# Patient Record
Sex: Female | Born: 1965 | Race: White | Hispanic: No | Smoking: Former smoker
Health system: Southern US, Community
[De-identification: ages and names within clinical notes are randomized; demographics above are authoritative.]

## PROBLEM LIST (undated history)

## (undated) DIAGNOSIS — M5430 Sciatica, unspecified side: Secondary | ICD-10-CM

## (undated) DIAGNOSIS — G4733 Obstructive sleep apnea (adult) (pediatric): Secondary | ICD-10-CM

## (undated) DIAGNOSIS — E785 Hyperlipidemia, unspecified: Secondary | ICD-10-CM

## (undated) DIAGNOSIS — D649 Anemia, unspecified: Secondary | ICD-10-CM

## (undated) DIAGNOSIS — E669 Obesity, unspecified: Secondary | ICD-10-CM

## (undated) DIAGNOSIS — M545 Low back pain, unspecified: Secondary | ICD-10-CM

## (undated) DIAGNOSIS — G43909 Migraine, unspecified, not intractable, without status migrainosus: Secondary | ICD-10-CM

## (undated) DIAGNOSIS — I639 Cerebral infarction, unspecified: Secondary | ICD-10-CM

## (undated) DIAGNOSIS — G8929 Other chronic pain: Secondary | ICD-10-CM

## (undated) DIAGNOSIS — F319 Bipolar disorder, unspecified: Secondary | ICD-10-CM

## (undated) DIAGNOSIS — F329 Major depressive disorder, single episode, unspecified: Secondary | ICD-10-CM

## (undated) DIAGNOSIS — J45909 Unspecified asthma, uncomplicated: Secondary | ICD-10-CM

## (undated) DIAGNOSIS — Z9989 Dependence on other enabling machines and devices: Secondary | ICD-10-CM

## (undated) DIAGNOSIS — E119 Type 2 diabetes mellitus without complications: Secondary | ICD-10-CM

## (undated) DIAGNOSIS — F410 Panic disorder [episodic paroxysmal anxiety] without agoraphobia: Secondary | ICD-10-CM

## (undated) DIAGNOSIS — F32A Depression, unspecified: Secondary | ICD-10-CM

## (undated) DIAGNOSIS — Z9289 Personal history of other medical treatment: Secondary | ICD-10-CM

## (undated) DIAGNOSIS — I10 Essential (primary) hypertension: Secondary | ICD-10-CM

---
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
## 2015-02-06 ENCOUNTER — Encounter (HOSPITAL_COMMUNITY): Payer: Self-pay

## 2015-02-06 ENCOUNTER — Emergency Department (HOSPITAL_COMMUNITY)
Admission: EM | Admit: 2015-02-06 | Discharge: 2015-02-07 | Disposition: A | Discharge status: Home / Self Care | Attending: Emergency Medicine | Admitting: Emergency Medicine

## 2015-02-06 DIAGNOSIS — I10 Essential (primary) hypertension: Secondary | ICD-10-CM | POA: Diagnosis not present

## 2015-02-06 DIAGNOSIS — M545 Low back pain, unspecified: Secondary | ICD-10-CM

## 2015-02-06 DIAGNOSIS — Z8659 Personal history of other mental and behavioral disorders: Secondary | ICD-10-CM | POA: Insufficient documentation

## 2015-02-06 DIAGNOSIS — Z8639 Personal history of other endocrine, nutritional and metabolic disease: Secondary | ICD-10-CM | POA: Insufficient documentation

## 2015-02-06 HISTORY — DX: Major depressive disorder, single episode, unspecified: F32.9

## 2015-02-06 HISTORY — DX: Hyperlipidemia, unspecified: E78.5

## 2015-02-06 HISTORY — DX: Essential (primary) hypertension: I10

## 2015-02-06 HISTORY — DX: Depression, unspecified: F32.A

## 2015-02-06 NOTE — ED Notes (Signed)
Patient reports chronic low back pain, that has worsened over the past two weeks.

## 2015-02-07 MED ORDER — MELOXICAM 15 MG PO TABS: 15.0000 mg | ORAL_TABLET | Freq: Every day | ORAL | Status: DC

## 2015-02-07 MED ORDER — NAPROXEN 500 MG PO TABS
500.0000 mg | ORAL_TABLET | Freq: Once | ORAL | Status: AC
  Administered 2015-02-07: 500 mg via ORAL
  Filled 2015-02-07: qty 1

## 2015-02-07 MED ORDER — METHOCARBAMOL 500 MG PO TABS
500.0000 mg | ORAL_TABLET | Freq: Once | ORAL | Status: AC
  Administered 2015-02-07: 500 mg via ORAL
  Filled 2015-02-07: qty 1

## 2015-02-07 MED ORDER — METHOCARBAMOL 500 MG PO TABS: 500.0000 mg | ORAL_TABLET | Freq: Two times a day (BID) | ORAL | Status: DC

## 2015-02-07 NOTE — ED Provider Notes (Signed)
CSN: [id number]     Arrival date & time 02/06/15  2251 History   First MD Initiated Contact with Patient 02/07/15 0007     Chief Complaint  Patient presents with  . Back Pain     (Consider location/radiation/quality/duration/timing/severity/associated sxs/prior Treatment) HPI Comments: Patient is a 49 year old female with a past medical history of chronic back pain who presents with gradual onset of lower back pain that started this morning. The pain is aching and severe and does not radiate. The pain is constant. Movement makes the pain worse. Nothing makes the pain better. Patient was moving heavy furniture today. Patient has not tried anything for pain. No associated symptoms. No saddle paresthesias or bladder/bowel incontinence.      Past Medical History  Diagnosis Date  . Hypertension   . Depression   . Hyperlipemia    History reviewed. No pertinent past surgical history. No family history on file. History  Substance Use Topics  . Smoking status: Never Smoker   . Smokeless tobacco: Not on file  . Alcohol Use: No   OB History    No data available     Review of Systems  Musculoskeletal: Positive for back pain.  All other systems reviewed and are negative.     Allergies  Ibuprofen  Home Medications   Prior to Admission medications   Not on File   BP 151/92 mmHg  Pulse 110  Temp(Src) 97.7 F (36.5 C) (Oral)  Resp 16  SpO2 95%  LMP 01/14/2015 (Approximate) Physical Exam  Constitutional: She is oriented to person, place, and time. She appears well-developed and well-nourished. No distress.  HENT:  Head: Normocephalic and atraumatic.  Eyes: Conjunctivae are normal.  Cardiovascular: Normal rate and regular rhythm.  Exam reveals no gallop and no friction rub.   No murmur heard. Pulmonary/Chest: Effort normal and breath sounds normal. She has no wheezes. She has no rales. She exhibits no tenderness.  Abdominal: Soft. She exhibits no distension. There is no  tenderness.  Musculoskeletal: Normal range of motion.  No midline spine tenderness to palpation. Bilateral paraspinal lumbar tenderness to palpation.   Neurological: She is alert and oriented to person, place, and time. Coordination normal.  Extremity strength and sensation equal and intact bilaterally. Speech is goal-oriented. Moves limbs without ataxia.   Skin: Skin is warm and dry.  Psychiatric: She has a normal mood and affect. Her behavior is normal.  Nursing note and vitals reviewed.   ED Course  Procedures (including critical care time) Labs Review Labs Reviewed - No data to display  Imaging Review No results found.   EKG Interpretation None      MDM   Final diagnoses:  Bilateral low back pain without sciatica    12:50 AM Patient likely has muscle spasm of lower back from heavy lifting. Vitals stable and patient afebrile. No bladder/bowel incontinence or saddle paresthesias.    Emilia Beck, PA-C 02/07/15 0124  April Palumbo, MD 02/07/15 [phone]

## 2015-02-07 NOTE — Discharge Instructions (Signed)
Take mobic as needed for pain. Take robaxin as needed for muscle spasm. Refer to attached documents for more information. Follow up with your doctor for further evaluation.

## 2015-02-07 NOTE — ED Notes (Signed)
Pt reports chronic low back pain.  Pt states they have been moving. Pt is ambulatory without difficulty.

## 2015-03-31 DIAGNOSIS — Z8639 Personal history of other endocrine, nutritional and metabolic disease: Secondary | ICD-10-CM | POA: Insufficient documentation

## 2015-03-31 DIAGNOSIS — Z79899 Other long term (current) drug therapy: Secondary | ICD-10-CM | POA: Diagnosis not present

## 2015-03-31 DIAGNOSIS — E669 Obesity, unspecified: Secondary | ICD-10-CM | POA: Diagnosis not present

## 2015-03-31 DIAGNOSIS — I10 Essential (primary) hypertension: Secondary | ICD-10-CM | POA: Insufficient documentation

## 2015-03-31 DIAGNOSIS — M545 Low back pain: Secondary | ICD-10-CM | POA: Insufficient documentation

## 2015-03-31 DIAGNOSIS — J45909 Unspecified asthma, uncomplicated: Secondary | ICD-10-CM | POA: Diagnosis not present

## 2015-03-31 DIAGNOSIS — R202 Paresthesia of skin: Secondary | ICD-10-CM | POA: Insufficient documentation

## 2015-03-31 DIAGNOSIS — Z8659 Personal history of other mental and behavioral disorders: Secondary | ICD-10-CM | POA: Diagnosis not present

## 2015-03-31 DIAGNOSIS — Z791 Long term (current) use of non-steroidal anti-inflammatories (NSAID): Secondary | ICD-10-CM | POA: Diagnosis not present

## 2015-04-01 ENCOUNTER — Emergency Department (HOSPITAL_COMMUNITY)
Admission: EM | Admit: 2015-04-01 | Discharge: 2015-04-01 | Disposition: A | Discharge status: Home / Self Care | Attending: Emergency Medicine | Admitting: Emergency Medicine

## 2015-04-01 ENCOUNTER — Encounter (HOSPITAL_COMMUNITY): Payer: Self-pay | Admitting: Emergency Medicine

## 2015-04-01 DIAGNOSIS — M549 Dorsalgia, unspecified: Secondary | ICD-10-CM

## 2015-04-01 HISTORY — DX: Obesity, unspecified: E66.9

## 2015-04-01 HISTORY — DX: Sciatica, unspecified side: M54.30

## 2015-04-01 HISTORY — DX: Unspecified asthma, uncomplicated: J45.909

## 2015-04-01 MED ORDER — OXYCODONE-ACETAMINOPHEN 5-325 MG PO TABS: 1.0000 | ORAL_TABLET | ORAL | Status: DC | PRN

## 2015-04-01 MED ORDER — MORPHINE SULFATE 4 MG/ML IJ SOLN
6.0000 mg | Freq: Once | INTRAMUSCULAR | Status: DC
  Filled 2015-04-01: qty 2

## 2015-04-01 MED ORDER — OXYCODONE-ACETAMINOPHEN 5-325 MG PO TABS
2.0000 | ORAL_TABLET | Freq: Once | ORAL | Status: AC
  Administered 2015-04-01: 2 via ORAL
  Filled 2015-04-01: qty 2

## 2015-04-01 MED ORDER — METHOCARBAMOL 500 MG PO TABS: 500.0000 mg | ORAL_TABLET | Freq: Two times a day (BID) | ORAL | Status: DC

## 2015-04-01 NOTE — ED Notes (Signed)
Pt refusing morphine injection at this time; states "I think I've taken that before and it makes my heart race." PA made aware

## 2015-04-01 NOTE — Discharge Instructions (Signed)
Take the prescribed medication as directed.  Continue supportive care at home (heat, ice, etc) to help with pain. Follow-up with your primary care physician. Return to the ED for new or worsening symptoms.   Emergency Department Resource Guide 1) Find a Doctor and Pay Out of Pocket Although you won't have to find out who is covered by your insurance plan, it is a good idea to ask around and get recommendations. You will then need to call the office and see if the doctor you have chosen will accept you as a new patient and what types of options they offer for patients who are self-pay. Some doctors offer discounts or will set up payment plans for their patients who do not have insurance, but you will need to ask so you aren't surprised when you get to your appointment.  2) Contact Your Local Health Department Not all health departments have doctors that can see patients for sick visits, but many do, so it is worth a call to see if yours does. If you don't know where your local health department is, you can check in your phone book. The CDC also has a tool to help you locate your state's health department, and many state websites also have listings of all of their local health departments.  3) Find a Walk-in Clinic If your illness is not likely to be very severe or complicated, you may want to try a walk in clinic. These are popping up all over the country in pharmacies, drugstores, and shopping centers. They're usually staffed by nurse practitioners or physician assistants that have been trained to treat common illnesses and complaints. They're usually fairly quick and inexpensive. However, if you have serious medical issues or chronic medical problems, these are probably not your best option.  No Primary Care Doctor: - Call Health Connect at  [phone] - they can help you locate a primary care doctor that  accepts your insurance, provides certain services, etc. - Physician Referral Service-  [phone]  Chronic Pain Problems: Organization         Address  Phone   Notes  [facility]  [phone] Patients need to be referred by their primary care doctor.   Medication Assistance: Organization         Address  Phone   Notes  [facility] Medication [facility] [address] [place], [place] [address] [phone] --Must be a resident of [facility] -- Must have NO insurance coverage whatsoever (no Medicaid/ Medicare, etc.) -- The pt. MUST have a primary care doctor that directs their care regularly and follows them in the community   Med[facility]ist  [phone]   [employer]  [phone]    Agencies that provide inexpensive medical care: Organization         Address  Phone   Notes  [facility]  [phone]   [facility]    [phone]   [facility] [address] [place], [place] [address] [phone]   [facility] [address] [place], [place] [phone]   Planned Parenthood    [phone]   [facility]    [phone]   [facility] [employer]  [address], [place] Phone:  [phone], Fax:  [phone] Hours of Operation:  9 am - 6 pm, M-F.  Also accepts Medicaid/Medicare and self-pay.  [facility] for  Children  [address], [place] Phone: [phone], Fax: [phone]. Hours of Operation:  8:30 am - 5:30 pm, M-F.  Also accepts Medicaid and self-pay.  [facility] [place] [address] [place] Phone: [phone]   [place], [place], [place] [phone], Ext. [phone] Mondays & Thursdays: 7-9 AM.  First 15 patients are seen on a first come, first serve basis.    [place] Providers:  Organization         Address  Phone   Notes  [facility] [address],  [place] [phone] Also accepts self-pay patients.  [facility] [id number] [address], [place]  [phone]   [place], [address], [place] [phone]   [facility] [address] [place] [phone]   Lucianne Lei [address], [place]   [phone] Only accepts [place] Access [place] patients after they have their name applied to their card.   Self-Pay (no insurance) in [facility]:  Organization         Address  Phone   Notes  Sickle Cell Patients, [facility] Internal Medicine [place] [phone]   [facility] Urgent Care [place] [phone]   [facility] [place]  [place], [place], [place] [phone]   [facility]/Dr. Osei-Bonsu  [address] [place] or [address], [place] [phone] Phone number for both [place] and [place] locations is the same.  [facility] [address] [place] [phone]   [facility] [address] [place] or [address] [place] [address] [phone]   [facility] [address] [place] [phone], phone; [phone], fax Sees patients 1st and 3rd Saturday of every month.  Must not qualify for public or private insurance (i.e. Medicaid, Medicare, [place] Health Choice, Veterans' Benefits)  Household income should be no more than 200% of the poverty level The clinic cannot treat you if you are pregnant or think you are pregnant  Sexually transmitted diseases are not treated at the clinic.    Dental Care: Organization         Address  Phone  Notes  [facility] Department of [facility] [phone] Accepts children up to age 64 who are enrolled in  [place] or [place]; pregnant women with a Medicaid card; and children who have applied for Medicaid or [place] Health Choice, but were declined, whose parents can pay a reduced fee at time of service.  [facility] Department of [facility]  [address], [place] [phone] Accepts children up to age 59 who are enrolled in [place] or [place]; pregnant women with a Medicaid card; and children who have applied for Medicaid or [place] Health Choice, but were declined, whose parents can pay a reduced fee at time of service.  [facility]  [place] [phone] Patients are seen by appointment only. Walk-ins are not accepted. [place] will see patients 70 years of age and older. Monday - Tuesday (8am-5pm) Most Wednesdays (8:30-5pm) $30 per visit, cash only  [facility] [facility]  [address], [facility] [phone] Patients are seen by appointment only. Walk-ins are not accepted. [place] will see patients 55 years of age and older. One Wednesday Evening (Monthly: Volunteer Based).  $30 per visit, cash only  [place]  [phone] for adults; Children under age 16, call Graduate Pediatric Dentistry at [phone]. Children aged 15-14, please call [phone] to request a pediatric application.  Dental services are provided in all areas of dental care including fillings, crowns and bridges, complete and partial dentures, implants, gum treatment, root canals, and extractions. Preventive care is also provided. Treatment is provided to both adults and children. Patients are selected via a lottery and there is often a waiting list.   [facility] [address] [place]  [phone] [email or web address]   [facility] [address] [place], [place] [phone], Ext. [phone] Second and Fourth Thursday of each month, opens at 6:30  AM; Clinic ends at 9 AM.  Patients are seen on a first-come first-served basis, and a limited number are seen during each clinic.   [facility]  [address] Hillard Danker [place], [place] [phone]   Eligibility Requirements You must have lived in [place], [place], or [place] counties for at least the last three months.   You cannot be eligible for state or federal sponsored [employer], including [employer], [place], or [employer].   You generally cannot be eligible for healthcare insurance through your employer.    How to apply: Eligibility screenings are held every Tuesday and Wednesday afternoon from 1:00 pm until 4:00 pm. You do not need an appointment for the interview!  [facility] [address] [place], [place]   [employer]  [place] Department  [place]  [phone]    Behavioral Health Resources in the Community: Intensive Outpatient Programs Organization         Address  Phone  Notes  [place]. [address], [place], [place] [phone]   [facility] [address], [place], [employer]   ADS: Alcohol & Drug S[facility] [address], [place], [place]   [place] [address]  [place], [place] or [phone]   Substance Abuse Resources Organization         Address  Phone  Notes  Alcohol and Drug Services  [phone]   [place]  [phone]   [facility] [place]   Chinita Pester  [phone]   Residential & Outpatient Substance Abuse Program  [phone]   Psychological Services Organization         Address  Phone  Notes  [facility] [place]  [place]  [phone]   [place] [address] [place] [phone] or  [phone]    Mobile Crisis Teams Organization         Address  Phone  Notes  [facility], Mobile Crisis Care Unit  [phone]   [facility]  [address] [place], [place]   [place] [address] [place] [phone]    Self-Help/Support Groups Organization         Address  Phone             Notes  [facility]ental [facility]oc[facility] of [place] - variety of support groups  [place] Call for more information  Narcotics Anonymous (NA), [facility]aring [facility] [address], [employer] [place]  2 meetings at this location   Special educational needs teacher         Address  Phone  Notes  [facility] [place],    [place]  [phone]   [facility]  [address], [place] [address], [place], [place]   [place], [place] [phone] Admissions: 8am-3pm M-F  Incentives Substance [place] [address],    [place], [place] [phone]   [facility] [address] [place], [place], [place]   [facility] [address],  [place], [place]   [facility] - Intensive Outpatient [address], [place], [place]   [facility] ([place].) [place].,  [place], [place] [phone] or [phone]   [facility] (RTS) [address] [place], [place] Accepts Medicaid  [facility] [place] [address]  [place] [phone] Substance Abuse/Addiction Treatment   [facility] Organization         Address  Phone  Notes  [facility]  [phone]   Domenic Schwab, PhD [address] Arlis Porta [place], [place]   [phone] or [phone]   [place] Per[place], [place] [phone]   [facility] [address],  [place], [place] [phone] Insurance/Medicaid/sponsorship through [facility] and Families [address], [address]                                    [place], [place] [phone] [place] [address], [place] [phone]    Dr. Adele Schilder  [phone]   [facility] Dept. 1) [address] [place] 2) [place] 3)  [place] [address], [place] [phone]  [phone]   [place] [phone] or [phone] (After Hours)

## 2015-04-01 NOTE — ED Notes (Signed)
Pt. reports chronic low back pain ( sciatica) worse this week radiating to right upper leg unrelieved by prescription medication  , denies injury/ambulatory , pt. also reported mild headache .

## 2015-04-01 NOTE — ED Provider Notes (Signed)
CSN: [id number]     Arrival date & time 03/31/15  2356 History   First MD Initiated Contact with Patient 04/01/15 0005     Chief Complaint  Patient presents with  . Back Pain     (Consider location/radiation/quality/duration/timing/severity/associated sxs/prior Treatment) The history is provided by the patient and medical records.    This is a 49 year old female with past medical history significant for hypertension, depression, hyperlipidemia, asthma, sciatica, presenting to the ED for back pain. Patient states pain is worse along her right lower back. She has sharp, shooting pains into her right leg with paresthesias. She denies numbness or weakness. Pain exacerbated with weightbearing and ambulation. Denies new injuries, trauma, or falls.  She states she is having a hard time sleeping at night and getting comfortable due to pain.  She has been taking tramadol and naproxen without relief.  She also applied ice packs to her low back which helped somewhat.  No fever, chills, sweats.  VSS.  Past Medical History  Diagnosis Date  . Hypertension   . Depression   . Hyperlipemia   . Obesity   . Sciatica   . Asthma    History reviewed. No pertinent past surgical history. No family history on file. History  Substance Use Topics  . Smoking status: Never Smoker   . Smokeless tobacco: Not on file  . Alcohol Use: No   OB History    No data available     Review of Systems  Musculoskeletal: Positive for back pain.  All other systems reviewed and are negative.     Allergies  Asa and Ibuprofen  Home Medications   Prior to Admission medications   Medication Sig Start Date End Date Taking? Authorizing Provider  meloxicam (MOBIC) 15 MG tablet Take 1 tablet (15 mg total) by mouth daily. 02/07/15   Kaitlyn Szekalski, PA-C  methocarbamol (ROBAXIN) 500 MG tablet Take 1 tablet (500 mg total) by mouth 2 (two) times daily. 02/07/15   Kaitlyn Szekalski, PA-C   BP 156/80 mmHg  Pulse 106   Temp(Src) 98.1 F (36.7 C) (Oral)  Resp 20  Wt 288 lb (130.636 kg)  SpO2 98%  LMP 03/18/2015   Physical Exam  Constitutional: She is oriented to person, place, and time. She appears well-developed and well-nourished. No distress.  HENT:  Head: Normocephalic and atraumatic.  Mouth/Throat: Oropharynx is clear and moist.  Eyes: Conjunctivae and EOM are normal. Pupils are equal, round, and reactive to light.  Neck: Normal range of motion. Neck supple.  Cardiovascular: Normal rate, regular rhythm and normal heart sounds.   Pulmonary/Chest: Effort normal and breath sounds normal. No respiratory distress. She has no wheezes.  Musculoskeletal: Normal range of motion. She exhibits no edema.  Tenderness of right SI joint without bony deformity; full ROM maintained; + SLR on right, - SLR on left; normal strength and sensation of BLE; normal gait  Neurological: She is alert and oriented to person, place, and time.  Skin: Skin is warm and dry. She is not diaphoretic.  Psychiatric: She has a normal mood and affect.  Nursing note and vitals reviewed.   ED Course  Procedures (including critical care time) Labs Review Labs Reviewed - No data to display  Imaging Review No results found.   EKG Interpretation None      MDM   Final diagnoses:  Back pain, unspecified location   49 year old female here with acute exacerbation of her sciatica. Pain localized to right SI joint with radiation down right leg.  She endorses paresthesias but denies numbness or weakness. She remains ambulatory. She has a positive straight leg raise on right. She has no focal neurologic deficits to suggest cauda equina or other acute spinal pathology.  She apparently reported headache to triage nurse, however this was not mentioned to me during any point of ED visit.  Attempted to give IM injection, however patient refused.  Given dose of percocet here, will d/c home with same.  Encouraged close FU with PCP.  Discussed  plan with patient, he/she acknowledged understanding and agreed with plan of care.  Return precautions given for new or worsening symptoms.  Garlon Hatchet, PA-C 04/01/15 1031  Dione Booze, MD 04/01/15 [phone]

## 2015-06-26 ENCOUNTER — Emergency Department (HOSPITAL_COMMUNITY)
Admission: EM | Admit: 2015-06-26 | Discharge: 2015-06-26 | Disposition: A | Discharge status: Home / Self Care | Attending: Emergency Medicine | Admitting: Emergency Medicine

## 2015-06-26 ENCOUNTER — Encounter (HOSPITAL_COMMUNITY): Payer: Self-pay | Admitting: Emergency Medicine

## 2015-06-26 DIAGNOSIS — E669 Obesity, unspecified: Secondary | ICD-10-CM | POA: Diagnosis not present

## 2015-06-26 DIAGNOSIS — Z8659 Personal history of other mental and behavioral disorders: Secondary | ICD-10-CM | POA: Insufficient documentation

## 2015-06-26 DIAGNOSIS — Z3202 Encounter for pregnancy test, result negative: Secondary | ICD-10-CM | POA: Insufficient documentation

## 2015-06-26 DIAGNOSIS — R51 Headache: Secondary | ICD-10-CM | POA: Diagnosis not present

## 2015-06-26 DIAGNOSIS — I10 Essential (primary) hypertension: Secondary | ICD-10-CM | POA: Insufficient documentation

## 2015-06-26 DIAGNOSIS — J45909 Unspecified asthma, uncomplicated: Secondary | ICD-10-CM | POA: Insufficient documentation

## 2015-06-26 DIAGNOSIS — H538 Other visual disturbances: Secondary | ICD-10-CM | POA: Insufficient documentation

## 2015-06-26 DIAGNOSIS — Z791 Long term (current) use of non-steroidal anti-inflammatories (NSAID): Secondary | ICD-10-CM | POA: Diagnosis not present

## 2015-06-26 DIAGNOSIS — R112 Nausea with vomiting, unspecified: Secondary | ICD-10-CM | POA: Diagnosis not present

## 2015-06-26 DIAGNOSIS — E785 Hyperlipidemia, unspecified: Secondary | ICD-10-CM | POA: Diagnosis not present

## 2015-06-26 DIAGNOSIS — R519 Headache, unspecified: Secondary | ICD-10-CM

## 2015-06-26 DIAGNOSIS — M5431 Sciatica, right side: Secondary | ICD-10-CM | POA: Diagnosis not present

## 2015-06-26 LAB — URINE MICROSCOPIC-ADD ON

## 2015-06-26 LAB — URINALYSIS, ROUTINE W REFLEX MICROSCOPIC
BILIRUBIN URINE: NEGATIVE
Glucose, UA: NEGATIVE mg/dL
Hgb urine dipstick: NEGATIVE
KETONES UR: NEGATIVE mg/dL
NITRITE: NEGATIVE
Protein, ur: NEGATIVE mg/dL
Specific Gravity, Urine: 1.011 (ref 1.005–1.030)
UROBILINOGEN UA: 1 mg/dL (ref 0.0–1.0)
pH: 6 (ref 5.0–8.0)

## 2015-06-26 LAB — PREGNANCY, URINE: PREG TEST UR: NEGATIVE

## 2015-06-26 MED ORDER — METOCLOPRAMIDE HCL 5 MG/ML IJ SOLN: 10.0000 mg | Freq: Once | INTRAMUSCULAR | Status: DC

## 2015-06-26 MED ORDER — PREDNISONE 10 MG PO TABS: ORAL_TABLET | ORAL | Status: DC

## 2015-06-26 MED ORDER — CYCLOBENZAPRINE HCL 10 MG PO TABS: 10.0000 mg | ORAL_TABLET | Freq: Two times a day (BID) | ORAL | Status: DC | PRN

## 2015-06-26 MED ORDER — CYCLOBENZAPRINE HCL 10 MG PO TABS
5.0000 mg | ORAL_TABLET | Freq: Once | ORAL | Status: AC
  Administered 2015-06-26: 5 mg via ORAL
  Filled 2015-06-26: qty 1

## 2015-06-26 MED ORDER — DIPHENHYDRAMINE HCL 50 MG/ML IJ SOLN
25.0000 mg | Freq: Once | INTRAMUSCULAR | Status: AC
  Administered 2015-06-26: 25 mg via INTRAMUSCULAR
  Filled 2015-06-26: qty 1

## 2015-06-26 MED ORDER — KETOROLAC TROMETHAMINE 30 MG/ML IJ SOLN
60.0000 mg | Freq: Once | INTRAMUSCULAR | Status: AC
  Administered 2015-06-26: 60 mg via INTRAMUSCULAR
  Filled 2015-06-26: qty 2

## 2015-06-26 MED ORDER — METOCLOPRAMIDE HCL 5 MG/ML IJ SOLN
10.0000 mg | Freq: Once | INTRAMUSCULAR | Status: AC
  Administered 2015-06-26: 10 mg via INTRAMUSCULAR
  Filled 2015-06-26: qty 2

## 2015-06-26 NOTE — Discharge Instructions (Signed)
Headaches, Frequently Asked Questions °MIGRAINE HEADACHES °Q: What is migraine? What causes it? How can I treat it? °A: Generally, migraine headaches begin as a dull ache. Then they develop into a constant, throbbing, and pulsating pain. You may experience pain at the temples. You may experience pain at the front or back of one or both sides of the head. The pain is usually accompanied by a combination of: °· Nausea. °· Vomiting. °· Sensitivity to light and noise. °Some people (about 15%) experience an aura (see below) before an attack. The cause of migraine is believed to be chemical reactions in the brain. Treatment for migraine may include over-the-counter or prescription medications. It may also include self-help techniques. These include relaxation training and biofeedback.  °Q: What is an aura? °A: About 15% of people with migraine get an "aura". This is a sign of neurological symptoms that occur before a migraine headache. You may see wavy or jagged lines, dots, or flashing lights. You might experience tunnel vision or blind spots in one or both eyes. The aura can include visual or auditory hallucinations (something imagined). It may include disruptions in smell (such as strange odors), taste or touch. Other symptoms include: °· Numbness. °· A "pins and needles" sensation. °· Difficulty in recalling or speaking the correct word. °These neurological events may last as long as 60 minutes. These symptoms will fade as the headache begins. °Q: What is a trigger? °A: Certain physical or environmental factors can lead to or "trigger" a migraine. These include: °· Foods. °· Hormonal changes. °· Weather. °· Stress. °It is important to remember that triggers are different for everyone. To help prevent migraine attacks, you need to figure out which triggers affect you. Keep a headache diary. This is a good way to track triggers. The diary will help you talk to your healthcare professional about your condition. °Q: Does  weather affect migraines? °A: Bright sunshine, hot, humid conditions, and drastic changes in barometric pressure may lead to, or "trigger," a migraine attack in some people. But studies have shown that weather does not act as a trigger for everyone with migraines. °Q: What is the link between migraine and hormones? °A: Hormones start and regulate many of your body's functions. Hormones keep your body in balance within a constantly changing environment. The levels of hormones in your body are unbalanced at times. Examples are during menstruation, pregnancy, or menopause. That can lead to a migraine attack. In fact, about three quarters of all women with migraine report that their attacks are related to the menstrual cycle.  °Q: Is there an increased risk of stroke for migraine sufferers? °A: The likelihood of a migraine attack causing a stroke is very remote. That is not to say that migraine sufferers cannot have a stroke associated with their migraines. In persons under age 40, the most common associated factor for stroke is migraine headache. But over the course of a person's normal life span, the occurrence of migraine headache may actually be associated with a reduced risk of dying from cerebrovascular disease due to stroke.  °Q: What are acute medications for migraine? °A: Acute medications are used to treat the pain of the headache after it has started. Examples over-the-counter medications, NSAIDs, ergots, and triptans.  °Q: What are the triptans? °A: Triptans are the newest class of abortive medications. They are specifically targeted to treat migraine. Triptans are vasoconstrictors. They moderate some chemical reactions in the brain. The triptans work on receptors in your brain. Triptans help   to restore the balance of a neurotransmitter called serotonin. Fluctuations in levels of serotonin are thought to be a main cause of migraine.  °Q: Are over-the-counter medications for migraine effective? °A:  Over-the-counter, or "OTC," medications may be effective in relieving mild to moderate pain and associated symptoms of migraine. But you should see your caregiver before beginning any treatment regimen for migraine.  °Q: What are preventive medications for migraine? °A: Preventive medications for migraine are sometimes referred to as "prophylactic" treatments. They are used to reduce the frequency, severity, and length of migraine attacks. Examples of preventive medications include antiepileptic medications, antidepressants, beta-blockers, calcium channel blockers, and NSAIDs (nonsteroidal anti-inflammatory drugs). °Q: Why are anticonvulsants used to treat migraine? °A: During the past few years, there has been an increased interest in antiepileptic drugs for the prevention of migraine. They are sometimes referred to as "anticonvulsants". Both epilepsy and migraine may be caused by similar reactions in the brain.  °Q: Why are antidepressants used to treat migraine? °A: Antidepressants are typically used to treat people with depression. They may reduce migraine frequency by regulating chemical levels, such as serotonin, in the brain.  °Q: What alternative therapies are used to treat migraine? °A: The term "alternative therapies" is often used to describe treatments considered outside the scope of conventional Western medicine. Examples of alternative therapy include acupuncture, acupressure, and yoga. Another common alternative treatment is herbal therapy. Some herbs are believed to relieve headache pain. Always discuss alternative therapies with your caregiver before proceeding. Some herbal products contain arsenic and other toxins. °TENSION HEADACHES °Q: What is a tension-type headache? What causes it? How can I treat it? °A: Tension-type headaches occur randomly. They are often the result of temporary stress, anxiety, fatigue, or anger. Symptoms include soreness in your temples, a tightening band-like sensation  around your head (a "vice-like" ache). Symptoms can also include a pulling feeling, pressure sensations, and contracting head and neck muscles. The headache begins in your forehead, temples, or the back of your head and neck. Treatment for tension-type headache may include over-the-counter or prescription medications. Treatment may also include self-help techniques such as relaxation training and biofeedback. °CLUSTER HEADACHES °Q: What is a cluster headache? What causes it? How can I treat it? °A: Cluster headache gets its name because the attacks come in groups. The pain arrives with little, if any, warning. It is usually on one side of the head. A tearing or bloodshot eye and a runny nose on the same side of the headache may also accompany the pain. Cluster headaches are believed to be caused by chemical reactions in the brain. They have been described as the most severe and intense of any headache type. Treatment for cluster headache includes prescription medication and oxygen. °SINUS HEADACHES °Q: What is a sinus headache? What causes it? How can I treat it? °A: When a cavity in the bones of the face and skull (a sinus) becomes inflamed, the inflammation will cause localized pain. This condition is usually the result of an allergic reaction, a tumor, or an infection. If your headache is caused by a sinus blockage, such as an infection, you will probably have a fever. An x-ray will confirm a sinus blockage. Your caregiver's treatment might include antibiotics for the infection, as well as antihistamines or decongestants.  °REBOUND HEADACHES °Q: What is a rebound headache? What causes it? How can I treat it? °A: A pattern of taking acute headache medications too often can lead to a condition known as "rebound headache."   A pattern of taking too much headache medication includes taking it more than 2 days per week or in excessive amounts. That means more than the label or a caregiver advises. With rebound  headaches, your medications not only stop relieving pain, they actually begin to cause headaches. Doctors treat rebound headache by tapering the medication that is being overused. Sometimes your caregiver will gradually substitute a different type of treatment or medication. Stopping may be a challenge. Regularly overusing a medication increases the potential for serious side effects. Consult a caregiver if you regularly use headache medications more than 2 days per week or more than the label advises. ADDITIONAL QUESTIONS AND ANSWERS Q: What is biofeedback? A: Biofeedback is a self-help treatment. Biofeedback uses special equipment to monitor your body's involuntary physical responses. Biofeedback monitors:  Breathing.  Pulse.  Heart rate.  Temperature.  Muscle tension.  Brain activity. Biofeedback helps you refine and perfect your relaxation exercises. You learn to control the physical responses that are related to stress. Once the technique has been mastered, you do not need the equipment any more. Q: Are headaches hereditary? A: Four out of five (80%) of people that suffer report a family history of migraine. Scientists are not sure if this is genetic or a family predisposition. Despite the uncertainty, a child has a 50% chance of having migraine if one parent suffers. The child has a 75% chance if both parents suffer.  Q: Can children get headaches? A: By the time they reach high school, most young people have experienced some type of headache. Many safe and effective approaches or medications can prevent a headache from occurring or stop it after it has begun.  Q: What type of doctor should I see to diagnose and treat my headache? A: Start with your primary caregiver. Discuss his or her experience and approach to headaches. Discuss methods of classification, diagnosis, and treatment. Your caregiver may decide to recommend you to a headache specialist, depending upon your symptoms or other  physical conditions. Having diabetes, allergies, etc., may require a more comprehensive and inclusive approach to your headache. The National Head[employer] Foundation will provide, upon request, a list of [facility] physician members in your state. Document Released: 01/22/2004 Document Revised: 01/24/2012 Document Reviewed: 07/01/2008 [facility] Patient Information 2015 [place], [place]. This information is not intended to replace advice given to you by your health care provider. Make sure you discuss any questions you have with your health care provider.  Sciatica Sciatica is pain, weakness, numbness, or tingling along the path of the sciatic nerve. The nerve starts in the lower back and runs down the back of each leg. The nerve controls the muscles in the lower leg and in the back of the knee, while also providing sensation to the back of the thigh, lower leg, and the sole of your foot. Sciatica is a symptom of another medical condition. For instance, nerve damage or certain conditions, such as a herniated disk or bone spur on the spine, pinch or put pressure on the sciatic nerve. This causes the pain, weakness, or other sensations normally associated with sciatica. Generally, sciatica only affects one side of the body. CAUSES   Herniated or slipped disc.  Degenerative disk disease.  A pain disorder involving the narrow muscle in the buttocks (piriformis syndrome).  Pelvic injury or fracture.  Pregnancy.  Tumor (rare). SYMPTOMS  Symptoms can vary from mild to very severe. The symptoms usually travel from the low back to the buttocks and down the back  of the leg. Symptoms can include:  Mild tingling or dull aches in the lower back, leg, or hip.  Numbness in the back of the calf or sole of the foot.  Burning sensations in the lower back, leg, or hip.  Sharp pains in the lower back, leg, or hip.  Leg weakness.  Severe back pain inhibiting movement. These symptoms may get worse with coughing,  sneezing, laughing, or prolonged sitting or standing. Also, being overweight may worsen symptoms. DIAGNOSIS  Your caregiver will perform a physical exam to look for common symptoms of sciatica. He or she may ask you to do certain movements or activities that would trigger sciatic nerve pain. Other tests may be performed to find the cause of the sciatica. These may include:  Blood tests.  X-rays.  Imaging tests, such as an MRI or CT scan. TREATMENT  Treatment is directed at the cause of the sciatic pain. Sometimes, treatment is not necessary and the pain and discomfort goes away on its own. If treatment is needed, your caregiver may suggest:  Over-the-counter medicines to relieve pain.  Prescription medicines, such as anti-inflammatory medicine, muscle relaxants, or narcotics.  Applying heat or ice to the painful area.  Steroid injections to lessen pain, irritation, and inflammation around the nerve.  Reducing activity during periods of pain.  Exercising and stretching to strengthen your abdomen and improve flexibility of your spine. Your caregiver may suggest losing weight if the extra weight makes the back pain worse.  Physical therapy.  Surgery to eliminate what is pressing or pinching the nerve, such as a bone spur or part of a herniated disk. HOME CARE INSTRUCTIONS   Only take over-the-counter or prescription medicines for pain or discomfort as directed by your caregiver.  Apply ice to the affected area for 20 minutes, 3-4 times a day for the first 48-72 hours. Then try heat in the same way.  Exercise, stretch, or perform your usual activities if these do not aggravate your pain.  Attend physical therapy sessions as directed by your caregiver.  Keep all follow-up appointments as directed by your caregiver.  Do not wear high heels or shoes that do not provide proper support.  Check your mattress to see if it is too soft. A firm mattress may lessen your pain and  discomfort. SEEK IMMEDIATE MEDICAL CARE IF:   You lose control of your bowel or bladder (incontinence).  You have increasing weakness in the lower back, pelvis, buttocks, or legs.  You have redness or swelling of your back.  You have a burning sensation when you urinate.  You have pain that gets worse when you lie down or awakens you at night.  Your pain is worse than you have experienced in the past.  Your pain is lasting longer than 4 weeks.  You are suddenly losing weight without reason. MAKE SURE YOU:  Understand these instructions.  Will watch your condition.  Will get help right away if you are not doing well or get worse. Document Released: 10/26/2001 Document Revised: 05/02/2012 Document Reviewed: 03/12/2012 [facility] Patient Information 2015 [place], [place]. This information is not intended to replace advice given to you by your health care provider. Make sure you discuss any questions you have with your health care provider.

## 2015-06-26 NOTE — ED Provider Notes (Signed)
CSN: [id number]     Arrival date & time 06/26/15  1636 History   This chart was scribed for non-physician practitioner, Teressa Lower, NP working with Benjiman Core, MD, by Jarvis Morgan, ED Scribe. This patient was seen in room TR05C/TR05C and the patient's care was started at 4:55 PM.    Chief Complaint  Patient presents with  . Headache  . Back Pain    The history is provided by the patient. No language interpreter was used.    HPI Comments: Nicole [name] is a 49 y.o. female who presents to the Emergency Department complaining of an intermittent, gradually worsening moderate, HA onset 1.5 months. She has a h/o migraine HAs and states this feels similar to previous migraines. Pt reports that her migraine HAs are usually related to her menstrual cycle but states that her LNMP was 06/18/15.She notes associated visual disturbance, photophobia, subjective fever, nausea and vomiting. She states she has an appt with her PCP on August 22nd. Pt states she is non compliant with her medications  She has a second complaint of constant, moderate, low back pain that radiates down her right leg. She has a h/o sciatica. She denies any h/o DM. She denies any urinary/bowel incontinence, numbness or weakness.    Past Medical History  Diagnosis Date  . Hypertension   . Depression   . Hyperlipemia   . Obesity   . Sciatica   . Asthma    History reviewed. No pertinent past surgical history. History reviewed. No pertinent family history. Social History  Substance Use Topics  . Smoking status: Never Smoker   . Smokeless tobacco: None  . Alcohol Use: No   OB History    No data available     Review of Systems  Constitutional: Positive for fever.  Eyes: Positive for photophobia and visual disturbance.  Gastrointestinal: Positive for nausea and vomiting.  Musculoskeletal: Positive for back pain.  Neurological: Positive for headaches. Negative for weakness and numbness.  All other systems  reviewed and are negative.     Allergies  Asa; Ibuprofen; and Morphine and related  Home Medications   Prior to Admission medications   Medication Sig Start Date End Date Taking? Authorizing Provider  meloxicam (MOBIC) 15 MG tablet Take 1 tablet (15 mg total) by mouth daily. 02/07/15   Kaitlyn Szekalski, PA-C  methocarbamol (ROBAXIN) 500 MG tablet Take 1 tablet (500 mg total) by mouth 2 (two) times daily. 04/01/15   Garlon Hatchet, PA-C  oxyCODONE-acetaminophen (PERCOCET/ROXICET) 5-325 MG per tablet Take 1 tablet by mouth every 4 (four) hours as needed. 04/01/15   Garlon Hatchet, PA-C   Triage Vitals: BP 139/78 mmHg  Pulse 103  Temp(Src) 98.3 F (36.8 C) (Oral)  Resp 24  Wt 288 lb (130.636 kg)  SpO2 97%  Physical Exam  Constitutional: She is oriented to person, place, and time. She appears well-developed and well-nourished. No distress.  HENT:  Head: Normocephalic and atraumatic.  Right Ear: External ear normal.  Left Ear: External ear normal.  Mouth/Throat: Oropharynx is clear and moist.  Eyes: Conjunctivae and EOM are normal. Pupils are equal, round, and reactive to light.  Neck: Neck supple. No tracheal deviation present.  Cardiovascular: Normal rate.   Pulmonary/Chest: Effort normal. No respiratory distress.  Abdominal: Soft. Bowel sounds are normal. There is no tenderness.  Musculoskeletal: Normal range of motion.  Left sciatic notch tenderness. Equal strength and sensation to bilateral lower extremities  Neurological: She is alert and oriented to person, place, and  time.  Skin: Skin is warm and dry.  Psychiatric: She has a normal mood and affect. Her behavior is normal.  Nursing note and vitals reviewed.   ED Course  Procedures (including critical care time)  DIAGNOSTIC STUDIES: Oxygen Saturation is 97% on RA, normal by my interpretation.    COORDINATION OF CARE: 5:02 PM- Will order migraine cocktail, UA and u-preg.  Pt advised of plan for treatment and pt  agrees.   Labs Review Labs Reviewed - No data to display  Imaging Review No results found.    EKG Interpretation None      MDM   Final diagnoses:  Headache, unspecified headache type  Sciatica, right   Pt neurologically intact. Not having any red flag symptoms with the back or the headache. Pt feeling better with migraine cocktail  I personally performed the services described in this documentation, which was scribed in my presence. The recorded information has been reviewed and is accurate.       Teressa Lower, NP 06/26/15 1751  Benjiman Core, MD 06/30/15 [phone]

## 2015-06-26 NOTE — ED Notes (Signed)
Pt sts generalized HA and lower back pain; pt sts hx of sciatica with pain down right leg; pt sts hx of migraine and feels same with photophobia

## 2015-08-11 ENCOUNTER — Encounter (HOSPITAL_COMMUNITY): Payer: Self-pay | Admitting: Emergency Medicine

## 2015-08-11 ENCOUNTER — Emergency Department (HOSPITAL_COMMUNITY)
Admission: EM | Admit: 2015-08-11 | Discharge: 2015-08-11 | Disposition: A | Discharge status: Home / Self Care | Attending: Emergency Medicine | Admitting: Emergency Medicine

## 2015-08-11 ENCOUNTER — Emergency Department (HOSPITAL_COMMUNITY)

## 2015-08-11 DIAGNOSIS — M545 Low back pain: Secondary | ICD-10-CM | POA: Diagnosis present

## 2015-08-11 DIAGNOSIS — Z8659 Personal history of other mental and behavioral disorders: Secondary | ICD-10-CM | POA: Insufficient documentation

## 2015-08-11 DIAGNOSIS — G8929 Other chronic pain: Secondary | ICD-10-CM | POA: Insufficient documentation

## 2015-08-11 DIAGNOSIS — J45909 Unspecified asthma, uncomplicated: Secondary | ICD-10-CM | POA: Diagnosis not present

## 2015-08-11 DIAGNOSIS — Z7952 Long term (current) use of systemic steroids: Secondary | ICD-10-CM | POA: Diagnosis not present

## 2015-08-11 DIAGNOSIS — I10 Essential (primary) hypertension: Secondary | ICD-10-CM | POA: Diagnosis not present

## 2015-08-11 DIAGNOSIS — Z79899 Other long term (current) drug therapy: Secondary | ICD-10-CM | POA: Diagnosis not present

## 2015-08-11 DIAGNOSIS — E669 Obesity, unspecified: Secondary | ICD-10-CM | POA: Insufficient documentation

## 2015-08-11 DIAGNOSIS — M5430 Sciatica, unspecified side: Secondary | ICD-10-CM | POA: Diagnosis not present

## 2015-08-11 DIAGNOSIS — R51 Headache: Secondary | ICD-10-CM | POA: Diagnosis not present

## 2015-08-11 DIAGNOSIS — R519 Headache, unspecified: Secondary | ICD-10-CM

## 2015-08-11 DIAGNOSIS — Z791 Long term (current) use of non-steroidal anti-inflammatories (NSAID): Secondary | ICD-10-CM | POA: Insufficient documentation

## 2015-08-11 MED ORDER — HYDROCODONE-ACETAMINOPHEN 5-325 MG PO TABS: 1.0000 | ORAL_TABLET | ORAL | Status: DC | PRN

## 2015-08-11 MED ORDER — KETOROLAC TROMETHAMINE 60 MG/2ML IM SOLN
60.0000 mg | Freq: Once | INTRAMUSCULAR | Status: AC
  Administered 2015-08-11: 60 mg via INTRAMUSCULAR
  Filled 2015-08-11: qty 2

## 2015-08-11 MED ORDER — METOCLOPRAMIDE HCL 5 MG/ML IJ SOLN
10.0000 mg | Freq: Once | INTRAMUSCULAR | Status: AC
  Administered 2015-08-11: 10 mg via INTRAMUSCULAR
  Filled 2015-08-11: qty 2

## 2015-08-11 MED ORDER — OXYCODONE-ACETAMINOPHEN 5-325 MG PO TABS
1.0000 | ORAL_TABLET | Freq: Once | ORAL | Status: AC
  Administered 2015-08-11: 1 via ORAL
  Filled 2015-08-11: qty 1

## 2015-08-11 MED ORDER — DIAZEPAM 5 MG PO TABS
5.0000 mg | ORAL_TABLET | Freq: Once | ORAL | Status: AC
  Administered 2015-08-11: 5 mg via ORAL
  Filled 2015-08-11: qty 1

## 2015-08-11 MED ORDER — CYCLOBENZAPRINE HCL 10 MG PO TABS: 10.0000 mg | ORAL_TABLET | Freq: Three times a day (TID) | ORAL | Status: DC | PRN

## 2015-08-11 NOTE — ED Provider Notes (Signed)
CSN: [id number]     Arrival date & time 08/11/15  0009 History  By signing my name below, I, Freida Busman, attest that this documentation has been prepared under the direction and in the presence of Azalia Bilis, MD . Electronically Signed: Freida Busman, Scribe. 08/11/2015. 4:04 AM.    Chief Complaint  Patient presents with  . Headache  . Sciatica    The history is provided by the patient. No language interpreter was used.   HPI Comments:  Nicole [name] is a 49 y.o.  female with a history of chronic back pain and sciatica for 2-3 years, who presents to the Emergency Department complaining of 10/10 lower back pain that worsened today. She has been applied heating pads and has taken extra strength tylenol without relief.  Pt was seen in the ED in May 2016 for the same and was discharged with percocet which she has run out of.   She also complains of HA; reports a history of the same pain with past migraine HAs.   Pt has a family history of CA on maternal and paternal side.   No PCP   Past Medical History  Diagnosis Date  . Hypertension   . Depression   . Hyperlipemia   . Obesity   . Sciatica   . Asthma    History reviewed. No pertinent past surgical history. History reviewed. No pertinent family history. Social History  Substance Use Topics  . Smoking status: Never Smoker   . Smokeless tobacco: None  . Alcohol Use: No   OB History    No data available     Review of Systems  Musculoskeletal: Positive for back pain.  Neurological: Positive for headaches.  All other systems reviewed and are negative.  Allergies  Asa; Ibuprofen; and Morphine and related  Home Medications   Prior to Admission medications   Medication Sig Start Date End Date Taking? Authorizing Provider  cyclobenzaprine (FLEXERIL) 10 MG tablet Take 1 tablet (10 mg total) by mouth 2 (two) times daily as needed for muscle spasms. 06/26/15   Teressa Lower, NP  meloxicam (MOBIC) 15 MG tablet Take  1 tablet (15 mg total) by mouth daily. 02/07/15   Kaitlyn Szekalski, PA-C  methocarbamol (ROBAXIN) 500 MG tablet Take 1 tablet (500 mg total) by mouth 2 (two) times daily. 04/01/15   Garlon Hatchet, PA-C  oxyCODONE-acetaminophen (PERCOCET/ROXICET) 5-325 MG per tablet Take 1 tablet by mouth every 4 (four) hours as needed. 04/01/15   Garlon Hatchet, PA-C  predniSONE (DELTASONE) 10 MG tablet 6 day stepdown dose 06/26/15   Teressa Lower, NP   BP 144/61 mmHg  Pulse 114  Temp(Src) 98 F (36.7 C) (Oral)  Resp 17  Ht  (1.6 m)  Wt 290 lb (131.543 kg)  BMI 51.38 kg/m2  SpO2 99%  LMP 07/17/2015 (Exact Date) Physical Exam  Constitutional: She is oriented to person, place, and time. She appears well-developed and well-nourished. No distress.  HENT:  Head: Normocephalic and atraumatic.  Eyes: EOM are normal.  Neck: Normal range of motion.  Cardiovascular: Normal rate, regular rhythm and normal heart sounds.   Pulmonary/Chest: Effort normal and breath sounds normal.  Abdominal: Soft. She exhibits no distension. There is no tenderness.  Musculoskeletal: Normal range of motion. She exhibits tenderness.  Mild para lumbar tenderness.   Neurological: She is alert and oriented to person, place, and time. [id number] Skin is warm and dry.  Psychiatric: She has a normal mood and affect. Judgment normal.  Nursing note and vitals reviewed.   ED Course  Procedures   DIAGNOSTIC STUDIES:  Oxygen Saturation is 99% on RA, normal by my interpretation.    COORDINATION OF CARE:  3:55 AM Discussed treatment plan with pt at bedside and pt agreed to plan.  Labs Review Labs Reviewed - No data to display  Imaging Review Dg Lumbar Spine Complete  08/11/2015   CLINICAL DATA:  Low back pain  EXAM: LUMBAR SPINE - COMPLETE 4+ VIEW  COMPARISON:  None.  FINDINGS: There is no evidence of lumbar spine fracture. Alignment is normal. No erosion or evidence of focal bone lesion. Mild endplate spurring without disc  narrowing.  IMPRESSION: No acute finding or significant degenerative change.   Electronically Signed   By: Marnee Spring M.D.   On: 08/11/2015 05:12   I have personally reviewed and evaluated these images and lab results as part of my medical decision-making.   EKG Interpretation None      MDM   Final diagnoses:  None    Patient is overall well-appearing.  She feels much better after symptom management in the emergency department.  Headache is nonspecific.  Patient with long-standing history of chronic back pain.  She's currently not managed by her provider locally.  She does not visit [place] much in the emergency department.  She feels much better.  I'll write for a very short course of some pain medication and muscle relaxants.  I personally performed the services described in this documentation, which was scribed in my presence. The recorded information has been reviewed and is accurate.      Azalia Bilis, MD 08/11/15 [phone]

## 2015-08-11 NOTE — ED Notes (Signed)
Patient here with complaint of migraine and right sciatic pain. States onset of headache 45 days ago. Was seen here in May and was prescribed percocet, but states they didn't help. Has been taking tylenol without effect. Endorses nausea, vomiting, and irritability.

## 2015-08-11 NOTE — ED Notes (Signed)
Patient returned from xray.

## 2015-08-11 NOTE — ED Notes (Signed)
Patient taken to xray.

## 2015-11-16 HISTORY — PX: ENDOMETRIAL ABLATION: SHX621

## 2016-04-24 ENCOUNTER — Emergency Department (HOSPITAL_COMMUNITY)
Admission: EM | Admit: 2016-04-24 | Discharge: 2016-04-24 | Disposition: A | Discharge status: Home / Self Care | Attending: Emergency Medicine | Admitting: Emergency Medicine

## 2016-04-24 ENCOUNTER — Emergency Department (HOSPITAL_COMMUNITY)

## 2016-04-24 ENCOUNTER — Encounter (HOSPITAL_COMMUNITY): Payer: Self-pay

## 2016-04-24 DIAGNOSIS — Z791 Long term (current) use of non-steroidal anti-inflammatories (NSAID): Secondary | ICD-10-CM | POA: Diagnosis not present

## 2016-04-24 DIAGNOSIS — H53149 Visual discomfort, unspecified: Secondary | ICD-10-CM | POA: Insufficient documentation

## 2016-04-24 DIAGNOSIS — M549 Dorsalgia, unspecified: Secondary | ICD-10-CM | POA: Insufficient documentation

## 2016-04-24 DIAGNOSIS — M778 Other enthesopathies, not elsewhere classified: Secondary | ICD-10-CM

## 2016-04-24 DIAGNOSIS — J45909 Unspecified asthma, uncomplicated: Secondary | ICD-10-CM | POA: Diagnosis not present

## 2016-04-24 DIAGNOSIS — G43011 Migraine without aura, intractable, with status migrainosus: Secondary | ICD-10-CM

## 2016-04-24 DIAGNOSIS — G8929 Other chronic pain: Secondary | ICD-10-CM | POA: Insufficient documentation

## 2016-04-24 DIAGNOSIS — E785 Hyperlipidemia, unspecified: Secondary | ICD-10-CM | POA: Diagnosis not present

## 2016-04-24 DIAGNOSIS — G43909 Migraine, unspecified, not intractable, without status migrainosus: Secondary | ICD-10-CM | POA: Diagnosis present

## 2016-04-24 DIAGNOSIS — Z79899 Other long term (current) drug therapy: Secondary | ICD-10-CM | POA: Insufficient documentation

## 2016-04-24 DIAGNOSIS — Z7982 Long term (current) use of aspirin: Secondary | ICD-10-CM | POA: Diagnosis not present

## 2016-04-24 DIAGNOSIS — I10 Essential (primary) hypertension: Secondary | ICD-10-CM | POA: Insufficient documentation

## 2016-04-24 LAB — CBC WITH DIFFERENTIAL/PLATELET
BASOS ABS: 0 10*3/uL (ref 0.0–0.1)
BASOS PCT: 0 %
EOS ABS: 0.3 10*3/uL (ref 0.0–0.7)
Eosinophils Relative: 3 %
HCT: 36.8 % (ref 36.0–46.0)
Hemoglobin: 11.8 g/dL — ABNORMAL LOW (ref 12.0–15.0)
Lymphocytes Relative: 30 %
Lymphs Abs: 2.5 10*3/uL (ref 0.7–4.0)
MCH: 26.3 pg (ref 26.0–34.0)
MCHC: 32.1 g/dL (ref 30.0–36.0)
MCV: 82.1 fL (ref 78.0–100.0)
Monocytes Absolute: 0.4 10*3/uL (ref 0.1–1.0)
Monocytes Relative: 5 %
Neutro Abs: 5.2 10*3/uL (ref 1.7–7.7)
Neutrophils Relative %: 62 %
PLATELETS: 315 10*3/uL (ref 150–400)
RBC: 4.48 MIL/uL (ref 3.87–5.11)
RDW: 14.9 % (ref 11.5–15.5)
WBC: 8.5 10*3/uL (ref 4.0–10.5)

## 2016-04-24 LAB — I-STAT CHEM 8, ED
BUN: 17 mg/dL (ref 6–20)
CALCIUM ION: 1.1 mmol/L — AB (ref 1.12–1.23)
CHLORIDE: 100 mmol/L — AB (ref 101–111)
Creatinine, Ser: 0.7 mg/dL (ref 0.44–1.00)
Glucose, Bld: 106 mg/dL — ABNORMAL HIGH (ref 65–99)
HCT: 37 % (ref 36.0–46.0)
Hemoglobin: 12.6 g/dL (ref 12.0–15.0)
Potassium: 3.1 mmol/L — ABNORMAL LOW (ref 3.5–5.1)
SODIUM: 139 mmol/L (ref 135–145)
TCO2: 28 mmol/L (ref 0–100)

## 2016-04-24 MED ORDER — METOCLOPRAMIDE HCL 5 MG/ML IJ SOLN
10.0000 mg | Freq: Once | INTRAMUSCULAR | Status: AC
  Administered 2016-04-24: 10 mg via INTRAVENOUS
  Filled 2016-04-24: qty 2

## 2016-04-24 MED ORDER — HYDROCODONE-ACETAMINOPHEN 5-325 MG PO TABS
1.0000 | ORAL_TABLET | Freq: Once | ORAL | Status: AC
  Administered 2016-04-24: 1 via ORAL
  Filled 2016-04-24: qty 1

## 2016-04-24 MED ORDER — HYDROCODONE-ACETAMINOPHEN 5-325 MG PO TABS: 1.0000 | ORAL_TABLET | Freq: Four times a day (QID) | ORAL | Status: DC | PRN

## 2016-04-24 MED ORDER — DIPHENHYDRAMINE HCL 50 MG/ML IJ SOLN
25.0000 mg | Freq: Once | INTRAMUSCULAR | Status: AC
  Administered 2016-04-24: 25 mg via INTRAVENOUS
  Filled 2016-04-24: qty 1

## 2016-04-24 MED ORDER — SODIUM CHLORIDE 0.9 % IV BOLUS (SEPSIS)
1000.0000 mL | Freq: Once | INTRAVENOUS | Status: AC
  Administered 2016-04-24: 1000 mL via INTRAVENOUS

## 2016-04-24 MED ORDER — DEXAMETHASONE SODIUM PHOSPHATE 10 MG/ML IJ SOLN
10.0000 mg | Freq: Once | INTRAMUSCULAR | Status: AC
  Administered 2016-04-24: 10 mg via INTRAVENOUS
  Filled 2016-04-24: qty 1

## 2016-04-24 NOTE — ED Provider Notes (Addendum)
CSN: [id number]     Arrival date & time 04/24/16  0213 History   First MD Initiated Contact with Patient 04/24/16 0525     Chief Complaint  Patient presents with  . Migraine  . Elbow Pain  . Back Pain     (Consider location/radiation/quality/duration/timing/severity/associated sxs/prior Treatment) Patient is a 50 y.o. female presenting with migraines and back pain. The history is provided by the patient.  Migraine This is a chronic problem. Episode onset: 3 weeks. The problem occurs constantly. The problem has been gradually worsening. Associated symptoms comments: Nausea, intermittent vomiting and dizziness, lightheadedness. No fever, neck pain or abdominal pain. Similar to migraine she's had in the past. She has taken tramadol at home without improvement. Exacerbated by: Bright light and loud noise. Nothing relieves the symptoms. Treatments tried: Tramadol. The treatment provided no relief.  Back Pain   Past Medical History  Diagnosis Date  . Hypertension   . Depression   . Hyperlipemia   . Obesity   . Sciatica   . Asthma    History reviewed. No pertinent past surgical history. History reviewed. No pertinent family history. Social History  Substance Use Topics  . Smoking status: Never Smoker   . Smokeless tobacco: None  . Alcohol Use: No   OB History    No data available     Review of Systems  Musculoskeletal: Positive for back pain.       Patient states she has chronic back pain and since she's been losing weight and exercising the back pain seems to be worse but better than when she was 30 pounds heavier. She states currently the back pain has seemed to resolve but she has ongoing pain in her left elbow that's been there for 2 weeks. It's worse with movement and is 10 out of 10. It causes radiating pain down into her wrist and hand with numbness in her lateral 3 fingers. She cannot recall any injury but thinks it's from repetitive movements.      Allergies  Asa;  Ibuprofen; and Morphine and related  Home Medications   Prior to Admission medications   Medication Sig Start Date End Date Taking? Authorizing Provider  albuterol (PROVENTIL HFA;VENTOLIN HFA) 108 (90 Base) MCG/ACT inhaler Inhale 2 puffs into the lungs every 6 (six) hours as needed for wheezing or shortness of breath.   Yes Historical Provider, MD  aspirin EC 81 MG tablet Take 81 mg by mouth daily.   Yes Historical Provider, MD  diltiazem (CARDIZEM CD) 240 MG 24 hr capsule Take 240 mg by mouth daily.   Yes Historical Provider, MD  FLUoxetine (PROZAC) 20 MG tablet Take 20 mg by mouth daily.   Yes Historical Provider, MD  hydrochlorothiazide (HYDRODIURIL) 25 MG tablet Take 25 mg by mouth daily.   Yes Historical Provider, MD  Ibuprofen-Famotidine (DUEXIS) 800-26.6 MG TABS Take 1 tablet by mouth at bedtime.   Yes Historical Provider, MD  omeprazole (PRILOSEC) 20 MG capsule Take 20 mg by mouth daily.   Yes Historical Provider, MD  oxaprozin (DAYPRO) 600 MG tablet Take 1,200 mg by mouth daily.   Yes Historical Provider, MD  simvastatin (ZOCOR) 20 MG tablet Take 20 mg by mouth daily.   Yes Historical Provider, MD  traMADol (ULTRAM) 50 MG tablet Take 50 mg by mouth every 6 (six) hours as needed for moderate pain.   Yes Historical Provider, MD   BP 162/91 mmHg  Pulse 95  Temp(Src) 98.1 F (36.7 C) (Oral)  Resp 18  SpO2 98%  LMP 04/22/2016 (Exact Date) Physical Exam  Constitutional: She is oriented to person, place, and time. She appears well-developed and well-nourished. No distress.  HENT:  Head: Normocephalic and atraumatic.  Mouth/Throat: Oropharynx is clear and moist.  Eyes: Conjunctivae and EOM are normal. Pupils are equal, round, and reactive to light.  Fundoscopic exam:      The right eye shows no papilledema.       The left eye shows no papilledema.  Neck: Normal range of motion. Neck supple.  Cardiovascular: Normal rate, regular rhythm, normal heart sounds and intact distal pulses.   Exam reveals no friction rub.   No murmur heard. Pulmonary/Chest: Effort normal and breath sounds normal. No respiratory distress. She has no wheezes. She has no rales.  Abdominal: Soft. Bowel sounds are normal. She exhibits no distension. There is no tenderness. There is no rebound and no guarding.  Musculoskeletal: Normal range of motion. She exhibits tenderness. She exhibits no edema.       Left elbow: Tenderness found. Medial epicondyle and lateral epicondyle tenderness noted.       Left wrist: Normal.  No edema  Lymphadenopathy:    She has no cervical adenopathy.  Neurological: She is alert and oriented to person, place, and time. She has normal strength. No cranial nerve deficit or sensory deficit. Gait normal.  photophobia  Skin: Skin is warm and dry. No rash noted. No erythema.  Psychiatric: She has a normal mood and affect. Her behavior is normal.  Nursing note and vitals reviewed.   ED Course  Procedures (including critical care time) Labs Review Labs Reviewed  CBC WITH DIFFERENTIAL/PLATELET - Abnormal; Notable for the following:    Hemoglobin 11.8 (*)    All other components within normal limits  I-STAT CHEM 8, ED - Abnormal; Notable for the following:    Potassium 3.1 (*)    Chloride 100 (*)    Glucose, Bld 106 (*)    Calcium, Ion 1.10 (*)    All other components within normal limits    Imaging Review Dg Elbow Complete Left  04/24/2016  CLINICAL DATA:  Subacute onset of left elbow pain. Initial encounter. EXAM: LEFT ELBOW - COMPLETE 3+ VIEW COMPARISON:  None. FINDINGS: There is no evidence of fracture or dislocation. The visualized joint spaces are preserved. No significant joint effusion is identified. The soft tissues are unremarkable in appearance. IMPRESSION: No evidence of fracture or dislocation. Electronically Signed   By: Roanna Raider M.D.   On: 04/24/2016 06:04   I have personally reviewed and evaluated these images and lab results as part of my medical  decision-making.   EKG Interpretation None      MDM   Final diagnoses:  Intractable migraine without aura and with status migrainosus  Tendonitis of elbow, left    Pt with typical migraine HA without sx suggestive of SAH(sudden onset, worst of life, or deficits), infection, or cavernous vein thrombosis.  Normal neuro exam and vital signs. Will give HA cocktail and re-eval. Patient's labs without acute findings except for mild hypokalemia.    Secondly patient is complaining of pain in her left elbow that's him an ongoing for the last 2 weeks. It's worse with movement and she thinks she may have injured it while pulling on a door. She does get numbness and tingling down into her fingers concerning for possible tendinitis with ulnar nerve irritation. Plain films pending  6:08 AM Elbow imaging neg.  7:06 AM HA significant improved and pt  d/ced home.   Gwyneth Sprout, MD 04/24/16 1610  Gwyneth Sprout, MD 04/24/16 [id number]

## 2016-04-24 NOTE — ED Notes (Signed)
Pt complaining of left elbow and back pain x month and a half. Pt also complaining of a migraine. Pt has hx of migranes. Pt complaining of photophobia.

## 2016-04-24 NOTE — ED Notes (Signed)
Pt states migraine ongoing x 3 weeks.

## 2016-04-24 NOTE — Discharge Instructions (Signed)
Tendinitis and Tenosynovitis  °Tendinitis is inflammation of the tendon. Tenosynovitis is inflammation of the lining around the tendon (tendon sheath). These painful conditions often occur at once. Tendons attach muscle to bone. To move a limb, force from the muscle moves through the tendon, to the bone. These conditions often cause increased pain when moving. Tendinitis may be caused by a small or partial tear in the tendon.  °SYMPTOMS  °· Pain, tenderness, redness, bruising, or swelling at the injury. °· Loss of normal joint movement. °· Pain that gets worse with use of the muscle and joint attached to the tendon. °· Weakness in the tendon, caused by calcium build up that may occur with tendinitis. °· Commonly affected tendons: °¨ Achilles tendon (calf of leg). °¨ Rotator cuff (shoulder joint). °¨ Patellar tendon (kneecap to shin). °¨ Peroneal tendon (ankle). °¨ Posterior tibial tendon (inner ankle). °¨ Biceps tendon (in front of shoulder). °CAUSES  °· Sudden strain on a flexed muscle, muscle overuse, sudden increase or change in activity, vigorous activity. °· Result of a direct hit (less common). °· Poor muscle action (biomechanics). °RISK INCREASES WITH: °· Injury (trauma). °· Too much exercise. °· Sudden change in athletic activity. °· Incorrect exercise form or technique. °· Poor strength and flexibility. °· Not warming-up properly before activity. °· Returning to activity before healing is complete. °PREVENTION  °· Warm-up and stretch properly before activity. °· Maintain physical fitness: °¨ Joint flexibility. °¨ Muscle strength and endurance. °¨ Fitness that increases heart rate. °· Learn and use proper exercise techniques. °· Use rehabilitation exercises to strengthen weak muscles and tendons. °· Ice the tendon after activity, to reduce recurring inflammation. °· Wear proper fitting protective equipment for specific tendons, when indicated. °PROGNOSIS  °When treated properly, can be cured in 6 to 8 weeks.  Recovery may take longer, depending on degree of injury.  °RELATED COMPLICATIONS  °· Re-injury or recurring symptoms. °· Permanent weakness or joint stiffness, if injury is severe and recovery is not completed. °· Delayed healing, if sports are started before healing is complete. °· Tearing apart (rupture) of the inflamed tendon. Tendinitis means the tendon is injured and must recover. °TREATMENT  °Treatment first involves ice, medicine, and rest from aggravating activities. This reduces pain and inflammation. Modifying your activity may be considered to prevent recurring injury. A brace, elastic bandage wrap, splint, cast, or sling may be prescribed to protect the joint for a short period. After that period, strengthening and stretching exercise may help to regain strength and full range of motion. If the condition persists, despite non-surgical treatment, surgery may be recommended to remove the inflamed tendon lining. Corticosteroid injections may be given to reduce inflammation. However, these injections may weaken the tendon and increase your risk for tendon rupture. °MEDICATION  °· If pain medicine is needed, nonsteroidal anti-inflammatory medicines (aspirin and ibuprofen), or other minor pain relievers (acetaminophen), are often recommended. °· Do not take pain medicine for 7 days before surgery. °· Prescription pain relievers are usually prescribed only after surgery. Use only as directed and only as much as you need. °· Ointments applied to the skin may be helpful. °· Corticosteroid injections may be given to reduce inflammation. However, this may increase your risk of a tendon rupture. °HEAT AND COLD °· Cold treatment (icing) relieves pain and reduces inflammation. Cold treatment should be applied for 10 to 15 minutes every 2 to 3 hours, and immediately after activity that aggravates your symptoms. Use ice packs or an ice massage. °· Heat   treatment may be used before performing stretching and strengthening  activities prescribed by your caregiver, physical therapist, or athletic trainer. Use a heat pack or a warm water soak. SEEK MEDICAL CARE IF:   Symptoms get worse or do not improve, despite treatment.  Pain becomes too much to tolerate.  You develop numbness or tingling.  Toes become cold, or toenails become blue, gray, or dark colored.  New, unexplained symptoms develop. (Drugs used in treatment may produce side effects.)   This information is not intended to replace advice given to you by your health care provider. Make sure you discuss any questions you have with your health care provider.   Document Released: 11/01/2005 Document Revised: 01/24/2012 Document Reviewed: 02/13/2009 Elsevier Interactive Patient Education 2016 [employer].  Migraine Headache A migraine headache is very bad, throbbing pain on one or both sides of your head. Talk to your doctor about what things may bring on (trigger) your migraine headaches. HOME CARE  Only take medicines as told by your doctor.  Lie down in a dark, quiet room when you have a migraine.  Keep a journal to find out if certain things bring on migraine headaches. For example, write down:  What you eat and drink.  How much sleep you get.  Any change to your diet or medicines.  Lessen how much alcohol you drink.  Quit smoking if you smoke.  Get enough sleep.  Lessen any stress in your life.  Keep lights dim if bright lights bother you or make your migraines worse. GET HELP RIGHT AWAY IF:   Your migraine becomes really bad.  You have a fever.  You have a stiff neck.  You have trouble seeing.  Your muscles are weak, or you lose muscle control.  You lose your balance or have trouble walking.  You feel like you will pass out (faint), or you pass out.  You have really bad symptoms that are different than your first symptoms. MAKE SURE YOU:   Understand these instructions.  Will watch your condition.  Will get  help right away if you are not doing well or get worse.   This information is not intended to replace advice given to you by your health care provider. Make sure you discuss any questions you have with your health care provider.   Document Released: 08/10/2008 Document Revised: 01/24/2012 Document Reviewed: 07/09/2013 Elsevier Interactive Patient Education [employer]

## 2016-11-12 ENCOUNTER — Encounter (HOSPITAL_COMMUNITY): Payer: Self-pay | Admitting: Emergency Medicine

## 2016-11-12 ENCOUNTER — Emergency Department (HOSPITAL_COMMUNITY)
Admission: EM | Admit: 2016-11-12 | Discharge: 2016-11-12 | Disposition: A | Discharge status: Home / Self Care | Attending: Emergency Medicine | Admitting: Emergency Medicine

## 2016-11-12 DIAGNOSIS — Z79899 Other long term (current) drug therapy: Secondary | ICD-10-CM | POA: Insufficient documentation

## 2016-11-12 DIAGNOSIS — J45909 Unspecified asthma, uncomplicated: Secondary | ICD-10-CM | POA: Diagnosis not present

## 2016-11-12 DIAGNOSIS — Z7982 Long term (current) use of aspirin: Secondary | ICD-10-CM | POA: Insufficient documentation

## 2016-11-12 DIAGNOSIS — I10 Essential (primary) hypertension: Secondary | ICD-10-CM | POA: Diagnosis not present

## 2016-11-12 DIAGNOSIS — R51 Headache: Secondary | ICD-10-CM | POA: Insufficient documentation

## 2016-11-12 DIAGNOSIS — R519 Headache, unspecified: Secondary | ICD-10-CM

## 2016-11-12 LAB — URINALYSIS, ROUTINE W REFLEX MICROSCOPIC
BILIRUBIN URINE: NEGATIVE
Glucose, UA: NEGATIVE mg/dL
Hgb urine dipstick: NEGATIVE
KETONES UR: NEGATIVE mg/dL
LEUKOCYTES UA: NEGATIVE
NITRITE: NEGATIVE
PH: 6 (ref 5.0–8.0)
Protein, ur: NEGATIVE mg/dL
Specific Gravity, Urine: 1.014 (ref 1.005–1.030)

## 2016-11-12 LAB — POC URINE PREG, ED: PREG TEST UR: NEGATIVE

## 2016-11-12 MED ORDER — OXYCODONE-ACETAMINOPHEN 5-325 MG PO TABS
1.0000 | ORAL_TABLET | Freq: Once | ORAL | Status: AC
Start: 2016-11-12 — End: 2016-11-12
  Administered 2016-11-12: 1 via ORAL
  Filled 2016-11-12: qty 1

## 2016-11-12 MED ORDER — KETOROLAC TROMETHAMINE 30 MG/ML IJ SOLN
30.0000 mg | Freq: Once | INTRAMUSCULAR | Status: AC
  Administered 2016-11-12: 30 mg via INTRAVENOUS
  Filled 2016-11-12: qty 1

## 2016-11-12 MED ORDER — METOCLOPRAMIDE HCL 5 MG/ML IJ SOLN
10.0000 mg | Freq: Once | INTRAMUSCULAR | Status: AC
  Administered 2016-11-12: 10 mg via INTRAVENOUS
  Filled 2016-11-12: qty 2

## 2016-11-12 MED ORDER — HYDROCODONE-ACETAMINOPHEN 5-325 MG PO TABS: 1.0000 | ORAL_TABLET | ORAL | 0 refills | Status: DC | PRN

## 2016-11-12 MED ORDER — SODIUM CHLORIDE 0.9 % IV BOLUS (SEPSIS)
1000.0000 mL | Freq: Once | INTRAVENOUS | Status: AC
  Administered 2016-11-12: 1000 mL via INTRAVENOUS

## 2016-11-12 MED ORDER — DIPHENHYDRAMINE HCL 50 MG/ML IJ SOLN
25.0000 mg | Freq: Once | INTRAMUSCULAR | Status: AC
Start: 2016-11-12 — End: 2016-11-12
  Administered 2016-11-12: 25 mg via INTRAVENOUS
  Filled 2016-11-12: qty 1

## 2016-11-12 NOTE — ED Notes (Signed)
Pt states she has a hx of migraines and has not had any of her medication in 2 weeks. Pt reports a migraine for the past 4 days.

## 2016-11-12 NOTE — ED Provider Notes (Signed)
[facility]-EMERGENCY DEPT Provider Note   CSN: [id number] Arrival date & time: 11/12/16  0041     History   Chief Complaint Chief Complaint  Patient presents with  . Migraine  . Back Pain    HPI Nicole [name] is a 50 y.o. female.  HPI  Patient with chronic back pain, depression and chronic intractable migraines comes to the ER for evaluation for low back pain and migraine. Her pain was 10/10 on arrival and after medication given in triage she is at a 9/10, she says she chronically is never below a 6/10 but that she is functional here. She was seen here within the last few months and had a CT head done in [place] which wasn't normal per patient. She has associated photophobia and phonophobia, headaches always worse on her menstrual cycle. No fever, AMS, weakness, confusion, loss of bowel or bladder control.    Past Medical History:  Diagnosis Date  . Asthma   . Depression   . Hyperlipemia   . Hypertension   . Obesity   . Sciatica     There are no active problems to display for this patient.   History reviewed. No pertinent surgical history.  OB History    No data available       Home Medications    Prior to Admission medications   Medication Sig Start Date End Date Taking? Authorizing Provider  aspirin EC 81 MG tablet Take 81 mg by mouth daily.   Yes Historical Provider, MD  diltiazem (CARDIZEM CD) 240 MG 24 hr capsule Take 240 mg by mouth daily.   Yes Historical Provider, MD  FLUoxetine (PROZAC) 20 MG tablet Take 20 mg by mouth daily.   Yes Historical Provider, MD  hydrochlorothiazide (HYDRODIURIL) 25 MG tablet Take 25 mg by mouth daily.   Yes Historical Provider, MD  Ibuprofen-Famotidine (DUEXIS) 800-26.6 MG TABS Take 1 tablet by mouth 3 (three) times daily as needed (for pain).    Yes Historical Provider, MD  simvastatin (ZOCOR) 20 MG tablet Take 20 mg by mouth daily.   Yes Historical Provider, MD  zolpidem (AMBIEN) 5 MG tablet Take 5 mg by mouth at bedtime as  needed for sleep.   Yes Historical Provider, MD  HYDROcodone-acetaminophen (NORCO/VICODIN) 5-325 MG tablet Take 1 tablet by mouth every 6 (six) hours as needed for severe pain. Patient not taking: Reported on 11/12/2016 04/24/16   Gwyneth Sprout, MD  HYDROcodone-acetaminophen (NORCO/VICODIN) 5-325 MG tablet Take 1 tablet by mouth every 4 (four) hours as needed. 11/12/16   Marlon Pel, PA-C    Family History No family history on file.  Social History Social History  Substance Use Topics  . Smoking status: Never Smoker  . Smokeless tobacco: Never Used  . Alcohol use No     Allergies   Asa [aspirin]; Ibuprofen; and Morphine and related   Review of Systems Review of Systems Review of Systems All other systems negative except as documented in the HPI. All pertinent positives and negatives as reviewed in the HPI.   Physical Exam Updated Vital Signs BP 133/81   Pulse 93   Temp 98.2 F (36.8 C) (Oral)   Resp 19   Ht 5\' 3"  (1.6 m)   Wt 133.2 kg   LMP 10/29/2016 (Approximate)   SpO2 95%   BMI 52.00 kg/m   Physical Exam  Constitutional: She is oriented to person, place, and time. She appears well-developed and well-nourished. No distress.  HENT:  Head: Normocephalic and atraumatic.  Right  Ear: Tympanic membrane and ear canal normal.  Left Ear: Tympanic membrane and ear canal normal.  Nose: Nose normal.  Mouth/Throat: Uvula is midline and oropharynx is clear and moist.  Eyes: Conjunctivae, EOM and lids are normal. Pupils are equal, round, and reactive to light.  Neck: Normal range of motion. Neck supple. No spinous process tenderness and no muscular tenderness present. No neck rigidity. Normal range of motion present. No Brudzinski's sign and no Kernig's sign noted.  Cardiovascular: Normal rate and regular rhythm.   Pulmonary/Chest: Effort normal.  Abdominal: Soft.  Neurological: She is alert and oriented to person, place, and time.  Cranial nerves grossly intact on  exam. Pt alert and oriented x 3 Upper and lower extremity strength is symmetrical and physiologic Normal muscular tone No facial droop Coordination intact, no limb ataxia,No pronator drift  Skin: Skin is warm and dry.  Nursing note and vitals reviewed.    ED Treatments / Results  Labs (all labs ordered are listed, but only abnormal results are displayed) Labs Reviewed  URINALYSIS, ROUTINE W REFLEX MICROSCOPIC  POC URINE PREG, ED    EKG  EKG Interpretation None       Radiology No results found.  Procedures Procedures (including critical care time)  Medications Ordered in ED Medications  sodium chloride 0.9 % bolus 1,000 mL (1,000 mLs Intravenous New Bag/Given 11/12/16 0500)  diphenhydrAMINE (BENADRYL) injection 25 mg (25 mg Intravenous Given 11/12/16 0505)  metoCLOPramide (REGLAN) injection 10 mg (10 mg Intravenous Given 11/12/16 0505)  ketorolac (TORADOL) 30 MG/ML injection 30 mg (30 mg Intravenous Given 11/12/16 0542)  oxyCODONE-acetaminophen (PERCOCET/ROXICET) 5-325 MG per tablet 1 tablet (1 tablet Oral Given 11/12/16 0540)     Initial Impression / Assessment and Plan / ED Course  I have reviewed the triage vital signs and the nursing notes.  Pertinent labs & imaging results that were available during my care of the patient were reviewed by me and considered in my medical decision making (see chart for details).  Clinical Course    Pt HA treated and improved while in ED.  Presentation is like pts typical HA and non [facility], Meningitis, or temporal arteritis. Pt is afebrile with no focal neuro deficits, nuchal rigidity, or change in vision. Pt is to follow up with PCP to discuss prophylactic medication. Pt verbalizes understanding and is agreeable with plan to dc.   Final Clinical Impressions(s) / ED Diagnoses   Final diagnoses:  Bad headache    New Prescriptions New Prescriptions   HYDROCODONE-ACETAMINOPHEN (NORCO/VICODIN) 5-325 MG TABLET     Take 1 tablet by mouth every 4 (four) hours as needed.     Marlon Peliffany Vidyuth Belsito, PA-C 11/12/16 0630    Layla MawKristen N Ward, DO 11/12/16 [id number]

## 2016-11-12 NOTE — ED Triage Notes (Signed)
Patient reports migraine headache for 2 weeks unrelieved by Tramadol and chronic low back pain for several months , denies injury/no urinary discomfort . Pt. stated history of sciatica .

## 2017-10-24 ENCOUNTER — Emergency Department (HOSPITAL_COMMUNITY)
Admission: EM | Admit: 2017-10-24 | Discharge: 2017-10-25 | Disposition: A | Discharge status: Home / Self Care | Attending: Emergency Medicine | Admitting: Emergency Medicine

## 2017-10-24 ENCOUNTER — Other Ambulatory Visit: Payer: Self-pay

## 2017-10-24 ENCOUNTER — Encounter (HOSPITAL_COMMUNITY): Payer: Self-pay | Admitting: Emergency Medicine

## 2017-10-24 DIAGNOSIS — M5441 Lumbago with sciatica, right side: Secondary | ICD-10-CM | POA: Diagnosis not present

## 2017-10-24 DIAGNOSIS — I10 Essential (primary) hypertension: Secondary | ICD-10-CM | POA: Insufficient documentation

## 2017-10-24 DIAGNOSIS — J45909 Unspecified asthma, uncomplicated: Secondary | ICD-10-CM | POA: Diagnosis not present

## 2017-10-24 DIAGNOSIS — F329 Major depressive disorder, single episode, unspecified: Secondary | ICD-10-CM | POA: Insufficient documentation

## 2017-10-24 DIAGNOSIS — G8929 Other chronic pain: Secondary | ICD-10-CM | POA: Diagnosis not present

## 2017-10-24 DIAGNOSIS — M545 Low back pain: Secondary | ICD-10-CM | POA: Diagnosis present

## 2017-10-24 DIAGNOSIS — M25561 Pain in right knee: Secondary | ICD-10-CM | POA: Insufficient documentation

## 2017-10-24 DIAGNOSIS — Z79899 Other long term (current) drug therapy: Secondary | ICD-10-CM | POA: Insufficient documentation

## 2017-10-24 DIAGNOSIS — Z7982 Long term (current) use of aspirin: Secondary | ICD-10-CM | POA: Diagnosis not present

## 2017-10-24 HISTORY — DX: Migraine, unspecified, not intractable, without status migrainosus: G43.909

## 2017-10-24 NOTE — ED Provider Notes (Signed)
[facility] EMERGENCY DEPARTMENT Provider Note   CSN: [id number] Arrival date & time: 10/24/17  2305     History   Chief Complaint Chief Complaint  Patient presents with  . Back Pain    HPI Nicole [name] is a 51 y.o. female.  HPI   Nicole [name] is a 51 y.o. female, with a history of asthma, HTN, migraines, obesity, and sciatica, presenting to the ED with right lower back pain intermittent for the last year.  Pain is shooting into the buttocks and down to the knee, severe.  Accompanied by muscle spasms.  Also complains of pain to the right knee itself.  States she has been under the care of Darnell Level with [facility] for these issues.  Received an intra-articular injection to the right knee on October 3 of this year. Has been using Tylenol without complete relief.  During patient interview she states she also has migraine. Denies fever/chills, trauma/falls, numbness, weakness, changes in bowel or bladder function, saddle anesthesias, joint swelling, or any other complaints.   Past Medical History:  Diagnosis Date  . Asthma   . Depression   . Hyperlipemia   . Hypertension   . Migraines   . Obesity   . Sciatica     There are no active problems to display for this patient.   History reviewed. No pertinent surgical history.  OB History    No data available       Home Medications    Prior to Admission medications   Medication Sig Start Date End Date Taking? Authorizing Provider  aspirin EC 81 MG tablet Take 81 mg by mouth daily.    [provider]  diclofenac sodium (VOLTAREN) 1 % GEL Apply 4 g topically 4 (four) times daily. 10/25/17   Meher Kucinski C, PA-C  diltiazem (CARDIZEM CD) 240 MG 24 hr capsule Take 240 mg by mouth daily.    [provider]  FLUoxetine (PROZAC) 20 MG tablet Take 20 mg by mouth daily.    [provider]  hydrochlorothiazide (HYDRODIURIL) 25 MG tablet Take 25 mg by mouth daily.     [provider]  HYDROcodone-acetaminophen (NORCO/VICODIN) 5-325 MG tablet Take 1 tablet by mouth every 6 (six) hours as needed for severe pain. Patient not taking: Reported on 11/12/2016 04/24/16   Gwyneth Sprout, MD  HYDROcodone-acetaminophen (NORCO/VICODIN) 5-325 MG tablet Take 1 tablet by mouth every 4 (four) hours as needed. 11/12/16   Neva Seat, Tiffany, PA-C  Ibuprofen-Famotidine (DUEXIS) 800-26.6 MG TABS Take 1 tablet by mouth 3 (three) times daily as needed (for pain).     [provider]  lidocaine (LIDODERM) 5 % Place 1 patch onto the skin daily. Remove & Discard patch within 12 hours or as directed by MD 10/25/17   Ernst Cumpston C, PA-C  methocarbamol (ROBAXIN) 500 MG tablet Take 1 tablet (500 mg total) by mouth 2 (two) times daily. 10/25/17   Madlyn Crosby C, PA-C  predniSONE (STERAPRED UNI-PAK 21 TAB) 10 MG (21) TBPK tablet Take 6 tabs (60mg ) on day 1, 5 tabs (50mg ) on day 2, 4 tabs (40mg ) on day 3, 3 tabs (30mg ) on day 4, 2 tabs (20mg ) on day 5, and 1 tab (10mg ) on day 6. 10/25/17   Rome Schlauch C, PA-C  simvastatin (ZOCOR) 20 MG tablet Take 20 mg by mouth daily.    [provider]  zolpidem (AMBIEN) 5 MG tablet Take 5 mg by mouth at bedtime as needed for sleep.  [provider]    Family History No family history on file.  Social History Social History   Tobacco Use  . Smoking status: Never Smoker  . Smokeless tobacco: Never Used  Substance Use Topics  . Alcohol use: No  . Drug use: No     Allergies   Asa [aspirin]; Ibuprofen; and Morphine and related   Review of Systems Review of Systems  Constitutional: Negative for chills, diaphoresis and fever.  Respiratory: Negative for shortness of breath.   Cardiovascular: Negative for chest pain.  Gastrointestinal: Negative for abdominal pain, diarrhea, nausea and vomiting.  Genitourinary: Negative for difficulty urinating.  Musculoskeletal: Positive for arthralgias and back pain. Negative for  joint swelling and neck pain.  Skin: Negative for color change.  Neurological: Negative for weakness and numbness.  All other systems reviewed and are negative.    Physical Exam Updated Vital Signs BP (!) 180/99   Pulse 85   Temp 98.2 F (36.8 C) (Oral)   Resp 18   Ht 5\' 3"  (1.6 m)   SpO2 94%   BMI 52.00 kg/m   Physical Exam  Constitutional: She is oriented to person, place, and time. She appears well-developed and well-nourished. No distress.  HENT:  Head: Normocephalic and atraumatic.  Mouth/Throat: Oropharynx is clear and moist.  Eyes: Conjunctivae and EOM are normal. Pupils are equal, round, and reactive to light.  Neck: Neck supple.  Cardiovascular: Normal rate, regular rhythm, normal heart sounds and intact distal pulses.  Pulmonary/Chest: Effort normal and breath sounds normal. No respiratory distress.  Abdominal: Soft. There is no tenderness. There is no guarding.  Musculoskeletal: She exhibits tenderness. She exhibits no edema or deformity.  Tenderness to the right lumbar musculature into the right buttocks.  No midline spinal tenderness. Tenderness to the anterior right knee without noted deformity, swelling, erythema, laxity, or increased warmth.  Patient allows full range of motion.   Lymphadenopathy:    She has no cervical adenopathy.  Neurological: She is alert and oriented to person, place, and time.  No sensory deficits.  No noted speech deficits. No aphasia. Patient handles oral secretions without difficulty. No noted swallowing defects.  Equal grip strength bilaterally. Strength 5/5 in the upper extremities. Strength 5/5 with flexion and extension of the hips, knees, and ankles bilaterally.  Negative Romberg. Slow, limping gait, but able to ambulate without assistance.  Coordination intact including heel to shin and finger to nose.  Cranial nerves III-XII grossly intact.  No facial droop.   Skin: Skin is warm and dry. Capillary refill takes less than 2  seconds. She is not diaphoretic.  Psychiatric: She has a normal mood and affect. Her behavior is normal.  Nursing note and vitals reviewed.    ED Treatments / Results  Labs (all labs ordered are listed, but only abnormal results are displayed) Labs Reviewed - No data to display  EKG  EKG Interpretation None       Radiology No results found.  Procedures Procedures (including critical care time)  Medications Ordered in ED Medications  ketorolac (TORADOL) injection 60 mg (60 mg Intramuscular Given 10/25/17 0009)  predniSONE (DELTASONE) tablet 60 mg (60 mg Oral Given 10/25/17 0009)  metoCLOPramide (REGLAN) tablet 10 mg (10 mg Oral Given 10/25/17 0009)  methocarbamol (ROBAXIN) tablet 1,000 mg (1,000 mg Oral Given 10/25/17 0008)     Initial Impression / Assessment and Plan / ED Course  I have reviewed the triage vital signs and the nursing notes.  Pertinent labs & imaging  results that were available during my care of the patient were reviewed by me and considered in my medical decision making (see chart for details).     Patient presents with acute on chronic back pain she states is consistent with her history of sciatica.  No noted neurologic deficits.  Knee pain is also chronic.  Low suspicion for septic joint.  Orthopedic follow-up. The patient was given instructions for home care as well as return precautions. Patient voices understanding of these instructions, accepts the plan, and is comfortable with discharge.  Vitals:   10/24/17 2311 10/24/17 2312 10/24/17 2332 10/24/17 2333  BP:  (!) 180/99 (!) 151/89   Pulse:  85  94  Resp:  18    Temp:  98.2 F (36.8 C)    TempSrc:  Oral    SpO2:  94%  95%  Height: 5\' 3"  (1.6 m)        Final Clinical Impressions(s) / ED Diagnoses   Final diagnoses:  Chronic right-sided low back pain with right-sided sciatica  Chronic pain of right knee    ED Discharge Orders        Ordered    methocarbamol (ROBAXIN) 500 MG tablet   2 times daily     10/25/17 0031    lidocaine (LIDODERM) 5 %  Every 24 hours     10/25/17 0031    diclofenac sodium (VOLTAREN) 1 % GEL  4 times daily     10/25/17 0031    predniSONE (STERAPRED UNI-PAK 21 TAB) 10 MG (21) TBPK tablet     10/25/17 0032       Anselm PancoastJoy, Heatherly Stenner C, PA-C 10/25/17 0033    Dione BoozeGlick, David, MD 10/25/17 [phone]

## 2017-10-24 NOTE — ED Triage Notes (Signed)
Pt c/o lower back pain that radiates to the right knee. Pain has been constant x 1 month. States tylenol is not helping. Ambulatory with steady gait.

## 2017-10-25 MED ORDER — METOCLOPRAMIDE HCL 10 MG PO TABS
10.0000 mg | ORAL_TABLET | Freq: Once | ORAL | Status: AC
  Administered 2017-10-25: 10 mg via ORAL
  Filled 2017-10-25: qty 1

## 2017-10-25 MED ORDER — PREDNISONE 20 MG PO TABS
60.0000 mg | ORAL_TABLET | Freq: Once | ORAL | Status: AC
  Administered 2017-10-25: 60 mg via ORAL
  Filled 2017-10-25: qty 3

## 2017-10-25 MED ORDER — METHOCARBAMOL 500 MG PO TABS: 500.0000 mg | ORAL_TABLET | Freq: Two times a day (BID) | ORAL | 0 refills | Status: DC

## 2017-10-25 MED ORDER — METHOCARBAMOL 500 MG PO TABS
1000.0000 mg | ORAL_TABLET | Freq: Once | ORAL | Status: AC
  Administered 2017-10-25: 1000 mg via ORAL
  Filled 2017-10-25: qty 2

## 2017-10-25 MED ORDER — KETOROLAC TROMETHAMINE 60 MG/2ML IM SOLN
60.0000 mg | Freq: Once | INTRAMUSCULAR | Status: AC
  Administered 2017-10-25: 60 mg via INTRAMUSCULAR
  Filled 2017-10-25: qty 2

## 2017-10-25 MED ORDER — DICLOFENAC SODIUM 1 % TD GEL: 4.0000 g | Freq: Four times a day (QID) | TRANSDERMAL | 0 refills | Status: DC

## 2017-10-25 MED ORDER — LIDOCAINE 5 % EX PTCH: 1.0000 | MEDICATED_PATCH | CUTANEOUS | 0 refills | Status: AC

## 2017-10-25 MED ORDER — PREDNISONE 10 MG (21) PO TBPK: ORAL_TABLET | ORAL | 0 refills | Status: DC

## 2017-10-25 NOTE — Discharge Instructions (Signed)
°  Tylenol: Take the Tylenol as needed for pain management.  Your daily total maximum amount of tylenol from all sources should be limited to 4000mg /day for persons without liver problems, or 2000mg /day for those with liver problems. Diclofenac gel: May apply the diclofenac gel, as directed, along with the Tylenol. Prednisone: Prednisone is used to reduce inflammation.  Take this medication, as prescribed, until finished. Muscle relaxer: Robaxin is a muscle relaxer and may help loosen stiff muscles. Do not take the Robaxin while driving or performing other dangerous activities.  Lidocaine patches: These are available via either prescription or over-the-counter. The over-the-counter option may be more economical one and are likely just as effective. There are multiple over-the-counter brands, such as Salonpas. Exercises: Be sure to perform the attached exercises starting with three times a week and working up to performing them daily. This is an essential part of preventing long term problems.   Follow up with your orthopedic specialist for any future management of these complaints.

## 2017-10-25 NOTE — ED Notes (Signed)
Pt and family understood dc material. NAD noted. Scripts given at dc 

## 2017-12-12 ENCOUNTER — Emergency Department (HOSPITAL_COMMUNITY): Admission: EM | Admit: 2017-12-12 | Discharge: 2017-12-12 | Discharge status: Against Medical Advice

## 2017-12-12 ENCOUNTER — Other Ambulatory Visit: Payer: Self-pay

## 2017-12-12 NOTE — ED Notes (Signed)
Pt called for triage no answer triage RN aware

## 2017-12-12 NOTE — ED Notes (Signed)
Spouse came to front desk at 1637 saying, "We're not staying here for no 6h!". Patient was in [facility] and left prior to removing ID bracelet. Spouse pushed patient out. Patient was in ED for < 5 minutes.

## 2017-12-16 DIAGNOSIS — I639 Cerebral infarction, unspecified: Secondary | ICD-10-CM

## 2017-12-16 HISTORY — DX: Cerebral infarction, unspecified: I63.9

## 2017-12-18 ENCOUNTER — Inpatient Hospital Stay (HOSPITAL_COMMUNITY)
Admission: EM | Admit: 2017-12-18 | Discharge: 2017-12-23 | DRG: 064 | Disposition: A | Discharge status: Rehabilitation Facility | Attending: Family Medicine | Admitting: Family Medicine

## 2017-12-18 ENCOUNTER — Encounter (HOSPITAL_COMMUNITY): Payer: Self-pay | Admitting: Emergency Medicine

## 2017-12-18 ENCOUNTER — Emergency Department (HOSPITAL_COMMUNITY)

## 2017-12-18 ENCOUNTER — Other Ambulatory Visit: Payer: Self-pay

## 2017-12-18 DIAGNOSIS — Z886 Allergy status to analgesic agent status: Secondary | ICD-10-CM

## 2017-12-18 DIAGNOSIS — H5500 Unspecified nystagmus: Secondary | ICD-10-CM | POA: Diagnosis present

## 2017-12-18 DIAGNOSIS — G43909 Migraine, unspecified, not intractable, without status migrainosus: Secondary | ICD-10-CM | POA: Diagnosis present

## 2017-12-18 DIAGNOSIS — G911 Obstructive hydrocephalus: Secondary | ICD-10-CM | POA: Diagnosis present

## 2017-12-18 DIAGNOSIS — R278 Other lack of coordination: Secondary | ICD-10-CM | POA: Diagnosis present

## 2017-12-18 DIAGNOSIS — R112 Nausea with vomiting, unspecified: Secondary | ICD-10-CM

## 2017-12-18 DIAGNOSIS — Z7982 Long term (current) use of aspirin: Secondary | ICD-10-CM | POA: Diagnosis not present

## 2017-12-18 DIAGNOSIS — I63532 Cerebral infarction due to unspecified occlusion or stenosis of left posterior cerebral artery: Secondary | ICD-10-CM | POA: Diagnosis not present

## 2017-12-18 DIAGNOSIS — G919 Hydrocephalus, unspecified: Secondary | ICD-10-CM

## 2017-12-18 DIAGNOSIS — G4733 Obstructive sleep apnea (adult) (pediatric): Secondary | ICD-10-CM | POA: Diagnosis present

## 2017-12-18 DIAGNOSIS — Z6841 Body Mass Index (BMI) 40.0 and over, adult: Secondary | ICD-10-CM

## 2017-12-18 DIAGNOSIS — E871 Hypo-osmolality and hyponatremia: Secondary | ICD-10-CM | POA: Diagnosis present

## 2017-12-18 DIAGNOSIS — E785 Hyperlipidemia, unspecified: Secondary | ICD-10-CM | POA: Diagnosis present

## 2017-12-18 DIAGNOSIS — R29701 NIHSS score 1: Secondary | ICD-10-CM | POA: Diagnosis present

## 2017-12-18 DIAGNOSIS — R197 Diarrhea, unspecified: Secondary | ICD-10-CM

## 2017-12-18 DIAGNOSIS — R1310 Dysphagia, unspecified: Secondary | ICD-10-CM | POA: Diagnosis present

## 2017-12-18 DIAGNOSIS — Z885 Allergy status to narcotic agent status: Secondary | ICD-10-CM | POA: Diagnosis not present

## 2017-12-18 DIAGNOSIS — E876 Hypokalemia: Secondary | ICD-10-CM | POA: Diagnosis present

## 2017-12-18 DIAGNOSIS — G936 Cerebral edema: Secondary | ICD-10-CM

## 2017-12-18 DIAGNOSIS — E119 Type 2 diabetes mellitus without complications: Secondary | ICD-10-CM | POA: Diagnosis not present

## 2017-12-18 DIAGNOSIS — W19XXXA Unspecified fall, initial encounter: Secondary | ICD-10-CM | POA: Diagnosis present

## 2017-12-18 DIAGNOSIS — G935 Compression of brain: Secondary | ICD-10-CM | POA: Diagnosis not present

## 2017-12-18 DIAGNOSIS — E878 Other disorders of electrolyte and fluid balance, not elsewhere classified: Secondary | ICD-10-CM | POA: Diagnosis present

## 2017-12-18 DIAGNOSIS — E872 Acidosis: Secondary | ICD-10-CM | POA: Diagnosis present

## 2017-12-18 DIAGNOSIS — Z79899 Other long term (current) drug therapy: Secondary | ICD-10-CM

## 2017-12-18 DIAGNOSIS — Z452 Encounter for adjustment and management of vascular access device: Secondary | ICD-10-CM

## 2017-12-18 DIAGNOSIS — G8929 Other chronic pain: Secondary | ICD-10-CM | POA: Diagnosis not present

## 2017-12-18 DIAGNOSIS — I10 Essential (primary) hypertension: Secondary | ICD-10-CM | POA: Diagnosis not present

## 2017-12-18 DIAGNOSIS — G43809 Other migraine, not intractable, without status migrainosus: Secondary | ICD-10-CM

## 2017-12-18 DIAGNOSIS — J45909 Unspecified asthma, uncomplicated: Secondary | ICD-10-CM | POA: Diagnosis present

## 2017-12-18 DIAGNOSIS — I639 Cerebral infarction, unspecified: Secondary | ICD-10-CM

## 2017-12-18 LAB — COMPREHENSIVE METABOLIC PANEL
ALK PHOS: 105 U/L (ref 38–126)
ALT: 16 U/L (ref 14–54)
AST: 22 U/L (ref 15–41)
Albumin: 3.9 g/dL (ref 3.5–5.0)
Anion gap: 13 (ref 5–15)
BILIRUBIN TOTAL: 0.9 mg/dL (ref 0.3–1.2)
BUN: 9 mg/dL (ref 6–20)
CALCIUM: 9.2 mg/dL (ref 8.9–10.3)
CO2: 21 mmol/L — AB (ref 22–32)
Chloride: 97 mmol/L — ABNORMAL LOW (ref 101–111)
Creatinine, Ser: 0.65 mg/dL (ref 0.44–1.00)
GFR calc Af Amer: >60 mL/min (ref >60)
GFR calc non Af Amer: >60 mL/min (ref >60)
Glucose, Bld: 137 mg/dL — ABNORMAL HIGH (ref 65–99)
Potassium: 3.5 mmol/L (ref 3.5–5.1)
Sodium: 131 mmol/L — ABNORMAL LOW (ref 135–145)
TOTAL PROTEIN: 8.6 g/dL — AB (ref 6.5–8.1)

## 2017-12-18 LAB — CBC WITH DIFFERENTIAL/PLATELET
BASOS ABS: 0 10*3/uL (ref 0.0–0.1)
BASOS PCT: 0 %
EOS PCT: 0 %
Eosinophils Absolute: 0 10*3/uL (ref 0.0–0.7)
HCT: 39.5 % (ref 36.0–46.0)
Hemoglobin: 12.4 g/dL (ref 12.0–15.0)
LYMPHS PCT: 13 %
Lymphs Abs: 1.6 10*3/uL (ref 0.7–4.0)
MCH: 25.2 pg — ABNORMAL LOW (ref 26.0–34.0)
MCHC: 31.4 g/dL (ref 30.0–36.0)
MCV: 80.3 fL (ref 78.0–100.0)
MONOS PCT: 4 %
Monocytes Absolute: 0.6 10*3/uL (ref 0.1–1.0)
Neutro Abs: 10.5 10*3/uL — ABNORMAL HIGH (ref 1.7–7.7)
Neutrophils Relative %: 83 %
PLATELETS: 212 10*3/uL (ref 150–400)
RBC: 4.92 MIL/uL (ref 3.87–5.11)
RDW: 14.8 % (ref 11.5–15.5)
WBC: 12.7 10*3/uL — ABNORMAL HIGH (ref 4.0–10.5)

## 2017-12-18 LAB — URINALYSIS, ROUTINE W REFLEX MICROSCOPIC
BACTERIA UA: NONE SEEN
Bilirubin Urine: NEGATIVE
Glucose, UA: NEGATIVE mg/dL
Ketones, ur: NEGATIVE mg/dL
LEUKOCYTES UA: NEGATIVE
Nitrite: NEGATIVE
PH: 6 (ref 5.0–8.0)
Protein, ur: 100 mg/dL — AB
SPECIFIC GRAVITY, URINE: 1.028 (ref 1.005–1.030)

## 2017-12-18 LAB — I-STAT CG4 LACTIC ACID, ED: LACTIC ACID, VENOUS: 1.06 mmol/L (ref 0.5–1.9)

## 2017-12-18 LAB — I-STAT TROPONIN, ED: TROPONIN I, POC: 0 ng/mL (ref 0.00–0.08)

## 2017-12-18 MED ORDER — LORAZEPAM 2 MG/ML IJ SOLN
0.5000 mg | Freq: Once | INTRAMUSCULAR | Status: AC
  Administered 2017-12-18: 0.5 mg via INTRAVENOUS
  Filled 2017-12-18: qty 1

## 2017-12-18 MED ORDER — SODIUM CHLORIDE 0.9 % IV BOLUS (SEPSIS)
1000.0000 mL | Freq: Once | INTRAVENOUS | Status: AC
  Administered 2017-12-18: 1000 mL via INTRAVENOUS

## 2017-12-18 MED ORDER — DIPHENHYDRAMINE HCL 50 MG/ML IJ SOLN
25.0000 mg | Freq: Once | INTRAMUSCULAR | Status: AC
  Administered 2017-12-18: 25 mg via INTRAVENOUS
  Filled 2017-12-18: qty 1

## 2017-12-18 MED ORDER — KETOROLAC TROMETHAMINE 15 MG/ML IJ SOLN
15.0000 mg | Freq: Once | INTRAMUSCULAR | Status: AC
  Administered 2017-12-18: 15 mg via INTRAVENOUS
  Filled 2017-12-18: qty 1

## 2017-12-18 MED ORDER — METOCLOPRAMIDE HCL 5 MG/ML IJ SOLN
10.0000 mg | Freq: Once | INTRAMUSCULAR | Status: AC
  Administered 2017-12-18: 10 mg via INTRAVENOUS
  Filled 2017-12-18: qty 2

## 2017-12-18 MED ORDER — ONDANSETRON HCL 4 MG/2ML IJ SOLN
4.0000 mg | Freq: Once | INTRAMUSCULAR | Status: AC
  Administered 2017-12-18: 4 mg via INTRAVENOUS
  Filled 2017-12-18: qty 2

## 2017-12-18 NOTE — ED Triage Notes (Signed)
Pt to ED via [facility] with flu like symptoms--started Wednesday-- saw PCP -- given meds for migraine, felt better Wednesday night, worse Thursday-- vomiting/diarrhea also started Wednesday-- vomited x 4 today.

## 2017-12-18 NOTE — ED Notes (Signed)
Pt endorsed pain level 9 headache. RN informed. Pt Pod A V3  appropriate.

## 2017-12-18 NOTE — ED Provider Notes (Signed)
[facility] EMERGENCY DEPARTMENT Provider Note   CSN: [id number] Arrival date & time: 12/18/17  1409     History   Chief Complaint Chief Complaint  Patient presents with  . flu like symptoms  . Headache    HPI Nicole [name] is a 52 y.o. female who presents with a migraine, N/V/D. PMH significant for asthma, obesity, migraines, HTN, HLD. She states she's had a migraine for the past 2 days. She then started to get hot, have chills, and developed N/V/D. She's had multiple episodes of diarrhea. She has been unable to control the vomiting and the headache. She normally takes Tylenol for pain but the headache got worse. She denies URI symptoms, chest pain, SOB, cough, abdominal pain, urinary symptoms. She feels extremely lightheaded/dizzy which prompted her to come to the ED. Her headache feels like a typical migraine. She tried to take Imitrex and Zofran without relief.   History is limited by the patient who is in too much pain to give a detailed history.   HPI  Past Medical History:  Diagnosis Date  . Asthma   . Depression   . Hyperlipemia   . Hypertension   . Migraines   . Obesity   . Sciatica     There are no active problems to display for this patient.   History reviewed. No pertinent surgical history.  OB History    No data available       Home Medications    Prior to Admission medications   Medication Sig Start Date End Date Taking? Authorizing Provider  aspirin EC 81 MG tablet Take 81 mg by mouth daily.    [provider]  diclofenac sodium (VOLTAREN) 1 % GEL Apply 4 g topically 4 (four) times daily. 10/25/17   Joy, Shawn C, PA-C  diltiazem (CARDIZEM CD) 240 MG 24 hr capsule Take 240 mg by mouth daily.    [provider]  FLUoxetine (PROZAC) 20 MG tablet Take 20 mg by mouth daily.    [provider]  hydrochlorothiazide (HYDRODIURIL) 25 MG tablet Take 25 mg by mouth daily.    [provider]    HYDROcodone-acetaminophen (NORCO/VICODIN) 5-325 MG tablet Take 1 tablet by mouth every 6 (six) hours as needed for severe pain. Patient not taking: Reported on 11/12/2016 04/24/16   Gwyneth Sprout, MD  HYDROcodone-acetaminophen (NORCO/VICODIN) 5-325 MG tablet Take 1 tablet by mouth every 4 (four) hours as needed. 11/12/16   Neva Seat, Tiffany, PA-C  Ibuprofen-Famotidine (DUEXIS) 800-26.6 MG TABS Take 1 tablet by mouth 3 (three) times daily as needed (for pain).     [provider]  lidocaine (LIDODERM) 5 % Place 1 patch onto the skin daily. Remove & Discard patch within 12 hours or as directed by MD 10/25/17   Joy, Shawn C, PA-C  methocarbamol (ROBAXIN) 500 MG tablet Take 1 tablet (500 mg total) by mouth 2 (two) times daily. 10/25/17   Joy, Shawn C, PA-C  predniSONE (STERAPRED UNI-PAK 21 TAB) 10 MG (21) TBPK tablet Take 6 tabs (60mg ) on day 1, 5 tabs (50mg ) on day 2, 4 tabs (40mg ) on day 3, 3 tabs (30mg ) on day 4, 2 tabs (20mg ) on day 5, and 1 tab (10mg ) on day 6. 10/25/17   Joy, Shawn C, PA-C  simvastatin (ZOCOR) 20 MG tablet Take 20 mg by mouth daily.    [provider]  zolpidem (AMBIEN) 5 MG tablet Take 5 mg by mouth at bedtime as needed for sleep.  [provider]    Family History No family history on file.  Social History Social History   Tobacco Use  . Smoking status: Never Smoker  . Smokeless tobacco: Never Used  Substance Use Topics  . Alcohol use: No  . Drug use: No     Allergies   Asa [aspirin]; Ibuprofen; and Morphine and related   Review of Systems Review of Systems  Constitutional: Positive for chills. Negative for fever.  HENT: Negative for congestion, rhinorrhea and sore throat.   Respiratory: Negative for cough and shortness of breath.   Cardiovascular: Negative for chest pain.  Gastrointestinal: Positive for diarrhea, nausea and vomiting. Negative for abdominal pain.  Genitourinary: Negative for dysuria.  Neurological: Positive  for light-headedness and headaches. Negative for weakness and numbness.  All other systems reviewed and are negative.    Physical Exam Updated Vital Signs BP 139/86 (BP Location: Right Arm)   Pulse (!) 112   Temp 98.1 F (36.7 C) (Oral)   Resp 19   Ht 5\' 3"  (1.6 m)   Wt 135.2 kg (298 lb)   LMP 12/11/2017 (Exact Date)   SpO2 100%   BMI 52.79 kg/m   Physical Exam  Constitutional: She is oriented to person, place, and time. She appears well-developed and well-nourished. She appears distressed (Moaning in pain).  Rag over head and eyes  HENT:  Head: Normocephalic and atraumatic.  Eyes: Conjunctivae are normal. Pupils are equal, round, and reactive to light. Right eye exhibits no discharge. Left eye exhibits no discharge. No scleral icterus.  Neck: Normal range of motion.  Cardiovascular: Normal rate and regular rhythm. Exam reveals no gallop and no friction rub.  No murmur heard. Pulmonary/Chest: Effort normal and breath sounds normal. No respiratory distress.  Abdominal: Soft. Bowel sounds are normal. She exhibits no distension. There is no tenderness.  Neurological: She is alert and oriented to person, place, and time.  Unable to perform due to pain   Skin: Skin is warm and dry.  Psychiatric: She has a normal mood and affect. Her behavior is normal.  Nursing note and vitals reviewed.    ED Treatments / Results  Labs (all labs ordered are listed, but only abnormal results are displayed) Labs Reviewed  COMPREHENSIVE METABOLIC PANEL - Abnormal; Notable for the following components:      Result Value   Sodium 131 (*)    Chloride 97 (*)    CO2 21 (*)    Glucose, Bld 137 (*)    Total Protein 8.6 (*)    All other components within normal limits  CBC WITH DIFFERENTIAL/PLATELET - Abnormal; Notable for the following components:   WBC 12.7 (*)    MCH 25.2 (*)    Neutro Abs 10.5 (*)    All other components within normal limits  URINALYSIS, ROUTINE W REFLEX MICROSCOPIC -  Abnormal; Notable for the following components:   Color, Urine AMBER (*)    APPearance CLOUDY (*)    Hgb urine dipstick SMALL (*)    Protein, ur 100 (*)    Squamous Epithelial / LPF 6-30 (*)    All other components within normal limits  I-STAT CG4 LACTIC ACID, ED  I-STAT BETA HCG BLOOD, ED ([facility], [facility], [facility] ONLY)  I-STAT TROPONIN, ED    EKG  EKG Interpretation None       Radiology Dg Chest 2 View  Result Date: 12/18/2017 CLINICAL DATA:  Pt to ED via [facility] with flu like symptoms--started Wednesday-- saw PCP -- given meds  for migraine, felt better Wednesday night, worse Thursday-- EXAM: CHEST  2 VIEW COMPARISON:  None. FINDINGS: The heart size and mediastinal contours are within normal limits. Both lungs are clear. The visualized skeletal structures are unremarkable. IMPRESSION: No active cardiopulmonary disease. Electronically Signed   By: Norva Pavlov M.D.   On: 12/18/2017 17:16    Procedures Procedures (including critical care time)  Medications Ordered in ED Medications  sodium chloride 0.9 % bolus 1,000 mL (not administered)  sodium chloride 0.9 % bolus 1,000 mL (0 mLs Intravenous Stopped 12/18/17 2135)  ketorolac (TORADOL) 15 MG/ML injection 15 mg (15 mg Intravenous Given 12/18/17 1817)  metoCLOPramide (REGLAN) injection 10 mg (10 mg Intravenous Given 12/18/17 1817)  diphenhydrAMINE (BENADRYL) injection 25 mg (25 mg Intravenous Given 12/18/17 1818)     Initial Impression / Assessment and Plan / ED Course  I have reviewed the triage vital signs and the nursing notes.  Pertinent labs & imaging results that were available during my care of the patient were reviewed by me and considered in my medical decision making (see chart for details).  52 year old female presents with acute migraine and N/V/D. She is mildly tachycardic but otherwise vitals are normal. Exam is difficult due to patient's pain however she states migraine is typical and abdomen is soft and non-tender. Migraine  cocktail was ordered. CBC is remarkable for mild leukocytosis of 12.7. CMP is remarkable for mild hyponatremia and hypochloremia, consistent with dehydration. Trop is 0. Lactic acid is normal. UA does not appear infected.   7:00 PM: On recheck, the patient is sleeping. She reports her headache is improving.  9:37 PM She feels much better. Asking to go home. She tolerated PO. She was encouraged to follow up with her doctor.  10:08 PM Nursing staff notified me that the patient's symptoms have returned again. Her headache has acutely worsened and she feels generally weak. She still reports symptoms are the same as prior migraines. Will restart IV and give another round of medicines. Care transferred to [facility] who will dispo accordingly.  Final Clinical Impressions(s) / ED Diagnoses   Final diagnoses:  Other migraine without status migrainosus, not intractable  Nausea vomiting and diarrhea    ED Discharge Orders    None       Bethel Born, PA-C 12/18/17 2138    Bethel Born, PA-C 12/19/17 4098    Loren Racer, MD 12/22/17 [phone]

## 2017-12-19 ENCOUNTER — Inpatient Hospital Stay (HOSPITAL_COMMUNITY)

## 2017-12-19 ENCOUNTER — Other Ambulatory Visit (HOSPITAL_COMMUNITY)

## 2017-12-19 ENCOUNTER — Emergency Department (HOSPITAL_COMMUNITY)

## 2017-12-19 ENCOUNTER — Encounter (HOSPITAL_COMMUNITY): Payer: Self-pay

## 2017-12-19 DIAGNOSIS — Z79899 Other long term (current) drug therapy: Secondary | ICD-10-CM | POA: Diagnosis not present

## 2017-12-19 DIAGNOSIS — Z452 Encounter for adjustment and management of vascular access device: Secondary | ICD-10-CM | POA: Diagnosis not present

## 2017-12-19 DIAGNOSIS — G911 Obstructive hydrocephalus: Secondary | ICD-10-CM | POA: Diagnosis present

## 2017-12-19 DIAGNOSIS — R1310 Dysphagia, unspecified: Secondary | ICD-10-CM | POA: Diagnosis present

## 2017-12-19 DIAGNOSIS — R51 Headache: Secondary | ICD-10-CM

## 2017-12-19 DIAGNOSIS — E876 Hypokalemia: Secondary | ICD-10-CM | POA: Diagnosis present

## 2017-12-19 DIAGNOSIS — E119 Type 2 diabetes mellitus without complications: Secondary | ICD-10-CM | POA: Diagnosis present

## 2017-12-19 DIAGNOSIS — R197 Diarrhea, unspecified: Secondary | ICD-10-CM

## 2017-12-19 DIAGNOSIS — W19XXXA Unspecified fall, initial encounter: Secondary | ICD-10-CM | POA: Diagnosis present

## 2017-12-19 DIAGNOSIS — H5500 Unspecified nystagmus: Secondary | ICD-10-CM | POA: Diagnosis present

## 2017-12-19 DIAGNOSIS — Z886 Allergy status to analgesic agent status: Secondary | ICD-10-CM | POA: Diagnosis not present

## 2017-12-19 DIAGNOSIS — R278 Other lack of coordination: Secondary | ICD-10-CM | POA: Diagnosis present

## 2017-12-19 DIAGNOSIS — I10 Essential (primary) hypertension: Secondary | ICD-10-CM | POA: Diagnosis present

## 2017-12-19 DIAGNOSIS — E871 Hypo-osmolality and hyponatremia: Secondary | ICD-10-CM | POA: Diagnosis present

## 2017-12-19 DIAGNOSIS — R112 Nausea with vomiting, unspecified: Secondary | ICD-10-CM | POA: Diagnosis present

## 2017-12-19 DIAGNOSIS — R269 Unspecified abnormalities of gait and mobility: Secondary | ICD-10-CM | POA: Diagnosis not present

## 2017-12-19 DIAGNOSIS — I639 Cerebral infarction, unspecified: Secondary | ICD-10-CM | POA: Diagnosis present

## 2017-12-19 DIAGNOSIS — R2689 Other abnormalities of gait and mobility: Secondary | ICD-10-CM | POA: Diagnosis not present

## 2017-12-19 DIAGNOSIS — G936 Cerebral edema: Secondary | ICD-10-CM | POA: Diagnosis present

## 2017-12-19 DIAGNOSIS — G8929 Other chronic pain: Secondary | ICD-10-CM | POA: Diagnosis present

## 2017-12-19 DIAGNOSIS — G43809 Other migraine, not intractable, without status migrainosus: Secondary | ICD-10-CM | POA: Diagnosis not present

## 2017-12-19 DIAGNOSIS — D72829 Elevated white blood cell count, unspecified: Secondary | ICD-10-CM | POA: Diagnosis not present

## 2017-12-19 DIAGNOSIS — R29701 NIHSS score 1: Secondary | ICD-10-CM | POA: Diagnosis present

## 2017-12-19 DIAGNOSIS — I63532 Cerebral infarction due to unspecified occlusion or stenosis of left posterior cerebral artery: Secondary | ICD-10-CM | POA: Diagnosis present

## 2017-12-19 DIAGNOSIS — G441 Vascular headache, not elsewhere classified: Secondary | ICD-10-CM | POA: Diagnosis not present

## 2017-12-19 DIAGNOSIS — I69398 Other sequelae of cerebral infarction: Secondary | ICD-10-CM | POA: Diagnosis not present

## 2017-12-19 DIAGNOSIS — I5030 Unspecified diastolic (congestive) heart failure: Secondary | ICD-10-CM | POA: Diagnosis not present

## 2017-12-19 DIAGNOSIS — Z6841 Body Mass Index (BMI) 40.0 and over, adult: Secondary | ICD-10-CM | POA: Diagnosis not present

## 2017-12-19 DIAGNOSIS — G43909 Migraine, unspecified, not intractable, without status migrainosus: Secondary | ICD-10-CM | POA: Diagnosis present

## 2017-12-19 DIAGNOSIS — Z7982 Long term (current) use of aspirin: Secondary | ICD-10-CM | POA: Diagnosis not present

## 2017-12-19 DIAGNOSIS — G4733 Obstructive sleep apnea (adult) (pediatric): Secondary | ICD-10-CM | POA: Diagnosis present

## 2017-12-19 DIAGNOSIS — E872 Acidosis: Secondary | ICD-10-CM | POA: Diagnosis present

## 2017-12-19 DIAGNOSIS — E785 Hyperlipidemia, unspecified: Secondary | ICD-10-CM | POA: Diagnosis present

## 2017-12-19 DIAGNOSIS — G935 Compression of brain: Secondary | ICD-10-CM | POA: Diagnosis present

## 2017-12-19 DIAGNOSIS — D62 Acute posthemorrhagic anemia: Secondary | ICD-10-CM | POA: Diagnosis not present

## 2017-12-19 DIAGNOSIS — Z8673 Personal history of transient ischemic attack (TIA), and cerebral infarction without residual deficits: Secondary | ICD-10-CM | POA: Diagnosis not present

## 2017-12-19 DIAGNOSIS — Z885 Allergy status to narcotic agent status: Secondary | ICD-10-CM | POA: Diagnosis not present

## 2017-12-19 DIAGNOSIS — E878 Other disorders of electrolyte and fluid balance, not elsewhere classified: Secondary | ICD-10-CM | POA: Diagnosis present

## 2017-12-19 LAB — MAGNESIUM: Magnesium: 2.1 mg/dL (ref 1.7–2.4)

## 2017-12-19 LAB — HIV ANTIBODY (ROUTINE TESTING W REFLEX): HIV SCREEN 4TH GENERATION: NONREACTIVE

## 2017-12-19 LAB — BASIC METABOLIC PANEL
Anion gap: 10 (ref 5–15)
BUN: 11 mg/dL (ref 6–20)
CALCIUM: 8.1 mg/dL — AB (ref 8.9–10.3)
CO2: 23 mmol/L (ref 22–32)
Chloride: 112 mmol/L — ABNORMAL HIGH (ref 101–111)
Creatinine, Ser: 0.62 mg/dL (ref 0.44–1.00)
GFR calc Af Amer: >60 mL/min (ref >60)
GLUCOSE: 100 mg/dL — AB (ref 65–99)
Potassium: 3.4 mmol/L — ABNORMAL LOW (ref 3.5–5.1)
Sodium: 145 mmol/L (ref 135–145)

## 2017-12-19 LAB — MRSA PCR SCREENING: MRSA by PCR: NEGATIVE

## 2017-12-19 LAB — APTT: aPTT: 27 seconds (ref 24–36)

## 2017-12-19 LAB — LIPID PANEL
CHOL/HDL RATIO: 4.1 ratio
Cholesterol: 140 mg/dL (ref 0–200)
HDL: 34 mg/dL — ABNORMAL LOW (ref >40)
LDL CALC: 85 mg/dL (ref 0–99)
Triglycerides: 107 mg/dL (ref <150)
VLDL: 21 mg/dL (ref 0–40)

## 2017-12-19 LAB — TYPE AND SCREEN
ABO/RH(D): A POS
Antibody Screen: NEGATIVE

## 2017-12-19 LAB — PROTIME-INR
INR: 1.14
Prothrombin Time: 14.5 seconds (ref 11.4–15.2)

## 2017-12-19 LAB — HEMOGLOBIN A1C
Hgb A1c MFr Bld: 6.3 % — ABNORMAL HIGH (ref 4.8–5.6)
MEAN PLASMA GLUCOSE: 134.11 mg/dL

## 2017-12-19 LAB — SODIUM
SODIUM: 141 mmol/L (ref 135–145)
SODIUM: 142 mmol/L (ref 135–145)
Sodium: 135 mmol/L (ref 135–145)

## 2017-12-19 LAB — ABO/RH: ABO/RH(D): A POS

## 2017-12-19 MED ORDER — ATORVASTATIN CALCIUM 40 MG PO TABS: 40.0000 mg | ORAL_TABLET | Freq: Every day | ORAL | Status: DC

## 2017-12-19 MED ORDER — SODIUM CHLORIDE 3 % IV SOLN
INTRAVENOUS | Status: DC
  Administered 2017-12-19: 75 mL/h via INTRAVENOUS
  Filled 2017-12-19 (×2): qty 500

## 2017-12-19 MED ORDER — FLUOXETINE HCL 20 MG PO CAPS
40.0000 mg | ORAL_CAPSULE | Freq: Two times a day (BID) | ORAL | Status: DC
  Administered 2017-12-19 – 2017-12-23 (×9): 40 mg via ORAL
  Filled 2017-12-19 (×9): qty 2

## 2017-12-19 MED ORDER — SODIUM CHLORIDE 3 % IV SOLN: INTRAVENOUS | Status: DC

## 2017-12-19 MED ORDER — DILTIAZEM HCL 60 MG PO TABS
120.0000 mg | ORAL_TABLET | Freq: Four times a day (QID) | ORAL | Status: DC
  Administered 2017-12-19 – 2017-12-23 (×17): 120 mg via ORAL
  Filled 2017-12-19 (×17): qty 2

## 2017-12-19 MED ORDER — FUROSEMIDE 20 MG PO TABS
20.0000 mg | ORAL_TABLET | Freq: Two times a day (BID) | ORAL | Status: DC
  Administered 2017-12-19 – 2017-12-23 (×8): 20 mg via ORAL
  Filled 2017-12-19 (×8): qty 1

## 2017-12-19 MED ORDER — ASPIRIN 300 MG RE SUPP: 300.0000 mg | Freq: Every day | RECTAL | Status: DC

## 2017-12-19 MED ORDER — ASPIRIN EC 81 MG PO TBEC
81.0000 mg | DELAYED_RELEASE_TABLET | Freq: Every day | ORAL | Status: DC
  Administered 2017-12-20 – 2017-12-23 (×4): 81 mg via ORAL
  Filled 2017-12-19 (×5): qty 1

## 2017-12-19 MED ORDER — ALTEPLASE 2 MG IJ SOLR
2.0000 mg | Freq: Once | INTRAMUSCULAR | Status: AC
  Administered 2017-12-19: 2 mg

## 2017-12-19 MED ORDER — IOPAMIDOL (ISOVUE-370) INJECTION 76%
INTRAVENOUS | Status: AC
  Administered 2017-12-19: 50 mL via INTRAVENOUS
  Filled 2017-12-19: qty 50

## 2017-12-19 MED ORDER — KETOROLAC TROMETHAMINE 15 MG/ML IJ SOLN
15.0000 mg | Freq: Once | INTRAMUSCULAR | Status: AC
  Administered 2017-12-19: 15 mg via INTRAVENOUS
  Filled 2017-12-19: qty 1

## 2017-12-19 MED ORDER — CHLORHEXIDINE GLUCONATE CLOTH 2 % EX PADS
6.0000 | MEDICATED_PAD | Freq: Every day | CUTANEOUS | Status: DC
  Administered 2017-12-19 – 2017-12-23 (×3): 6 via TOPICAL

## 2017-12-19 MED ORDER — SODIUM CHLORIDE 0.9% FLUSH
10.0000 mL | Freq: Two times a day (BID) | INTRAVENOUS | Status: DC
  Administered 2017-12-19: 10 mL
  Administered 2017-12-20: 30 mL
  Administered 2017-12-20 – 2017-12-23 (×4): 10 mL

## 2017-12-19 MED ORDER — CLEVIDIPINE BUTYRATE 0.5 MG/ML IV EMUL
0.0000 mg/h | INTRAVENOUS | Status: DC
  Administered 2017-12-19: 1 mg/h via INTRAVENOUS

## 2017-12-19 MED ORDER — ACETAMINOPHEN 325 MG PO TABS
650.0000 mg | ORAL_TABLET | Freq: Four times a day (QID) | ORAL | Status: DC | PRN
  Administered 2017-12-19: 650 mg via ORAL
  Filled 2017-12-19: qty 2

## 2017-12-19 MED ORDER — ACETAMINOPHEN 325 MG PO TABS
650.0000 mg | ORAL_TABLET | ORAL | Status: DC | PRN
  Administered 2017-12-20 – 2017-12-22 (×7): 650 mg via ORAL
  Filled 2017-12-19 (×7): qty 2

## 2017-12-19 MED ORDER — SODIUM CHLORIDE 0.9% FLUSH: 10.0000 mL | INTRAVENOUS | Status: DC | PRN

## 2017-12-19 MED ORDER — SODIUM CHLORIDE 0.9 % IV SOLN: 250.0000 mL | INTRAVENOUS | Status: DC | PRN

## 2017-12-19 MED ORDER — SODIUM CHLORIDE 23.4 % INJECTION (4 MEQ/ML) FOR IV ADMINISTRATION
30.0000 mL | Freq: Once | INTRAVENOUS | Status: AC
  Administered 2017-12-19: 30 mL via INTRAVENOUS
  Filled 2017-12-19: qty 30

## 2017-12-19 MED ORDER — DILTIAZEM HCL ER COATED BEADS 240 MG PO CP24
240.0000 mg | ORAL_CAPSULE | Freq: Two times a day (BID) | ORAL | Status: DC
  Filled 2017-12-19: qty 1

## 2017-12-19 MED ORDER — LORAZEPAM 2 MG/ML IJ SOLN
2.0000 mg | Freq: Once | INTRAMUSCULAR | Status: AC | PRN
  Administered 2017-12-19: 2 mg via INTRAVENOUS
  Filled 2017-12-19: qty 1

## 2017-12-19 MED ORDER — ATORVASTATIN CALCIUM 80 MG PO TABS
80.0000 mg | ORAL_TABLET | Freq: Every day | ORAL | Status: DC
  Administered 2017-12-20 – 2017-12-23 (×4): 80 mg via ORAL
  Filled 2017-12-19 (×4): qty 1

## 2017-12-19 MED ORDER — SODIUM CHLORIDE 3 % IV SOLN
INTRAVENOUS | Status: DC
  Administered 2017-12-19 – 2017-12-21 (×7): 75 mL/h via INTRAVENOUS
  Filled 2017-12-19 (×14): qty 500

## 2017-12-19 MED ORDER — PANTOPRAZOLE SODIUM 40 MG IV SOLR
40.0000 mg | Freq: Every day | INTRAVENOUS | Status: DC
  Administered 2017-12-19 – 2017-12-22 (×4): 40 mg via INTRAVENOUS
  Filled 2017-12-19 (×4): qty 40

## 2017-12-19 MED ORDER — TRAMADOL HCL 50 MG PO TABS
50.0000 mg | ORAL_TABLET | Freq: Four times a day (QID) | ORAL | Status: DC | PRN
  Administered 2017-12-19 – 2017-12-20 (×2): 50 mg via ORAL
  Filled 2017-12-19 (×2): qty 1

## 2017-12-19 MED ORDER — LABETALOL HCL 5 MG/ML IV SOLN
5.0000 mg | INTRAVENOUS | Status: DC | PRN
  Administered 2017-12-19: 5 mg via INTRAVENOUS
  Filled 2017-12-19: qty 4

## 2017-12-19 MED ORDER — HYDROCHLOROTHIAZIDE 25 MG PO TABS
25.0000 mg | ORAL_TABLET | Freq: Every day | ORAL | Status: DC
  Administered 2017-12-19 – 2017-12-23 (×5): 25 mg via ORAL
  Filled 2017-12-19 (×5): qty 1

## 2017-12-19 MED ORDER — LIDOCAINE 5 % EX PTCH
1.0000 | MEDICATED_PATCH | CUTANEOUS | Status: DC
  Filled 2017-12-19: qty 1

## 2017-12-19 MED ORDER — ONDANSETRON HCL 4 MG/2ML IJ SOLN
4.0000 mg | Freq: Four times a day (QID) | INTRAMUSCULAR | Status: DC | PRN
  Administered 2017-12-20: 4 mg via INTRAVENOUS
  Filled 2017-12-19: qty 2

## 2017-12-19 MED ORDER — METOPROLOL SUCCINATE ER 25 MG PO TB24
25.0000 mg | ORAL_TABLET | Freq: Every day | ORAL | Status: DC
  Administered 2017-12-19 – 2017-12-23 (×5): 25 mg via ORAL
  Filled 2017-12-19 (×7): qty 1

## 2017-12-19 MED ORDER — ASPIRIN 300 MG RE SUPP
300.0000 mg | Freq: Every day | RECTAL | Status: DC
  Administered 2017-12-19: 300 mg via RECTAL
  Filled 2017-12-19: qty 1

## 2017-12-19 MED ORDER — LIDOCAINE 5 % EX PTCH
1.0000 | MEDICATED_PATCH | Freq: Every day | CUTANEOUS | Status: DC | PRN
  Filled 2017-12-19: qty 1

## 2017-12-19 MED ORDER — LIDOCAINE HCL (PF) 1 % IJ SOLN
INTRAMUSCULAR | Status: AC
  Filled 2017-12-19: qty 30

## 2017-12-19 NOTE — Progress Notes (Signed)
[place] [facility] Follow-up AM Rounding   BRIEF SUMMARY:  52 yr old female with PMHx significant for Asthma, Depression, HLD, HTN, Migraines, OSA on CPAP presented with complaint of dizziness, s/p fall, nausea, vomiting and diarrhea with a headache that feels like typical migraine. She initially felt better after migraine cocktail but then became dizzy again. 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 Neurology and Neurosurgery aware of patient.  Central line placed and 3% saline initiated.     S:  RN reports pt wakes / unchanged mental status. Repeated CT head this am, unchanged.  Pt reports feeling tired, right neck sore from central line attempts.    O: Blood pressure (!) 153/97, pulse (!) 109, temperature 99.1 F (37.3 C), temperature source Axillary, resp. rate 17, height 5\' 3"  (1.6 m), weight 286 lb 6 oz (129.9 kg), last menstrual period 12/11/2017, SpO2 94 %.  General: morbidly obese female in NAD, lying in bed HEENT: MM pink/dry PSY: calm/appropriate  Neuro: AAOx4, speech clear, MAE  CV: s1s2 rrr, no m/r/g PULM: even/non-labored, lungs bilaterally clear  ZO:XWRU, non-tender, bsx4 active  Extremities: warm/dry, no edema  Skin: no rashes or lesions  Recent Labs  Lab 12/18/17 1416  HGB 12.4  HCT 39.5  WBC 12.7*  PLT 212   Recent Labs  Lab 12/18/17 1416 12/19/17 0354  NA 131* 135  K 3.5  --   CL 97*  --   CO2 21*  --   GLUCOSE 137*  --   BUN 9  --   CREATININE 0.65  --   CALCIUM 9.2  --       A: CVA / Acute Left PICA Occlusion - repeat CT head negative High Risk Decompensation in setting of CVA 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  We formulated the plan together and I elicited the following history.    Subjective: Patient reports no nausea,emesis. Has an occipital headache, improved with Toradol.  Objective: Vitals:   12/19/17 1215 12/19/17 1230 12/19/17 1300 12/19/17 1501  BP: (!) 157/87 (!) 139/91 (!) 141/91 (!) 133/107  Pulse: (!) 103 (!) 104  (!) 121  Resp: 17 (!) 23    Temp:      TempSrc:      SpO2: 100% 100% 97%   Weight:      Height:          Intake/Output Summary (Last 24 hours) at 12/19/2017 1520 Last data filed at 12/19/2017 1400 Gross per 24 hour  Intake 2692.35 ml  Output -  Net 2692.35 ml   General: morbidly obese female in NAD, lying in bed HEENT: MM pink/dry. No nystagmus PSY: calm/appropriate  Neuro: AAOx4, speech clear, MAE  CV: s1s2 rrr, no m/r/g PULM: even/non-labored, lungs bilaterally clear. No crackles.  EA:VWUJ, non-tender, bsx4 active  Extremities: warm/dry, no edema  Skin: no rashes or lesions    CBC    Component Value Date/Time   WBC 12.7 (H) 12/18/2017 1416   RBC 4.92 12/18/2017 1416   HGB 12.4 12/18/2017 1416   HCT 39.5 12/18/2017 1416   PLT 212 12/18/2017 1416   MCV 80.3 12/18/2017 1416   MCH 25.2 (L) 12/18/2017 1416   MCHC 31.4 12/18/2017 1416   RDW 14.8 12/18/2017 1416   LYMPHSABS 1.6 12/18/2017 1416   MONOABS 0.6 12/18/2017 1416   EOSABS 0.0 12/18/2017 1416   BASOSABS 0.0 12/18/2017 1416    BMET    Component Value Date/Time   NA 145 12/19/2017 1115   K 3.4 (L) 12/19/2017 1115   CL 112 (H) 12/19/2017 1115   CO2 23 12/19/2017 1115   GLUCOSE 100 (H) 12/19/2017 1115   BUN 11 12/19/2017 1115   CREATININE 0.62 12/19/2017 1115   CALCIUM  8.1 (L) 12/19/2017 1115   GFRNONAA >60 12/19/2017 1115   GFRAA >60 12/19/2017 1115    Impression/Plan:  CVA / Acute Left PICA Occlusion - repeat CT head negative High Risk Decompensation in setting of CVA 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 Started on 300mg  of asa. Monitor BP closely, if SBP > 140 start cleviprex  Aspiration precautions Continue CPAP QHS for a history of OSA.  Await ECHO Trend BMP / urinary output Replace electrolytes as indicated Avoid nephrotoxic agents, ensure adequate renal perfusion Follow up BMP, CBC in am   My cc time 30 minutes  Elayne Snare, MD [place] [facility] Pager: [phone]    Canary Brim, NP-C [facility] Pgr: [phone] or if no answer [phone] 12/19/2017, 11:38 AM

## 2017-12-19 NOTE — Progress Notes (Signed)
Reexamined at 6:35 AM. There is increased drowsiness from prior exam. Still able to recite the months of the year and with symmetric strength x 4. Not on sedating medications.   Obtaining STAT CT head. Have signed out to Dr. Amada Jupiter. Stroke team to follow in the AM.   Electronically signed: Dr. Caryl Pina

## 2017-12-19 NOTE — Progress Notes (Addendum)
SLP Cancellation Note  Patient Details Name: Nicole [name] MRN: [id number] DOB: 1966/02/18   Cancelled treatment:       Reason Eval/Treat Not Completed: Patient at procedure or test/unavailable. Pt off unit for multiple attempts. Will continue efforts. Pt is NPO at this time.   Zowie Lundahl B. Murvin [facility], CCC-SLP Speech Language Pathologist [phone]  Leigh [place] 12/19/2017, 12:38 PM, 13:30 PM 2:26 PM

## 2017-12-19 NOTE — Consult Note (Signed)
Referring Physician: Dr. Ellender Hose    Chief Complaint: Large cerebellar infarction with mass effect  HPI: Nicole [name] is an 52 y.o. female who presented to the ED with severe occipital headache, N/V/D. Her PMHx is significant for asthma, obesity, migraines, HTN and HLD. She stated to the ED staff that she has had a migraine headache for the past 2 days. Further interview with neurology reveals that her symptoms began on Wednesday evening while eating dinner; she had abrupt onset of vertigo with nausea and vomiting; concomitantly, she developed a severe occipital headache, worse on the left. Denies neck pain and no preceding trauma or rapid neck extension, flexion or rotation. Since than, she has been unable to control the vomiting and the headache. She normally takes Tylenol for pain but the headache got worse. She denied to the ED staff URI symptoms, chest pain, SOB, cough, abdominal pain or urinary symptoms. She endorsed feeling extremely lightheaded. She stated to the ED staff that her headache felt like a typical migraine. She tried to take Imitrex and Zofran at home without relief.   Headache pain in the ED was rated as 9/10. After a migraine cocktail and IVF, she reported that her headache was improving and asked to go home. However, at time of d/c she stated that she continued to have dizziness and felt like she could not go home yet. A second bag of IV fluids was ordered, following which the patient again reported feeling better and stated that she would like to go home. At that time she tried to stand and walk and stated that the back of her head was with severe throbbing pain and she felt too dizzy to walk. She then reported that she had a fall 2 days ago and hit the back of her head with possible LOC. She also stated at that time that her presenting symptoms began after the fall.   A CT head was then obtained, revealing a large cerebellar infarction with mass effect on the brainstem and  initial stages of herniation. CTA head showed occluded left PICA and severe stenosis of her right PICA secondary to dissection versus vasospasm or atherosclerosis. A left V4 thromboembolism with moderate stenosis, versus dissection was also noted.    CT HEAD: 1. 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 Additional acute small bilateral cerebellar infarcts. 2. Cerebellar edema resulting in inferior tonsillar herniation and early obstructive hydrocephalus.  CTA HEAD: 1. Occluded LEFT PICA. Severe stenosis RIGHT PICA, secondary to dissection, less likely vaso spasm or atherosclerosis. 2. LEFT V4 thromboembolism resulting in moderate stenosis, dissection not excluded. 3. 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 4. Recommend CTA NECK.  LSN: Wednesday evening tPA Given: No: Out of the time window  Past Medical History:  Diagnosis Date  . Asthma   . Depression   . Hyperlipemia   . Hypertension   . Migraines   . Obesity   . Sciatica     History reviewed. No pertinent surgical history.  No family history on file. Social History:  reports that  has never smoked. she has never used smokeless tobacco. She reports that she does not drink alcohol or use drugs.  Allergies:  Allergies  Allergen Reactions  . Asa [Aspirin] Nausea Only  . Ibuprofen Nausea Only  . Morphine And Related Palpitations    Home Medications:    ED Medications: Scheduled: . pantoprazole (PROTONIX) IV  40 mg Intravenous QHS   Continuous: . sodium chloride    .  sodium chloride (hypertonic) 75 mL/hr (12/19/17 0401)   DVV:OHYWVP chloride, ondansetron (ZOFRAN) IV  ROS: Severe occipital headache. Other ROS as per HPI.   Physical Examination: Blood pressure (!) 155/98, pulse (!) 115, temperature 98.9 F (37.2 C), temperature source Oral, resp. rate 20, height '5\' 3"'  (1.6 m), weight 135.2 kg (298 lb), last menstrual period 12/11/2017, SpO2  97 %.  General: Morbidly obese HEENT: TTP occipital region. Perry/AT Lungs: Respirations unlabored Ext: No edema  Neurologic Examination: (performed at 3:30 AM) Mental Status: Pained affect. Eyes closed due to nausea but alerts to voice normally. No drowsiness noted. Fully oriented. No dysarthria. Speech fluent with intact comprehension and naming. Able to comprehend all questions accurately. Normal insight, becoming anxious and transiently tearful when informed of her diagnosis. Concentration intact with ability to perform simple addition and accurately recite months of the year in order.  Cranial Nerves: II:  Visual fields intact to left and right. PERRL at 3 mm >> 2 mm.   III,IV, VI: Drift of eyes to right with prominent fast beating horizontal nystagmus to the left;  rotatory component present with upgaze. Able to cross midline to the left. Horzontal nystagmus is present in all directions of gaze, but decreases with downgaze.   V,VII: Smile symmetric, facial temp sensation decreased on the left VIII: hearing intact to voice IX,X: Unable to visualize palate due to severe nausea XI: Symmetric XII: Unable to assess tongue protrusion due to severe nausea Motor: Performed in right lateral decubitus position due to severe nausea triggered by supine positioning.  Equal strength in all 4 limbs.  Maximum strength elicited in upper extremities is 4/5 proximally and distally in the context of nausea, vertigo and severe occipital headache. No asymmetry.  Knee extension and ADF/APF 4+/5 bilateral lower extremities without asymmetry.  Sensory: Temp decreased to LUE. Temp sensation normal to RUE and bilateral lower extremities. FT intact x 4 without extinction.   Deep Tendon Reflexes:  1+ left biceps and brachioradialis.  2+ right biceps and brachioradialis 2+ left patella 1+ right patella 2+ bilateral achilles Toes downgoing bilaterally Cerebellar: Dysmetria with slow FNF bilaterally. Unable to  perform heel-shin due to severe nausea triggered while in supine position Gait: Unable to assess  Results for orders placed or performed during the hospital encounter of 12/18/17 (from the past 48 hour(s))  Comprehensive metabolic panel     Status: Abnormal   Collection Time: 12/18/17  2:16 PM  Result Value Ref Range   Sodium 131 (L) 135 - 145 mmol/L   Potassium 3.5 3.5 - 5.1 mmol/L   Chloride 97 (L) 101 - 111 mmol/L   CO2 21 (L) 22 - 32 mmol/L   Glucose, Bld 137 (H) 65 - 99 mg/dL   BUN 9 6 - 20 mg/dL   Creatinine, Ser 0.65 0.44 - 1.00 mg/dL   Calcium 9.2 8.9 - 10.3 mg/dL   Total Protein 8.6 (H) 6.5 - 8.1 g/dL   Albumin 3.9 3.5 - 5.0 g/dL   AST 22 15 - 41 U/L   ALT 16 14 - 54 U/L   Alkaline Phosphatase 105 38 - 126 U/L   Total Bilirubin 0.9 0.3 - 1.2 mg/dL   GFR calc non Af Amer >60 >60 mL/min   GFR calc Af Amer >60 >60 mL/min    Comment: (NOTE) The eGFR has been calculated using the CKD EPI equation. This calculation has not been validated in all clinical situations. eGFR's persistently <60 mL/min signify possible Chronic Kidney Disease.  Anion gap 13 5 - 15    Comment: Performed at [facility] [address], [place], [place] [address]  CBC with Differential     Status: Abnormal   Collection Time: 12/18/17  2:16 PM  Result Value Ref Range   WBC 12.7 (H) 4.0 - 10.5 K/uL   RBC 4.92 3.87 - 5.11 MIL/uL   Hemoglobin 12.4 12.0 - 15.0 g/dL   HCT 39.5 36.0 - 46.0 %   MCV 80.3 78.0 - 100.0 fL   MCH 25.2 (L) 26.0 - 34.0 pg   MCHC 31.4 30.0 - 36.0 g/dL   RDW 14.8 11.5 - 15.5 %   Platelets 212 150 - 400 K/uL   Neutrophils Relative % 83 %   Neutro Abs 10.5 (H) 1.7 - 7.7 K/uL   Lymphocytes Relative 13 %   Lymphs Abs 1.6 0.7 - 4.0 K/uL   Monocytes Relative 4 %   Monocytes Absolute 0.6 0.1 - 1.0 K/uL   Eosinophils Relative 0 %   Eosinophils Absolute 0.0 0.0 - 0.7 K/uL   Basophils Relative 0 %   Basophils Absolute 0.0 0.0 - 0.1 K/uL    Comment: Performed at [facility] [address], [place], [place] [address]  I-stat troponin, ED     Status: None   Collection Time: 12/18/17  2:47 PM  Result Value Ref Range   Troponin i, poc 0.00 0.00 - 0.08 ng/mL   Comment 3            Comment: Due to the release kinetics of cTnI, a negative result within the first hours of the onset of symptoms does not rule out myocardial infarction with certainty. If myocardial infarction is still suspected, repeat the test at appropriate intervals.   I-Stat CG4 Lactic Acid, ED     Status: None   Collection Time: 12/18/17  2:49 PM  Result Value Ref Range   Lactic Acid, Venous 1.06 0.5 - 1.9 mmol/L  Urinalysis, Routine w reflex microscopic     Status: Abnormal   Collection Time: 12/18/17  2:55 PM  Result Value Ref Range   Color, Urine AMBER (A) YELLOW    Comment: BIOCHEMICALS MAY BE AFFECTED BY COLOR   APPearance CLOUDY (A) CLEAR   Specific Gravity, Urine 1.028 1.005 - 1.030   pH 6.0 5.0 - 8.0   Glucose, UA NEGATIVE NEGATIVE mg/dL   Hgb urine dipstick SMALL (A) NEGATIVE   Bilirubin Urine NEGATIVE NEGATIVE   Ketones, ur NEGATIVE NEGATIVE mg/dL   Protein, ur 100 (A) NEGATIVE mg/dL   Nitrite NEGATIVE NEGATIVE   Leukocytes, UA NEGATIVE NEGATIVE   RBC / HPF 6-30 0 - 5 RBC/hpf   WBC, UA 6-30 0 - 5 WBC/hpf   Bacteria, UA NONE SEEN NONE SEEN   Squamous Epithelial / LPF 6-30 (A) NONE SEEN   Mucus PRESENT     Comment: Performed at [facility], [address], [place], [place] [address]   Ct Angio Head W Or Wo Contrast  Result Date: 12/19/2017 CLINICAL DATA:  Dizziness, fall 2 days ago with possible loss of consciousness. EXAM: CT ANGIOGRAPHY HEAD TECHNIQUE: Multidetector CT imaging of the head was performed using the standard protocol during bolus administration of intravenous contrast. Multiplanar CT image reconstructions and MIPs were obtained to evaluate the vascular anatomy. CONTRAST:  83m ISOVUE-370 IOPAMIDOL (ISOVUE-370) INJECTION 76% COMPARISON:  None.  FINDINGS: CT HEAD BRAIN: Large LEFT inferior cerebellar acute to subacute infarct. Additional small bilateral cerebellar infarcts.  Cytotoxic edema resulting and inferior cerebellar tonsil herniation, effaced cerebral spinal fluid at the foramen magnum. Very mild prominence of the supratentorial ventricles. No intraparenchymal hemorrhage. No abnormal extra-axial fluid collections. VASCULAR: Unremarkable. SKULL/SOFT TISSUES: No skull fracture. No significant soft tissue swelling. ORBITS/SINUSES: The included ocular globes and orbital contents are normal.The mastoid aircells and included paranasal sinuses are well-aerated. OTHER: None. CTA HEAD-noisy image quality. ANTERIOR CIRCULATION: Patent cervical internal carotid arteries, petrous, cavernous and supra clinoid internal carotid arteries. Patent anterior communicating artery with 4 mm origin aneurysm versus infundibulum. Patent anterior and middle cerebral arteries. 5 mm bilobed LEFT MCA bifurcation. No large vessel occlusion, significant stenosis, contrast extravasation. POSTERIOR CIRCULATION: Tandem severe stenosis RIGHT posterior-inferior cerebellar artery, non demonstrable LEFT posterior-inferior cerebellar artery. RIGHT distal V4 segment occluded. Central filling defect LEFT mid V4 segment with moderate stenosis. Patent basilar artery. Robust bilateral posterior communicating arteries, patent posterior cerebral arteries. No contrast extravasation or aneurysm. VENOUS SINUSES: Major dural venous sinuses are patent though not tailored for evaluation on this angiographic examination. ANATOMIC VARIANTS: None. DELAYED PHASE: Not performed. MIP images reviewed. IMPRESSION: CT HEAD: 1. 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 Additional acute small bilateral cerebellar infarcts. 2. Cerebellar edema resulting in inferior tonsillar herniation and early obstructive hydrocephalus. CTA HEAD: 1. Occluded LEFT PICA. Severe  stenosis RIGHT PICA, secondary to dissection, less likely vaso spasm or atherosclerosis. 2. LEFT V4 thromboembolism resulting in moderate stenosis, dissection not excluded. 3. 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 4. Recommend CTA NECK. Critical Value/emergent results were called by telephone at the time of interpretation on 12/19/2017 at 2:10 am to PA. Montine Circle , who verbally acknowledged these results. Electronically Signed   By: Elon Alas M.D.   On: 12/19/2017 02:18   Dg Chest 2 View  Result Date: 12/18/2017 CLINICAL DATA:  Pt to ED via [facility] with flu like symptoms--started Wednesday-- saw PCP -- given meds for migraine, felt better Wednesday night, worse Thursday-- EXAM: CHEST  2 VIEW COMPARISON:  None. FINDINGS: The heart size and mediastinal contours are within normal limits. Both lungs are clear. The visualized skeletal structures are unremarkable. IMPRESSION: No active cardiopulmonary disease. Electronically Signed   By: Nolon Nations M.D.   On: 12/18/2017 17:16    Assessment: 52 y.o. female presenting with large left cerebellar and patchy right cerebellar subacute ischemic infarctions with mass effect upon the 4th ventricle and medulla. 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 CTA head shows occluded LEFT PICA, LEFT V4 thromboembolism resulting in moderate stenosis with dissection not excluded, and severe stenosis of RIGHT PICA, secondary to dissection versus vasospasm or atherosclerosis. 1. At risk for sudden decompensation and death from acute noncommunicating hydrocephalus and/or brainstem compression/herniation 2. Neurological examination reveals drift of eyes to right with prominent fast beating nystagmus to the left;  rotatory component present with upgaze. Pupils are equal and reactive. Equal strength in all 4 limbs. Mentation intact at time of ED evaluation (3:30 AM) with normal orientation, normal insight, no dysarthria or dysphasia, concentration  intact with ability to perform simple addition and accurately recite months of the year in order.  3. Discussed need for STAT consult with Neurosurgeon Dr. Ronnald Ramp who has reviewed the CT images. He does not feel that an emergent craniectomy or ventriculostomy is indicated unless her exam worsens. 4. Discussed with nursing staff and EDP regarding the most pertinent exam findings to monitor to assess for possible Neurological worsening. I have also discussed the case with  CCM attending.  5. Stroke Risk Factors - HLD and HTN  Plan: 1. Hypertonic 3% saline infusion at 75 cc/hr 2. Will need central line as 23% saline will need to be administered if her Neurological exam worsens.  3. Unable to perform MRI currently as patient with severe nausea and vertigo, at risk for vomiting with aspiration in MRI 4. Head of bed 30-45 degrees. 5. BP management. If pressure goes above 140, would start clevidipine gtt as she is at high risk for hemorrhagic conversion of the large left cerebellar stroke. Not a candidate for permissive HTN as stroke most likely occurred 4 days ago 6. Repeat CT head in 12 hours from initial CT (ordered) 7. Will need to be monitored in an ICU setting 8. Frequent neuro checks. Neurosurgery to be called STAT if her Neurological exam worsens. Discussed with Neurosurgery.  9. TTE 10. Cannot anticoagulate for the dissection seen on CTA as she is at high risk for hemorrhagic conversion of the cerebellar strokes with acute decompensation 11. Start ASA 300 mg suppository, first dose now. Has ASA allergy but with nausea only and benefits for prevention of additional strokes significantly outweigh risks 12. Hold off on Plavix as unable to take PO and this may result in uncontrollable bleeding if emergent suboccipital craniectomy or ventriculostomy is indicated 13. Telemetry monitoring 14. Not felt to be stable for CTA neck at this time. Obtain when able.  15. Headache pain and nausea management with  IV Phenergan. May need an opiate but will need to observe closely for respiratory depression.  16. May need NGT.   60 minutes spent in the emergent Neurological evaluation and management of this critically ill patient.   '@Electronically'  signed: Dr. Kerney Elbe  12/19/2017, 3:00 AM

## 2017-12-19 NOTE — Progress Notes (Signed)
Upon attempt to draw lab work for q6 Na lab draw - unable to get blood return from any lumen on central line. Called phlebotomy to come and draw Na lab and notified IV team of possible complication with central line.   Will get Na draw ASAP and proceed as necessary.  Francia Greaves, RN

## 2017-12-19 NOTE — ED Notes (Signed)
Pt. Return from CT via stretcher. 

## 2017-12-19 NOTE — ED Provider Notes (Signed)
THIS IS A SHARED VISIT WITH KELLY GEKAS, PAC.   Nicole [name] is a 52 y.o. female here tonight for headache, n/v/d. The headache is typical of her usual migraines. Terance Hart, PAC completed the patient's  evaluation and treatment but at time of d/c she stated that she continued to have dizziness and felt like she could not go home yet. Second bag of IV fluids ordered for patient.   12:30 am after second bag of fluids patient reports feeling better and feels like going home.  Patient tried to stand and walk and states the back of her head is throbbing and she feels to dizzy to walk. Patient now reports that she had a fall 2 days ago with ? LOC and hit the back of her head. The symptoms she is having started after the fall. Patient reports that she is very concerned about the head injury. Patient had not mentioned a fall to anyone before this time.  BP (!) 155/98   Pulse (!) 115   Temp 98.9 F (37.2 C) (Oral)   Resp 20   Ht 5\' 3"  (1.6 m)   Wt 135.2 kg (298 lb)   LMP 12/11/2017 (Exact Date)   SpO2 97%   BMI 52.79 kg/m   Care turned over to [name], [facility] @ 1:14 am. Patient awaiting CT scan.    [name], [place] 12/19/17 3142    Dione Booze, MD 12/19/17 [id number]

## 2017-12-19 NOTE — ED Notes (Addendum)
While trying to discharge pt., pt. Reports a severe headache and pain to the back of the head. Pt. Stated that she fell a couple days ago and may have lost consciousness. Pt. Reports not being able to get up. [name], NP notified.

## 2017-12-19 NOTE — Progress Notes (Signed)
Returned for TPA removal from white lumen. Flushes without difficulty, however it appears that line is positional with blood return. After putting pressure/tension on line, this RN was able to withdraw about 2cc/blood. Notified nurse of positional lumen. Other 2 lumens drawing without difficulty. Tomasita Morrow, RN VAST

## 2017-12-19 NOTE — Progress Notes (Signed)
NEUROHOSPITALISTS STROKE TEAM - DAILY PROGRESS NOTE   ADMISSION HISTORY:  Nicole [name] is an 52 y.o. female who presented to the ED with severe occipital headache, N/V/D. Her PMHx is significant for asthma, obesity, migraines, HTN and HLD. She stated to the ED staff that she has had a migraine headache for the past 2 days. Further interview with neurology reveals that her symptoms began on Wednesday evening while eating dinner; she had abrupt onset of vertigo with nausea and vomiting; concomitantly, she developed a severe occipital headache, worse on the left. Denies neck pain and no preceding trauma or rapid neck extension, flexion or rotation. Since than, she has been unable to control the vomiting and the headache. She normally takes Tylenol for pain but the headache got worse. She denied to the ED staff URI symptoms, chest pain, SOB, cough, abdominal pain or urinary symptoms. She endorsed feeling extremely lightheaded. She stated to the ED staff that her headache felt like a typical migraine. She tried to take Imitrex and Zofran at home without relief.   Headache pain in the ED was rated as 9/10. After a migraine cocktail and IVF, she reported that her headache was improving and asked to go home. However, at time of d/c she stated that she continued to have dizziness and felt like she could not go home yet. A second bag of IV fluids was ordered, following which the patient again reported feeling better and stated that she would like to go home. At that time she tried to stand and walk and stated that the back of her head was with severe throbbing pain and she felt too dizzy to walk. She then reported that she had a fall 2 days ago and hit the back of her head with possible LOC. She also stated at that time that her presenting symptoms began after the fall.   LSN: Wednesday evening tPA Given: No: Out of the time window  SUBJECTIVE (INTERVAL  HISTORY) Her fiancee is at the bedside. Patient is found laying in bed in NAD. Overall she feels her condition is unchanged. Voices no new complaints. No new events reported overnight. Patient continues to complain of H/A and Neck pain. She states all of her symptoms began on Thursday.    OBJECTIVE Lab Results: CBC:  Recent Labs  Lab 12/18/17 1416  WBC 12.7*  HGB 12.4  HCT 39.5  MCV 80.3  PLT 212   BMP: Recent Labs  Lab 12/18/17 1416 12/19/17 0354 12/19/17 1115  NA 131* 135 145  K 3.5  --  3.4*  CL 97*  --  112*  CO2 21*  --  23  GLUCOSE 137*  --  100*  BUN 9  --  11  CREATININE 0.65  --  0.62  CALCIUM 9.2  --  8.1*  MG  --   --  2.1   Liver Function Tests:  Recent Labs  Lab 12/18/17 1416  AST 22  ALT 16  ALKPHOS 105  BILITOT 0.9  PROT 8.6*  ALBUMIN 3.9   Coagulation Studies:  Recent Labs    12/19/17 0447  APTT 27  INR 1.14   Urinalysis:  Recent Labs  Lab 12/18/17 1455  COLORURINE AMBER*  APPEARANCEUR CLOUDY*  LABSPEC 1.028  PHURINE 6.0  GLUCOSEU NEGATIVE  HGBUR SMALL*  BILIRUBINUR NEGATIVE  KETONESUR NEGATIVE  PROTEINUR 100*  NITRITE NEGATIVE  LEUKOCYTESUR NEGATIVE   PHYSICAL EXAM Temp:  [98.1 F (36.7 C)-99.1 F (37.3 C)] 98.7 F (37.1 C) (02/04  1200) Pulse Rate:  [95-119] 109 (02/04 1005) Resp:  [14-25] 17 (02/04 1005) BP: (113-155)/(48-142) 153/97 (02/04 1005) SpO2:  [86 %-100 %] 94 % (02/04 1005) Weight:  [129.9 kg (286 lb 6 oz)-135.2 kg (298 lb)] 129.9 kg (286 lb 6 oz) (02/04 0436) General - Well nourished, well developed middle aged lady, in no apparent distress HEENT-  Normocephalic,  Cardiovascular - Regular rate and rhythm  Respiratory - Lungs clear bilaterally. No wheezing. Abdomen - soft and non-tender, BS normal Extremities- no edema or cyanosis Neurologic Examination:  Mental Status: Awake and alert. Fully oriented. No dysarthria. Speech fluent with intact comprehension and naming. Able to comprehend all questions  accurately. Normal insight, Concentration intact with ability to perform simple addition and accurately recite months of the year in order.  Cranial Nerves: II:  Visual fields intact to left and right. PERRL at 3 mm >> 2 mm.   III,IV, VI: Drift of eyes to right with prominent fast beating horizontal nystagmus to the left;  rotatory component present with upgaze. Able to cross midline to the left. Horzontal nystagmus is present in all directions of gaze, but decreases with downgaze.   V,VII: Smile symmetric, facial temp sensation decreased on the left VIII: hearing intact to voice IX,X: Unable to visualize palate due to severe nausea XI: Symmetric XII: Unable to assess tongue protrusion due to severe nausea Motor: Equal strength in all 4 limbs.  Maximum strength elicited in upper extremities is 4/5 proximally and distally in the context of nausea, vertigo and severe occipital headache. No asymmetry.  Knee extension and ADF/APF 4+/5 bilateral lower extremities without asymmetry.  Sensory: Temp decreased to LUE. Temp sensation normal to RUE and bilateral lower extremities. FT intact x 4 without extinction.   Deep Tendon Reflexes:   Bilaterally symmetrical and easily elicitable Toes downgoing bilaterally Cerebellar: Dysmetria with slow FNF bilaterally. Heel-shin normal Gait: Not tested  IMAGING: I have personally reviewed the radiological images below and agree with the radiology interpretations. Ct Angio Head W Or Wo Contrast  Result Date: 12/19/2017 CLINICAL DATA:  Dizziness, fall 2 days ago with possible loss of consciousness. EXAM: CT ANGIOGRAPHY HEAD TECHNIQUE: Multidetector CT imaging of the head was performed using the standard protocol during bolus administration of intravenous contrast. Multiplanar CT image reconstructions and MIPs were obtained to evaluate the vascular anatomy. CONTRAST:  2mL ISOVUE-370 IOPAMIDOL (ISOVUE-370) INJECTION 76% COMPARISON:  None. FINDINGS: CT HEAD BRAIN:  Large LEFT inferior cerebellar acute to subacute infarct. Additional small bilateral cerebellar infarcts. Cytotoxic edema resulting and inferior cerebellar tonsil herniation, effaced cerebral spinal fluid at the foramen magnum. Very mild prominence of the supratentorial ventricles. No intraparenchymal hemorrhage. No abnormal extra-axial fluid collections. VASCULAR: Unremarkable. SKULL/SOFT TISSUES: No skull fracture. No significant soft tissue swelling. ORBITS/SINUSES: The included ocular globes and orbital contents are normal.The mastoid aircells and included paranasal sinuses are well-aerated. OTHER: None. CTA HEAD-noisy image quality. ANTERIOR CIRCULATION: Patent cervical internal carotid arteries, petrous, cavernous and supra clinoid internal carotid arteries. Patent anterior communicating artery with 4 mm origin aneurysm versus infundibulum. Patent anterior and middle cerebral arteries. 5 mm bilobed LEFT MCA bifurcation. No large vessel occlusion, significant stenosis, contrast extravasation. POSTERIOR CIRCULATION: Tandem severe stenosis RIGHT posterior-inferior cerebellar artery, non demonstrable LEFT posterior-inferior cerebellar artery. RIGHT distal V4 segment occluded. Central filling defect LEFT mid V4 segment with moderate stenosis. Patent basilar artery. Robust bilateral posterior communicating arteries, patent posterior cerebral arteries. No contrast extravasation or aneurysm. VENOUS SINUSES: Major dural venous sinuses are patent though not  tailored for evaluation on this angiographic examination. ANATOMIC VARIANTS: None. DELAYED PHASE: Not performed. MIP images reviewed. IMPRESSION: CT HEAD: 1. 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 Additional acute small bilateral cerebellar infarcts. 2. Cerebellar edema resulting in inferior tonsillar herniation and early obstructive hydrocephalus. CTA HEAD: 1. Occluded LEFT PICA. Severe stenosis RIGHT PICA, secondary  to dissection, less likely vaso spasm or atherosclerosis. 2. LEFT V4 thromboembolism resulting in moderate stenosis, dissection not excluded. 3. 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 4. Recommend CTA NECK. Critical Value/emergent results were called by telephone at the time of interpretation on 12/19/2017 at 2:10 am to PA. [name] , who verbally acknowledged these results. Electronically Signed   By: Awilda Metro M.D.   On: 12/19/2017 02:18   Dg Chest 2 View  Result Date: 12/18/2017 CLINICAL DATA:  Pt to ED via [facility] with flu like symptoms--started Wednesday-- saw PCP -- given meds for migraine, felt better Wednesday night, worse Thursday-- EXAM: CHEST  2 VIEW COMPARISON:  None. FINDINGS: The heart size and mediastinal contours are within normal limits. Both lungs are clear. The visualized skeletal structures are unremarkable. IMPRESSION: No active cardiopulmonary disease. Electronically Signed   By: Norva Pavlov M.D.   On: 12/18/2017 17:16   Ct Head Wo Contrast  Result Date: 12/19/2017 CLINICAL DATA:  Fatigue and malaise. EXAM: CT HEAD WITHOUT CONTRAST TECHNIQUE: Contiguous axial images were obtained from the base of the skull through the vertex without intravenous contrast. COMPARISON:  CTA earlier today FINDINGS: Brain: Acute infarcts involving the left more than right cerebellum with cytotoxic edema causing posterior fossa mass effect. There is cerebellar tonsillar descent by 8-9 mm and fourth ventricular narrowing/effacement. Mild lateral ventriculomegaly, most convincing at the temporal horns. Negative for hemorrhage or visible interval infarct. Vascular: Recent CTA. No hyperdense vessel. There is atherosclerotic calcification of the left V4 segment. Skull: Negative Sinuses/Orbits: Large retention cyst in the left maxillary sinus. Mucosal thickening elsewhere. IMPRESSION: Acute infarcts in the left more than right cerebellum with posterior fossa mass effect and  foramen magnum stenosis. Fourth ventricular narrowing with mild lateral ventriculomegaly. No progression from study earlier today. Electronically Signed   By: Marnee Spring M.D.   On: 12/19/2017 07:34   Dg Chest Port 1 View  Result Date: 12/19/2017 CLINICAL DATA:  Femoral line placement. EXAM: PORTABLE CHEST 1 VIEW COMPARISON:  Chest radiograph December 18, 2017 FINDINGS: The cardiac silhouette is upper limits of normal in size, mediastinal silhouette is not suspicious. No pleural effusion or focal consolidation. No pneumothorax. Soft tissue planes and included osseous structures are non suspicious. IMPRESSION: Borderline cardiomegaly.  No acute pulmonary process. Electronically Signed   By: Awilda Metro M.D.   On: 12/19/2017 06:11    Echocardiogram:                                              PENDING MRI/MRA Head/Neck                                        PENDING     IMPRESSION: Nicole [name] is a 52 y.o. female with PMH of HTN and HLD presenting with large left cerebellar and patchy right cerebellar subacute ischemic infarctions with mass effect upon the  4th ventricle and medulla. 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 CTA head shows occluded LEFT PICA, LEFT V4 thromboembolism resulting in moderate stenosis with dissection not excluded, and severe stenosis of RIGHT PICA, secondary to dissection versus vasospasm or atherosclerosis.  Acute infarcts involving the left more than right cerebellum with cytotoxic edema causing posterior fossa mass effect  Suspected Etiology: unknown at this time Resultant Symptoms: H/A, N/V Stroke Risk Factors: hyperlipidemia and hypertension Other Stroke Risk Factors: Morbid Obesity, Body mass index is 50.73 kg/m.  Migraines, OSA/CPAP  Outstanding Stroke Work-up Studies:     Echocardiogram:                                                    PENDING MRI/MRA Head/Neck                                               PENDING  12/19/2017  ASSESSMENT:   Neuro exam remains stable. Central line placed overnight. 23.4% saline to be started this AM.  PLAN  12/19/2017: Continue Statin Continue ASA 81 mg, Nurse to verify allergy/intolerance is just nausea Head of bed 30-45 degrees. SBP Goal less than 180 Medicate for H/A pain, without over sedating Frequent neuro checks Telemetry monitoring PT/OT/SLP Consult PM & Rehab Consult Case Management /MSW Ongoing aggressive stroke risk factor management Patient counseled to be compliant with her medications Patient counseled on Lifestyle modifications including, Diet, Exercise, and Stress Follow up with [facility] in 6 weeks  DYSPHAGIA: Passes SLP swallow evaluation Aspiration Precautions in progress  CEREBRAL EDEMA: 23.4% NS Infusion per order set. Sodium goal 150-155. Na Levels every 6 hrs Neurosurgery consulted, Dr Yetta Barre - no need for emergent intervention at this time High risk for respiratory compromise & progression of cerebral edema next 2-3 days. Peak swelling from strokes is usually 3-5 days. At risk for sudden decompensation and death from acute noncommunicating hydrocephalus and/or brainstem compression/herniation Considerstatneurosurgical consultation for hemicraniectomy for mentation worsens or if there is any imaging evidence of herniation  HYPERTENSION: Stable SBP goal of < 180. DBP goal of < 105.  PRN Labetalol, Cleviprex infusion discontinued Long term BP goal normotensive. Restart home B/P medications - 12/19/2017 Home Meds: Metoprolol, HCTZ, Cardizem   HX of OSA/CPAP RT consulted  HYPERLIPIDEMIA:    Component Value Date/Time   CHOL 140 12/19/2017 0930   TRIG 107 12/19/2017 0930   HDL 34 (L) 12/19/2017 0930   CHOLHDL 4.1 12/19/2017 0930   VLDL 21 12/19/2017 0930   LDLCALC 85 12/19/2017 0930  Home Meds:  Lipitor 40 mg LDL  goal < 70 Increased to Lipitor to 80 mg daily Continue statin at discharge  R/O DIABETES: No results  found for: HGBA1C  PENDING No results for input(s): GLUCAP in the last 168 hours. HgbA1c goal < 7.0 Continue CBG monitoring and SSI to maintain glucose 140-180 mg/dl DM education, if necessary   OBESITY Morbid Obesity, Body mass index is 50.73 kg/m. Greater than/equal to 30  Other Active Problems: Active Problems:   New cerebellar infarct (HCC)   Encounter for central line placement   Nausea vomiting and diarrhea    Hospital day # 0 VTE prophylaxis: SCD's  Diet : Diet NPO time  specified Aspiration precautions Seizure precautions   FAMILY UPDATES: family at bedside  TEAM UPDATES: Scatliffe, Gypsy Balsam, MD   Prior Home Stroke Medications:  ASA 81 mg, not taking daily Discharge Stroke Meds:  Please discharge patient on TBD, will need to verify if patient has a true allery to ASA vs an intolerance    Disposition: 07-Left Against Medical Advice/Left Without Being Seen/Elopement Therapy Recs:               PENDING Follow Up:  Follow-up Information    Micki Riley, MD. Schedule an appointment as soon as possible for a visit in 6 week(s).   Specialties:  Neurology, Radiology Contact information: [address] [place] [address] [phone]          System, Provider Not In -PCP Follow up in 1-2 weeks   Case Management aware of need   Assessment & plan discussed with with attending physician and they are in agreement.    Beryl Meager, ANP-C Stroke Neurology Team 12/19/2017 12:48 PM I have personally examined this patient, reviewed notes, independently viewed imaging studies, participated in medical decision making and plan of care.ROS completed by me personally and pertinent positives fully documented  I have made any additions or clarifications directly to the above note. Agree with note above.  She presented with large left cerebellar infarct with cytotoxic edema and mass effect on the fourth ventricle and mild hydrocephalus. She remains at risk for  neurological worsening. Continue hypertonic saline with sodium goal of 150-155. Close neurological monitoring and neurosurgical follow-up. Long discussion with the patient and answered questions. Check MRI scan with MRA of the neck This patient is critically ill and at significant risk of neurological worsening, death and care requires constant monitoring of vital signs, hemodynamics,respiratory and cardiac monitoring, extensive review of multiple databases, frequent neurological assessment, discussion with family, other specialists and medical decision making of high complexity.I have made any additions or clarifications directly to the above note.This critical care time does not reflect procedure time, or teaching time or supervisory time of PA/NP/Med Resident etc but could involve care discussion time.  I spent 40 minutes of neurocritical care time  in the care of  this patient.      Delia Heady, MD Medical Director [facility] St[facility] Pager: [phone] 12/19/2017 2:17 PM  To contact Stroke Continuity provider, please refer to [email or web address]. After hours, contact General Neurology

## 2017-12-19 NOTE — Progress Notes (Signed)
Patient admitted from the ER. Patient alert and oriented x 4. Patient oriented to room, medicated for headache and was made comfortable.

## 2017-12-19 NOTE — ED Provider Notes (Signed)
Patient taken in sign out at shift change from [place].    Damian Leavell concerned about persistent dizziness.  Reports additional hx that patient had a fall 2 days ago and hit head.  CT pending.  Will change to CT angio head w/wo due to symptoms going on longer than 6 hours.  Received phone call from radiology informing me of results.  Will consult neurology and neurosurgery.  Spoke with Dr. Otelia Limes, who will consult and agrees with neurosurgery consultation and admission as patient will likely need to go to the ICU.  0305 Discussed with neurosurgery NP, who is reviewing images and will call back.  3:40 AM Dr. Otelia Limes discussed the case directly with Dr. Yetta Barre from neurosurgery, who does not feel that emergent procedure indicated due to patient being A&Ox4 and moving all extremities, but will consult in AM.  Recommends that we call back for neurovascular changes.  Discussed with Dr. Arsenio Loader, who will have the intensivists see and admit the patient.  3:51 AM Discussed the case with Dr. Rhunette Croft, who is aware of the situation.      CRITICAL CARE Performed by: [name]   Total critical care time: 50 minutes  Critical care time was exclusive of separately billable procedures and treating other patients.  Critical care was necessary to treat or prevent imminent or life-threatening deterioration.  Critical care was time spent personally by me on the following activities: development of treatment plan with patient and/or surrogate as well as nursing, discussions with consultants, evaluation of patient's response to treatment, examination of patient, obtaining history from patient or surrogate, ordering and performing treatments and interventions, ordering and review of laboratory studies, ordering and review of radiographic studies, pulse oximetry and re-evaluation of patient's condition.    [name], PA-C 12/19/17 9798    Shaune Pollack, MD 12/19/17 1009

## 2017-12-19 NOTE — H&P (Signed)
.. ..  Name: Nicole [name] MRN: [id number] DOB: 11-04-1966    ADMISSION DATE:  12/18/2017 CONSULTATION DATE:  12/19/17  REFERRING MD :  Otelia Limes MD NEUROLOGY  CHIEF COMPLAINT:  PERSISTENT DIZZINESS, FALL, NAUSEA  BRIEF PATIENT DESCRIPTION: 52 yr old female with hx of Migraines presents with N/V/D and dizziness initially symptoms improved post fluids and migraine cocktail but pt began to have worsening dizziness and headaches. 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 CTA confirms occlusion of left PICA and severe stenosis of Right. Neurology and Neurosurgery ware and managing patient. PCCM consulted for hyperosmolar therapy and frequent Neurochecks due to high risk of decompensation  SIGNIFICANT EVENTS  -Dizziness despite migraine meds -CTH showing acute cerebellar infarct with mass effect and initial herniation  STUDIES:  CTH  1. 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 Additional acute small bilateral cerebellar infarcts. 2. Cerebellar edema resulting in inferior tonsillar herniation and early obstructive hydrocephalus.  CTA HEAD: 1. Occluded LEFT PICA. Severe stenosis RIGHT PICA, secondary to dissection, less likely vaso spasm or atherosclerosis. 2. LEFT V4 thromboembolism resulting in moderate stenosis, dissection not excluded. 3. 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 4. Recommend CTA NECK.   HISTORY OF PRESENT ILLNESS: 52 yr old female with PMHx significant for Asthma, Depression, HLD, HTN, Migraines presented with complaint of dizziness, s/p fall, nausea, vomiting and diarrhea with a headache that feels like typical migraine. She initially felt better after migraine cocktail but then became dizzy again. 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 Neurology and Neurosurgery aware of patient. Starting on 3% saline with  frequent neurochecks. PCCM consulted for admission  PAST MEDICAL HISTORY :   has a past medical history of Asthma, Depression, Hyperlipemia, Hypertension, Migraines, Obesity, and Sciatica.  has no past surgical history on file. Prior to Admission medications   Medication Sig Start Date End Date Taking? Authorizing Provider  aspirin EC 81 MG tablet Take 81 mg by mouth daily.    [provider]  diclofenac sodium (VOLTAREN) 1 % GEL Apply 4 g topically 4 (four) times daily. 10/25/17   Joy, Shawn C, PA-C  diltiazem (CARDIZEM CD) 240 MG 24 hr capsule Take 240 mg by mouth daily.    [provider]  FLUoxetine (PROZAC) 20 MG tablet Take 20 mg by mouth daily.    [provider]  hydrochlorothiazide (HYDRODIURIL) 25 MG tablet Take 25 mg by mouth daily.    [provider]  HYDROcodone-acetaminophen (NORCO/VICODIN) 5-325 MG tablet Take 1 tablet by mouth every 6 (six) hours as needed for severe pain. Patient not taking: Reported on 11/12/2016 04/24/16   Gwyneth Sprout, MD  HYDROcodone-acetaminophen (NORCO/VICODIN) 5-325 MG tablet Take 1 tablet by mouth every 4 (four) hours as needed. 11/12/16   Neva Seat, Tiffany, PA-C  Ibuprofen-Famotidine (DUEXIS) 800-26.6 MG TABS Take 1 tablet by mouth 3 (three) times daily as needed (for pain).     [provider]  lidocaine (LIDODERM) 5 % Place 1 patch onto the skin daily. Remove & Discard patch within 12 hours or as directed by MD 10/25/17   Joy, Shawn C, PA-C  methocarbamol (ROBAXIN) 500 MG tablet Take 1 tablet (500 mg total) by mouth 2 (two) times daily. 10/25/17   Joy, Shawn C, PA-C  predniSONE (STERAPRED UNI-PAK 21 TAB) 10 MG (21) TBPK tablet Take 6 tabs (60mg ) on day 1, 5 tabs (50mg ) on day 2, 4 tabs (  40mg ) on day 3, 3 tabs (30mg ) on day 4, 2 tabs (20mg ) on day 5, and 1 tab (10mg ) on day 6. 10/25/17   Joy, Shawn C, PA-C  simvastatin (ZOCOR) 20 MG tablet Take 20 mg by mouth daily.    [provider]  zolpidem  (AMBIEN) 5 MG tablet Take 5 mg by mouth at bedtime as needed for sleep.    [provider]   Allergies  Allergen Reactions  . Asa [Aspirin] Nausea Only  . Ibuprofen Nausea Only  . Morphine And Related Palpitations    FAMILY HISTORY:  family history is not on file. SOCIAL HISTORY:  reports that  has never smoked. she has never used smokeless tobacco. She reports that she does not drink alcohol or use drugs.  REVIEW OF SYSTEMS:  ( bolded items are pertinent positives) Constitutional: Negative for fever, chills, weight loss, malaise/fatigue and diaphoresis.  HENT: Negative for hearing loss, ear pain, nosebleeds, congestion, sore throat, neck pain, tinnitus and ear discharge. dizziness Eyes: Negative for blurred vision, double vision, photophobia, pain, discharge and redness.  Respiratory: Negative for cough, hemoptysis, sputum production, shortness of breath, wheezing and stridor.   Cardiovascular: Negative for chest pain, palpitations, orthopnea, claudication, leg swelling and PND.  Gastrointestinal: Negative for heartburn, nausea, vomiting, abdominal pain, diarrhea, constipation, blood in stool and melena.  Genitourinary: Negative for dysuria, urgency, frequency, hematuria and flank pain.  Musculoskeletal: Negative for myalgias, back pain, joint pain and falls.  Skin: Negative for itching and rash.  Neurological: Negative for dizziness, tingling, tremors, sensory change, speech change, focal weakness, seizures, loss of consciousness, weakness and headaches( similar to when she gets her migraines), photophobia.  Endo/Heme/Allergies: Negative for environmental allergies and polydipsia. Does not bruise/bleed easily.  SUBJECTIVE:   VITAL SIGNS: Temp:  [98.1 F (36.7 C)-98.9 F (37.2 C)] 98.9 F (37.2 C) (02/03 2154) Pulse Rate:  [101-115] 101 (02/04 0346) Resp:  [14-20] 20 (02/04 0346) BP: (133-155)/(48-98) 133/48 (02/04 0346) SpO2:  [95 %-100 %] 95 % (02/04 0346) Weight:   [135.2 kg (298 lb)] 135.2 kg (298 lb) (02/03 1406)  PHYSICAL EXAMINATION: General:  Obese female laying in bed covering her eyes Neuro:+dysmetria +nausea ( positional) unable to assess for ataxia pt in bed, difficult to assess CN exam most maneuvers make pt feel sick.  HEENT:  NCAT, nasal cannula. No NGT. Thick neck. PERRLA. EOM intact ( exam makes her dizzy and nauseous) Cardiovascular:  S1 and S2 appreciated no M/R/G Lungs:  CTAB no wheezing and no crackles Abdomen:  Obese, soft, active BS, non tender.  Musculoskeletal:  +1 edema in b/l lower ext  Skin:  Grossly intact, no rash  Recent Labs  Lab 12/18/17 1416  NA 131*  K 3.5  CL 97*  CO2 21*  BUN 9  CREATININE 0.65  GLUCOSE 137*   Recent Labs  Lab 12/18/17 1416  HGB 12.4  HCT 39.5  WBC 12.7*  PLT 212   Ct Angio Head W Or Wo Contrast  Result Date: 12/19/2017 CLINICAL DATA:  Dizziness, fall 2 days ago with possible loss of consciousness. EXAM: CT ANGIOGRAPHY HEAD TECHNIQUE: Multidetector CT imaging of the head was performed using the standard protocol during bolus administration of intravenous contrast. Multiplanar CT image reconstructions and MIPs were obtained to evaluate the vascular anatomy. CONTRAST:  50mL ISOVUE-370 IOPAMIDOL (ISOVUE-370) INJECTION 76% COMPARISON:  None. FINDINGS: CT HEAD BRAIN: Large LEFT inferior cerebellar acute to subacute infarct. Additional small bilateral cerebellar infarcts. Cytotoxic edema resulting and inferior cerebellar tonsil  herniation, effaced cerebral spinal fluid at the foramen magnum. Very mild prominence of the supratentorial ventricles. No intraparenchymal hemorrhage. No abnormal extra-axial fluid collections. VASCULAR: Unremarkable. SKULL/SOFT TISSUES: No skull fracture. No significant soft tissue swelling. ORBITS/SINUSES: The included ocular globes and orbital contents are normal.The mastoid aircells and included paranasal sinuses are well-aerated. OTHER: None. CTA HEAD-noisy image quality.  ANTERIOR CIRCULATION: Patent cervical internal carotid arteries, petrous, cavernous and supra clinoid internal carotid arteries. Patent anterior communicating artery with 4 mm origin aneurysm versus infundibulum. Patent anterior and middle cerebral arteries. 5 mm bilobed LEFT MCA bifurcation. No large vessel occlusion, significant stenosis, contrast extravasation. POSTERIOR CIRCULATION: Tandem severe stenosis RIGHT posterior-inferior cerebellar artery, non demonstrable LEFT posterior-inferior cerebellar artery. RIGHT distal V4 segment occluded. Central filling defect LEFT mid V4 segment with moderate stenosis. Patent basilar artery. Robust bilateral posterior communicating arteries, patent posterior cerebral arteries. No contrast extravasation or aneurysm. VENOUS SINUSES: Major dural venous sinuses are patent though not tailored for evaluation on this angiographic examination. ANATOMIC VARIANTS: None. DELAYED PHASE: Not performed. MIP images reviewed. IMPRESSION: CT HEAD: 1. 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 Additional acute small bilateral cerebellar infarcts. 2. Cerebellar edema resulting in inferior tonsillar herniation and early obstructive hydrocephalus. CTA HEAD: 1. Occluded LEFT PICA. Severe stenosis RIGHT PICA, secondary to dissection, less likely vaso spasm or atherosclerosis. 2. LEFT V4 thromboembolism resulting in moderate stenosis, dissection not excluded. 3. 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 4. Recommend CTA NECK. Critical Value/emergent results were called by telephone at the time of interpretation on 12/19/2017 at 2:10 am to PA. [name] , who verbally acknowledged these results. Electronically Signed   By: Awilda Metro M.D.   On: 12/19/2017 02:18   Dg Chest 2 View  Result Date: 12/18/2017 CLINICAL DATA:  Pt to ED via [facility] with flu like symptoms--started Wednesday-- saw PCP -- given meds for migraine, felt  better Wednesday night, worse Thursday-- EXAM: CHEST  2 VIEW COMPARISON:  None. FINDINGS: The heart size and mediastinal contours are within normal limits. Both lungs are clear. The visualized skeletal structures are unremarkable. IMPRESSION: No active cardiopulmonary disease. Electronically Signed   By: Norva Pavlov M.D.   On: 12/18/2017 17:16    ASSESSMENT / PLAN: NEURO: Acute left PICA occlusion (Left V4 thromboembolism-> stenosis and dissection)with severe Right PICA stenosis (secondary to dissection vs vasospasm or atherosclerosis) with mass effect on 4th ventricle and medulla with early signs of obstructive hydrocephalus inferior tonsillar herniation High risk for decompensation from acute non communicating hydrocephalus vs compression/herniation - high chance of fatality Neuorchecks Q1 No anticoagulation due to high risk fo hemorrhagic conversion.  Neuro ok with ASA 300mg  supp Hold Plavix- pt can't tolerate PO MRI was initialy planned but do to severe nausea and vertoigo pt can't lay flat without increased nausea and possible emesis High Aspiration risk HOB 30-45 deg CVC being placed starting on 3% saline at 75cc/hr  If exam worsens or clinical status changes notify PCCM, and Neuro immediately- will need to switch to 23% saline and pt may need emergent craniectomy or ventriculostomy (ultimately to be determined by Neurosurgery).  CARDIAC: H/o HTN- not a candidate for permissive HTN If systolic BP exceeds 140 mmHg will start on clevidipine ggt Currently hemodynamically stable Will need CVC catheter for hypertonic/hyperosm therapy Telemetry monitoring  PULMONARY: She is not intubated Discussed in detail with Neurology Aware that due to severe vertigo when she lays flat nausea intensifies and high aspiration risk However  no intubation at this time (intubation may increase ICP and worsen pt) Supplemental O2 as needed to keep Sat >92% HOB elevated at 30 -45 deg  ID: Low  suspicion for active infection UA negative Blood cx x 2- send RVP send PCT sent  No empiric antibiotics at this time   Endocrine: No h/o insulin resistance or deficiency Monitor blood glucose with BMET If glucose>200mg /dl at any point start SSI Goal 140-180mg /dl  GI: Severe nausea- vertigo secondary to increased ICP/ Tonsilar herniation/cerebellar infarct Insert NGT NPO HOB >30 deg Zofran Q6 prn  High Aspiration risk   Heme: No anticoagulation therapy due to high risk of hemorrhagic conversion Held Heparin [place] at this time only SCDs If Hgb<7 transfuse PRBCs Type and screen Neuro ok with ASA 300mg  supp Pt has no h/o coagulopathy   RENAL GFR >60 no evidence of acute injury Lab Results  Component Value Date   CREATININE 0.65 12/18/2017   CREATININE 0.70 04/24/2016  Non Anion Gap Metabolic Acidosis Electrolytes replace as needed for goal K= 4, Mg+ = 2, Phos = 2 LA 1.06     I, Dr Newell Coral have personally reviewed patient's available data, including medical history, events of note, physical examination and test results as part of my evaluation. I have discussed with NP and other care providers such as neurology, pharmacist, RN and Pola Corn. The patient is critically ill with multiple organ systems failure and requires high complexity decision making for assessment and support, frequent evaluation and titration of therapies, application of advanced monitoring technologies and extensive interpretation of multiple databases. Critical Care Time devoted to patient care services described in this note is 55 Minutes. This time reflects time of care of this signee Dr Newell Coral. This critical care time does not reflect procedure time, or teaching time or supervisory time of NP but could involve care discussion time    DISPOSITION:ICU CC TIME:55 mins PROGNOSIS: Guarded- extremely high risk of decompensation CODE STATUS: Full FAMILY:   Signed Dr Newell Coral Pulmonary Critical Care Locums Pulmonary and Critical Care Medicine [facility] Pager: [phone]  12/19/2017, 4:07 AM

## 2017-12-19 NOTE — Progress Notes (Signed)
eLink Physician-Brief Progress Note Patient Name: Nicole [name] DOB: 11/03/1966 MRN: [id number]   Date of Service  12/19/2017  HPI/Events of Note  Patient c/o headache - Creatinine = 0.65. Allergy to Ibuprofen - Nausea only. Has had Toradol X 2 earlier this admission.   eICU Interventions  Will order Toradol 15 mg IV X 1 now.      Intervention Category Intermediate Interventions: Pain - evaluation and management  Sommer,Steven Eugene 12/19/2017, 4:47 AM

## 2017-12-19 NOTE — Procedures (Signed)
Central Venous Catheter Insertion Procedure Note Kynslie Calitri [id number] 1966/11/15  Procedure: Insertion of Central Venous Catheter Indications: delivery of hyperosmolar therapy  Procedure Details Consent: Risks of procedure as well as the alternatives and risks of each were explained to the (patient/caregiver).  Consent for procedure obtained. Time Out: Verified patient identification, verified procedure, site/side was marked, verified correct patient position, special equipment/implants available, medications/allergies/relevent history reviewed, required imaging and test results available.  Performed  Maximum sterile technique was used including antiseptics, cap, gloves, gown, hand hygiene, mask and sheet. Skin prep: Chlorhexidine; local anesthetic administered A antimicrobial bonded/coated triple lumen catheter was placed in the right femoral vein due to multiple attempts, no other available access using the Seldinger technique. Attempted in the Right IJ and pt has short neck, very uncomfortable, also IJ collapsing with respirations high risk positioning, attempted x 3. Switched to femoral due to emergent nature of therapy.  Evaluation Blood flow good- Able to see wire in femoral vein on [place] and follow course Complications: No apparent complications Patient did tolerate procedure well. Chest X-ray ordered to verify placement.  CXR: pending- obtained because of attempts in IJ.   Kristen D Scatliffe 12/19/2017, 7:14 AM

## 2017-12-19 NOTE — ED Notes (Signed)
Neuro surgery at bedside.

## 2017-12-19 NOTE — Consult Note (Signed)
Reason for Consult: cerebral infarct with early hydrocephalus   Referring Physician: EDP  Nicole [name] is an 52 y.o. female.   HPI:  52 year old patient presents yesterday afternoon with headache, dizziness for 2 days and fall. Does report some N and V that has been uncontrollable. PMH listed below. She is dizzy when she stands and feels unstable.   Past Medical History:  Diagnosis Date  . Asthma   . Depression   . Hyperlipemia   . Hypertension   . Migraines   . Obesity   . Sciatica     History reviewed. No pertinent surgical history.  Allergies  Allergen Reactions  . Asa [Aspirin] Nausea Only  . Ibuprofen Nausea Only  . Morphine And Related Palpitations    Social History   Tobacco Use  . Smoking status: Never Smoker  . Smokeless tobacco: Never Used  Substance Use Topics  . Alcohol use: No    No family history on file.   Review of Systems  Positive ROS: headache and dizziness  All other systems have been reviewed and were otherwise negative with the exception of those mentioned in the HPI and as above.  Objective: Vital signs in last 24 hours: Temp:  [98.1 F (36.7 C)-98.9 F (37.2 C)] 98.9 F (37.2 C) (02/03 2154) Pulse Rate:  [101-115] 101 (02/04 0346) Resp:  [14-20] 20 (02/04 0346) BP: (133-155)/(48-98) 133/48 (02/04 0346) SpO2:  [95 %-100 %] 95 % (02/04 0346) Weight:  [135.2 kg (298 lb)] 135.2 kg (298 lb) (02/03 1406)  General Appearance: Alert, cooperative, no distress, appears stated age Head: Normocephalic, without obvious abnormality, atraumatic Eyes: PERRL, conjunctiva/corneas clear, EOM's intact, fundi benign, both eyes      Back: not tested Lungs:  respirations unlabored Heart: Regular rate and rhythm Extremities: Extremities normal, atraumatic, no cyanosis or edema Pulses: 2+ and symmetric all extremities Skin: Skin color, texture, turgor normal, no rashes or lesions  NEUROLOGIC:   Mental status: A&O x4, no aphasia, good attention  span, Memory and fund of knowledge Motor Exam - grossly normal, normal tone and bulk Sensory Exam - grossly normal Reflexes: symmetric, no pathologic reflexes, No Hoffman's, No clonus Coordination - grossly normal Gait - not tested Balance - not tesed Cranial Nerves: I: smell Not tested  II: visual acuity  OS: na    OD: na  II: visual fields Full to confrontation  II: pupils Equal, round, reactive to light  III,VII: ptosis None  III,IV,VI: extraocular muscles  Full ROM  V: mastication Normal  V: facial light touch sensation  Normal  V,VII: corneal reflex  Present  VII: facial muscle function - upper  Normal  VII: facial muscle function - lower Normal  VIII: hearing Not tested  IX: soft palate elevation  Normal  IX,X: gag reflex Present  XI: trapezius strength  5/5  XI: sternocleidomastoid strength 5/5  XI: neck flexion strength  5/5  XII: tongue strength  Normal    Data Review Lab Results  Component Value Date   WBC 12.7 (H) 12/18/2017   HGB 12.4 12/18/2017   HCT 39.5 12/18/2017   MCV 80.3 12/18/2017   PLT 212 12/18/2017   Lab Results  Component Value Date   NA 131 (L) 12/18/2017   K 3.5 12/18/2017   CL 97 (L) 12/18/2017   CO2 21 (L) 12/18/2017   BUN 9 12/18/2017   CREATININE 0.65 12/18/2017   GLUCOSE 137 (H) 12/18/2017   No results found for: INR, PROTIME  Radiology: Ct Angio  Head W Or Wo Contrast  Result Date: 12/19/2017 CLINICAL DATA:  Dizziness, fall 2 days ago with possible loss of consciousness. EXAM: CT ANGIOGRAPHY HEAD TECHNIQUE: Multidetector CT imaging of the head was performed using the standard protocol during bolus administration of intravenous contrast. Multiplanar CT image reconstructions and MIPs were obtained to evaluate the vascular anatomy. CONTRAST:  64mL ISOVUE-370 IOPAMIDOL (ISOVUE-370) INJECTION 76% COMPARISON:  None. FINDINGS: CT HEAD BRAIN: Large LEFT inferior cerebellar acute to subacute infarct. Additional small bilateral cerebellar  infarcts. Cytotoxic edema resulting and inferior cerebellar tonsil herniation, effaced cerebral spinal fluid at the foramen magnum. Very mild prominence of the supratentorial ventricles. No intraparenchymal hemorrhage. No abnormal extra-axial fluid collections. VASCULAR: Unremarkable. SKULL/SOFT TISSUES: No skull fracture. No significant soft tissue swelling. ORBITS/SINUSES: The included ocular globes and orbital contents are normal.The mastoid aircells and included paranasal sinuses are well-aerated. OTHER: None. CTA HEAD-noisy image quality. ANTERIOR CIRCULATION: Patent cervical internal carotid arteries, petrous, cavernous and supra clinoid internal carotid arteries. Patent anterior communicating artery with 4 mm origin aneurysm versus infundibulum. Patent anterior and middle cerebral arteries. 5 mm bilobed LEFT MCA bifurcation. No large vessel occlusion, significant stenosis, contrast extravasation. POSTERIOR CIRCULATION: Tandem severe stenosis RIGHT posterior-inferior cerebellar artery, non demonstrable LEFT posterior-inferior cerebellar artery. RIGHT distal V4 segment occluded. Central filling defect LEFT mid V4 segment with moderate stenosis. Patent basilar artery. Robust bilateral posterior communicating arteries, patent posterior cerebral arteries. No contrast extravasation or aneurysm. VENOUS SINUSES: Major dural venous sinuses are patent though not tailored for evaluation on this angiographic examination. ANATOMIC VARIANTS: None. DELAYED PHASE: Not performed. MIP images reviewed. IMPRESSION: CT HEAD: 1. 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 Additional acute small bilateral cerebellar infarcts. 2. Cerebellar edema resulting in inferior tonsillar herniation and early obstructive hydrocephalus. CTA HEAD: 1. Occluded LEFT PICA. Severe stenosis RIGHT PICA, secondary to dissection, less likely vaso spasm or atherosclerosis. 2. LEFT V4 thromboembolism resulting  in moderate stenosis, dissection not excluded. 3. 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 4. Recommend CTA NECK. Critical Value/emergent results were called by telephone at the time of interpretation on 12/19/2017 at 2:10 am to PA. [name] , who verbally acknowledged these results. Electronically Signed   By: Awilda Metro M.D.   On: 12/19/2017 02:18   Dg Chest 2 View  Result Date: 12/18/2017 CLINICAL DATA:  Pt to ED via [facility] with flu like symptoms--started Wednesday-- saw PCP -- given meds for migraine, felt better Wednesday night, worse Thursday-- EXAM: CHEST  2 VIEW COMPARISON:  None. FINDINGS: The heart size and mediastinal contours are within normal limits. Both lungs are clear. The visualized skeletal structures are unremarkable. IMPRESSION: No active cardiopulmonary disease. Electronically Signed   By: Norva Pavlov M.D.   On: 12/18/2017 17:16     Assessment/Plan: 52 year old presents to the ED with dizziness, headaches N and V. CTA revealed large left sided cerebellar infarct with minimal ventricular enlargement. No neurosurgical interventions needed at this time. Exam is intact and awake at bedside. Neurology will admit to 4N. Started on 3% saline.   Tiana Loft Akeema Broder 12/19/2017 4:15 AM

## 2017-12-19 NOTE — Progress Notes (Signed)
Patient ID: Nicole [name], female   DOB: 1966-03-25, 52 y.o.   MRN: [id number] CT head reviewed - L cerebellar infarct unchanged, no ventriculomegaly

## 2017-12-20 ENCOUNTER — Inpatient Hospital Stay (HOSPITAL_COMMUNITY)

## 2017-12-20 ENCOUNTER — Encounter (HOSPITAL_COMMUNITY): Payer: Self-pay

## 2017-12-20 DIAGNOSIS — I639 Cerebral infarction, unspecified: Secondary | ICD-10-CM

## 2017-12-20 DIAGNOSIS — G936 Cerebral edema: Secondary | ICD-10-CM

## 2017-12-20 DIAGNOSIS — I5030 Unspecified diastolic (congestive) heart failure: Secondary | ICD-10-CM

## 2017-12-20 DIAGNOSIS — G919 Hydrocephalus, unspecified: Secondary | ICD-10-CM

## 2017-12-20 DIAGNOSIS — R112 Nausea with vomiting, unspecified: Secondary | ICD-10-CM

## 2017-12-20 DIAGNOSIS — Z452 Encounter for adjustment and management of vascular access device: Secondary | ICD-10-CM

## 2017-12-20 DIAGNOSIS — R197 Diarrhea, unspecified: Secondary | ICD-10-CM

## 2017-12-20 LAB — BASIC METABOLIC PANEL
Anion gap: 11 (ref 5–15)
BUN: 7 mg/dL (ref 6–20)
CHLORIDE: 107 mmol/L (ref 101–111)
CO2: 24 mmol/L (ref 22–32)
CREATININE: 0.63 mg/dL (ref 0.44–1.00)
Calcium: 8.5 mg/dL — ABNORMAL LOW (ref 8.9–10.3)
GFR calc non Af Amer: >60 mL/min (ref >60)
Glucose, Bld: 100 mg/dL — ABNORMAL HIGH (ref 65–99)
Potassium: 3.4 mmol/L — ABNORMAL LOW (ref 3.5–5.1)
Sodium: 142 mmol/L (ref 135–145)

## 2017-12-20 LAB — CBC
HEMATOCRIT: 33.9 % — AB (ref 36.0–46.0)
HEMOGLOBIN: 10.2 g/dL — AB (ref 12.0–15.0)
MCH: 24.6 pg — AB (ref 26.0–34.0)
MCHC: 30.1 g/dL (ref 30.0–36.0)
MCV: 81.9 fL (ref 78.0–100.0)
Platelets: 174 10*3/uL (ref 150–400)
RBC: 4.14 MIL/uL (ref 3.87–5.11)
RDW: 14.8 % (ref 11.5–15.5)
WBC: 9.2 10*3/uL (ref 4.0–10.5)

## 2017-12-20 LAB — SODIUM
Sodium: 142 mmol/L (ref 135–145)
Sodium: 140 mmol/L (ref 135–145)
Sodium: 145 mmol/L (ref 135–145)
Sodium: 138 mmol/L (ref 135–145)

## 2017-12-20 LAB — ECHOCARDIOGRAM COMPLETE
Height: 63 in
WEIGHTICAEL: 4768.99 [oz_av]

## 2017-12-20 MED ORDER — TRAMADOL HCL 50 MG PO TABS
100.0000 mg | ORAL_TABLET | Freq: Four times a day (QID) | ORAL | Status: DC | PRN
  Administered 2017-12-20 – 2017-12-23 (×11): 100 mg via ORAL
  Filled 2017-12-20 (×11): qty 2

## 2017-12-20 MED ORDER — ORAL CARE MOUTH RINSE
15.0000 mL | Freq: Two times a day (BID) | OROMUCOSAL | Status: DC
  Administered 2017-12-20 – 2017-12-23 (×3): 15 mL via OROMUCOSAL

## 2017-12-20 MED ORDER — BUTALBITAL-APAP-CAFFEINE 50-325-40 MG PO TABS
1.0000 | ORAL_TABLET | Freq: Four times a day (QID) | ORAL | Status: DC | PRN
  Administered 2017-12-20 – 2017-12-22 (×8): 1 via ORAL
  Filled 2017-12-20 (×8): qty 1

## 2017-12-20 NOTE — Evaluation (Signed)
Occupational Therapy Evaluation Patient Details Name: Nicole [name] MRN: [id number] DOB: 17-Sep-1966 Today's Date: 12/20/2017    History of Present Illness 52 y.o. female admitted on 12/18/17 for dizziness and HA with resultant N/V/D.  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  Pt with significant PMH of low back pain with sciatica, migraines, HTN, asthma, and depression.   Clinical Impression   Pt admitted with above. She demonstrates the below listed deficits and will benefit from continued OT to maximize safety and independence with BADLs.  Pt presents to OT with severe headache, visual deficits (unable to fully assess due to severity of headache), impaired balance, decreased activity tolerance.  Eval was somewhat limited due to severity of pain.  She requires min A +2 for functional transfers, and min - total A for ADLs.  She lives with spouse and was independent with ADLs PTA.   Feel she will benefit from CIR to allow her to maximize safety and independence with ADLs prior to return home.  Will  follow acutely.       Follow Up Recommendations  CIR;Supervision/Assistance - 24 hour    Equipment Recommendations  3 in 1 bedside commode    Recommendations for Other Services Rehab consult     Precautions / Restrictions Precautions Precautions: Fall      Mobility Bed Mobility Overal bed mobility: Needs Assistance Bed Mobility: Supine to Sit;Sit to Supine     Supine to sit: Supervision;HOB elevated Sit to supine: Supervision   General bed mobility comments: Supervision to get EOB.  HOB mildly elevated to get up.    Transfers Overall transfer level: Needs assistance Equipment used: 2 person hand held assist Transfers: Sit to/from [employer] Sit to Stand: +2 physical assistance;Min assist Stand pivot transfers: +2 physical assistance;Min assist       General transfer  comment: Two person min assist for balance and stability.  Pt's eyes closed during the transfer due to HA and dizziness.  Attempted targeting education.  LEs supported by bed.     Balance Overall balance assessment: Needs assistance Sitting-balance support: Feet supported;Bilateral upper extremity supported;Single extremity supported Sitting balance-Leahy Scale: Fair     Standing balance support: Bilateral upper extremity supported Standing balance-Leahy Scale: Poor Standing balance comment: needs min assist in standing.                           ADL either performed or assessed with clinical judgement   ADL Overall ADL's : Needs assistance/impaired Eating/Feeding: Modified independent;Bed level   Grooming: Wash/dry hands;Wash/dry face;Oral care;Brushing hair;Minimal assistance;Sitting   Upper Body Bathing: Moderate assistance;Sitting   Lower Body Bathing: Maximal assistance;Sit to/from stand   Upper Body Dressing : Maximal assistance;Sitting   Lower Body Dressing: Total assistance;Sit to/from stand   Toilet Transfer: Minimal assistance;+2 for physical assistance;Stand-pivot;BSC   Toileting- Clothing Manipulation and Hygiene: Total assistance;Sit to/from stand       Functional mobility during ADLs: Minimal assistance;+2 for physical assistance General ADL Comments: limited by severe HA and dizziness      Vision Baseline Vision/History: (reports she is supposed to wear glasses, but doesn't ) Patient Visual Report: Blurring of vision;Other (comment)(she reports everything slides to the Rt ) Additional Comments: Pt unable to participate due to severity of headache      Perception Perception Perception Tested?: Yes   Praxis Praxis Praxis tested?: Within functional limits  Pertinent Vitals/Pain Pain Assessment: 0-10 Pain Score: 10-Worst pain ever Pain Location: head and behind left eye Pain Descriptors / Indicators: Aching Pain Intervention(s): Limited  activity within patient's tolerance;Monitored during session;Repositioned;Patient requesting pain meds-RN notified     Hand Dominance Right   Extremity/Trunk Assessment Upper Extremity Assessment Upper Extremity Assessment: Overall WFL for tasks assessed(limited eval due to severe HA )   Lower Extremity Assessment Lower Extremity Assessment: Defer to PT evaluation   Cervical / Trunk Assessment Cervical / Trunk Assessment: Normal   Communication Communication Communication: No difficulties   Cognition Arousal/Alertness: Awake/alert Behavior During Therapy: Anxious(fearful of pain, fearful of the stroke, and fearful of movin) Overall Cognitive Status: Within Functional Limits for tasks assessed(not specifica)                                     General Comments       Exercises     Shoulder Instructions      Home Living Family/patient expects to be discharged to:: Private residence Living Arrangements: Spouse/significant other(significant other is an Biomedical scientist and has flexibility) Available Help at Discharge: Family;Available 24 hours/day Type of Home: Apartment Home Access: Stairs to enter Entrance Stairs-Number of Steps: 5 Entrance Stairs-Rails: Right Home Layout: One level     Bathroom Shower/Tub: IT trainer: Standard     Home Equipment: None(was looking at getting a rollator due to chronic back pain)          Prior Functioning/Environment Level of Independence: Independent        Comments: Pt reports she has back pain that limits her at times, and she was considering the purchase of a rollator.  Limited driving, does not work, does household chores, cooking.         OT Problem List: Decreased strength;Decreased activity tolerance;Impaired balance (sitting and/or standing);Impaired vision/perception;Decreased safety awareness;Decreased knowledge of use of DME or AE;Obesity;Pain      OT  Treatment/Interventions: Self-care/ADL training;Neuromuscular education;DME and/or AE instruction;Therapeutic activities;Visual/perceptual remediation/compensation;Patient/family education;Balance training    OT Goals(Current goals can be found in the care plan section) Acute Rehab OT Goals Patient Stated Goal: to decrease her HA pain and get back to normal, see her dog Coco.  OT Goal Formulation: With patient Time For Goal Achievement: 01/03/18 Potential to Achieve Goals: Good ADL Goals Pt Will Perform Grooming: (P) with min guard assist;standing Pt Will Perform Upper Body Bathing: (P) with set-up;with supervision;sitting Pt Will Perform Lower Body Bathing: (P) with min guard assist;sit to/from stand Pt Will Perform Upper Body Dressing: (P) with set-up;with supervision;sitting Pt Will Perform Lower Body Dressing: (P) with min guard assist;sit to/from stand Pt Will Transfer to Toilet: (P) with min assist;ambulating;regular height toilet;bedside commode;grab bars Pt Will Perform Toileting - Clothing Manipulation and hygiene: (P) with min guard assist;sit to/from stand Additional ADL Goal #1: (P) Pt will participate in further visual assessment  OT Frequency: Min 2X/week   Barriers to D/C: Decreased caregiver support          Co-evaluation PT/OT/SLP Co-Evaluation/Treatment: Yes Reason for Co-Treatment: Complexity of the patient's impairments (multi-system involvement);For patient/therapist safety;To address functional/ADL transfers PT goals addressed during session: Mobility/safety with mobility;Balance OT goals addressed during session: ADL's and self-care      AM-PAC PT "6 Clicks" Daily Activity     Outcome Measure Help from another person eating meals?: None Help from another person taking care of personal grooming?: A Little  Help from another person toileting, which includes using toliet, bedpan, or urinal?: A Lot Help from another person bathing (including washing, rinsing,  drying)?: A Lot Help from another person to put on and taking off regular upper body clothing?: A Lot Help from another person to put on and taking off regular lower body clothing?: Total 6 Click Score: 14   End of Session Nurse Communication: Mobility status;Patient requests pain meds  Activity Tolerance: Patient limited by pain Patient left: in bed;with call bell/phone within reach  OT Visit Diagnosis: Unsteadiness on feet (R26.81);Dizziness and giddiness (R42);Pain Pain - part of body: (HA )                Time: 5035-4656 OT Time Calculation (min): 20 min Charges:  OT General Charges $OT Visit: 1 Visit OT Evaluation $OT Eval Moderate Complexity: 1 Mod G-Codes:     [employer], OTR/L [phone]   [name] M 12/20/2017, 4:16 PM

## 2017-12-20 NOTE — Progress Notes (Signed)
  Echocardiogram 2D Echocardiogram has been performed.  Nicole [name] 12/20/2017, 12:42 PM

## 2017-12-20 NOTE — Progress Notes (Addendum)
PT Cancellation Note  Patient Details Name: Kianna Levay MRN: [id number] DOB: 1966/08/30   Cancelled Treatment:    Reason Eval/Treat Not Completed: Medical issues which prohibited therapy.  Pt having an in room procedure.  PT to check back later today.   Thanks,    Rollene Rotunda. Chalise Pe, PT, DPT [phone]   12/20/2017, 11:57 AM

## 2017-12-20 NOTE — Progress Notes (Signed)
Notified stroke MD that pt's Na results are trending down - 0300 it was 142 and at 0900 the result was 140.  Will await for further interventions if necessary.  Francia Greaves, RN

## 2017-12-20 NOTE — Progress Notes (Signed)
Alerted on call neurology MD regarding pt.s dizziness.  Orders given to observe and to proceed with scheduled CT scan in morning.

## 2017-12-20 NOTE — Progress Notes (Signed)
Inpatient Rehabilitation  Per PT/OT request, patient was screened by Fae Pippin for appropriateness for an Inpatient Acute Rehab consult.  At this time we are recommending an Inpatient Rehab consult.  Text paged today's Critical Care MD; please order if you are agreeable.    Charlane Ferretti., CCC/SLP Admission Coordinator  [facility]  Cell [phone]

## 2017-12-20 NOTE — Progress Notes (Signed)
NEUROHOSPITALISTS STROKE TEAM - DAILY PROGRESS NOTE   ADMISSION HISTORY:  Nicole [name] is an 52 y.o. female who presented to the ED with severe occipital headache, N/V/D. Her PMHx is significant for asthma, obesity, migraines, HTN and HLD. She stated to the ED staff that she has had a migraine headache for the past 2 days. Further interview with neurology reveals that her symptoms began on Wednesday evening while eating dinner; she had abrupt onset of vertigo with nausea and vomiting; concomitantly, she developed a severe occipital headache, worse on the left. Denies neck pain and no preceding trauma or rapid neck extension, flexion or rotation. Since than, she has been unable to control the vomiting and the headache. She normally takes Tylenol for pain but the headache got worse. She denied to the ED staff URI symptoms, chest pain, SOB, cough, abdominal pain or urinary symptoms. She endorsed feeling extremely lightheaded. She stated to the ED staff that her headache felt like a typical migraine. She tried to take Imitrex and Zofran at home without relief.   Headache pain in the ED was rated as 9/10. After a migraine cocktail and IVF, she reported that her headache was improving and asked to go home. However, at time of d/c she stated that she continued to have dizziness and felt like she could not go home yet. A second bag of IV fluids was ordered, following which the patient again reported feeling better and stated that she would like to go home. At that time she tried to stand and walk and stated that the back of her head was with severe throbbing pain and she felt too dizzy to walk. She then reported that she had a fall 2 days ago and hit the back of her head with possible LOC. She also stated at that time that her presenting symptoms began after the fall.   LSN: Wednesday evening tPA Given: No: Out of the time window  SUBJECTIVE (INTERVAL  HISTORY) Her fiancee is at the bedside. Patient is found laying in bed in NAD. Overall she feels her condition is unchanged. Voices no new complaints. No new events reported overnight. Patient continues to complain of H/A and dizziness. Tramadol and Tylenol helped only partially. 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 MRA of the brain and neck do not show any significant high-grade distal left vertebral artery or basilar stenosis. The right vertebral artery terminates in the PICA. 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 and possible small a, aneurysm also noted    OBJECTIVE Lab Results: CBC:  Recent Labs  Lab 12/18/17 1416 12/20/17 0245  WBC 12.7* 9.2  HGB 12.4 10.2*  HCT 39.5 33.9*  MCV 80.3 81.9  PLT 212 174   BMP: Recent Labs  Lab 12/18/17 1416  12/19/17 1115 12/19/17 1817 12/19/17 2108 12/20/17 0245 12/20/17 0907  NA 131*   < > 145 141 142 142  142 140  K 3.5  --  3.4*  --   --  3.4*  --   CL 97*  --  112*  --   --  107  --   CO2 21*  --  23  --   --  24  --   GLUCOSE 137*  --  100*  --   --  100*  --   BUN 9  --  11  --   --  7  --   CREATININE 0.65  --  0.62  --   --  0.63  --   CALCIUM 9.2  --  8.1*  --   --  8.5*  --   MG  --   --  2.1  --   --   --   --    < > = values in this interval not displayed.   Liver Function Tests:  Recent Labs  Lab 12/18/17 1416  AST 22  ALT 16  ALKPHOS 105  BILITOT 0.9  PROT 8.6*  ALBUMIN 3.9   Coagulation Studies:  Recent Labs    12/19/17 0447  APTT 27  INR 1.14   Urinalysis:  Recent Labs  Lab 12/18/17 1455  COLORURINE AMBER*  APPEARANCEUR CLOUDY*  LABSPEC 1.028  PHURINE 6.0  GLUCOSEU NEGATIVE  HGBUR SMALL*  BILIRUBINUR NEGATIVE    KETONESUR NEGATIVE  PROTEINUR 100*  NITRITE NEGATIVE  LEUKOCYTESUR NEGATIVE   PHYSICAL EXAM Temp:  [97.7 F (36.5 C)-98.4 F (36.9 C)] 98.4 F (36.9 C) (02/05 1200) Pulse Rate:  [84-121] 84 (02/05 1000) Resp:  [17-24] 20 (02/05 1000) BP: (119-160)/(68-128) 135/87 (02/05 1250) SpO2:  [89 %-100 %] 95 % (02/05 1000) Weight:  [298 lb 1 oz (135.2 kg)] 298 lb 1 oz (135.2 kg) (02/05 0403) General - Well nourished, well developed middle aged lady, in no apparent distress HEENT-  Normocephalic,  Cardiovascular - Regular rate and rhythm  Respiratory - Lungs clear bilaterally. No wheezing. Abdomen - soft and non-tender, BS normal Extremities- no edema or cyanosis Neurologic Examination:  Mental Status: Awake and alert. Fully oriented. No dysarthria. Speech fluent with intact comprehension and naming. Able to comprehend all questions accurately. Normal insight, Concentration intact with ability to perform simple addition and accurately recite months of the year in order.  Cranial Nerves: II:  Visual fields intact to left and right. PERRL at 3 mm >> 2 mm.   III,IV, VI: Drift of eyes to right with prominent fast beating horizontal nystagmus to the left;  rotatory component present with upgaze. Able to cross midline to the left. Horzontal nystagmus is present in all directions of gaze, but decreases with downgaze.   V,VII: Smile symmetric, facial temp sensation decreased on the left VIII: hearing intact to voice IX,X: Unable to visualize palate due to severe nausea XI: Symmetric XII: Unable to assess tongue protrusion due to severe nausea Motor: Equal strength in all 4 limbs.  Maximum strength elicited in upper extremities is 4/5 proximally and distally in the context of nausea, vertigo and severe occipital headache. No asymmetry.  Knee extension and ADF/APF 4+/5 bilateral lower extremities without asymmetry.  Sensory: Temp decreased to LUE. Temp sensation normal to RUE and bilateral lower  extremities. FT intact x 4 without extinction.   Deep Tendon Reflexes:   Bilaterally symmetrical and easily elicitable Toes downgoing bilaterally Cerebellar: Dysmetria with slow FNF bilaterally. Heel-shin normal Gait: Not tested  IMAGING: I have personally reviewed the radiological images below and agree with the radiology interpretations. Ct Angio Head W Or Wo Contrast  Result Date: 12/19/2017 CLINICAL DATA:  Dizziness, fall 2 days ago with possible loss of consciousness. EXAM: CT ANGIOGRAPHY HEAD TECHNIQUE: Multidetector CT imaging of the head was performed using the standard protocol during bolus administration of intravenous contrast. Multiplanar CT image reconstructions and MIPs were obtained to evaluate the vascular anatomy. CONTRAST:  50mL ISOVUE-370  IOPAMIDOL (ISOVUE-370) INJECTION 76% COMPARISON:  None. FINDINGS: CT HEAD BRAIN: Large LEFT inferior cerebellar acute to subacute infarct. Additional small bilateral cerebellar infarcts. Cytotoxic edema resulting and inferior cerebellar tonsil herniation, effaced cerebral spinal fluid at the foramen magnum. Very mild prominence of the supratentorial ventricles. No intraparenchymal hemorrhage. No abnormal extra-axial fluid collections. VASCULAR: Unremarkable. SKULL/SOFT TISSUES: No skull fracture. No significant soft tissue swelling. ORBITS/SINUSES: The included ocular globes and orbital contents are normal.The mastoid aircells and included paranasal sinuses are well-aerated. OTHER: None. CTA HEAD-noisy image quality. ANTERIOR CIRCULATION: Patent cervical internal carotid arteries, petrous, cavernous and supra clinoid internal carotid arteries. Patent anterior communicating artery with 4 mm origin aneurysm versus infundibulum. Patent anterior and middle cerebral arteries. 5 mm bilobed LEFT MCA bifurcation. No large vessel occlusion, significant stenosis, contrast extravasation. POSTERIOR CIRCULATION: Tandem severe stenosis RIGHT posterior-inferior  cerebellar artery, non demonstrable LEFT posterior-inferior cerebellar artery. RIGHT distal V4 segment occluded. Central filling defect LEFT mid V4 segment with moderate stenosis. Patent basilar artery. Robust bilateral posterior communicating arteries, patent posterior cerebral arteries. No contrast extravasation or aneurysm. VENOUS SINUSES: Major dural venous sinuses are patent though not tailored for evaluation on this angiographic examination. ANATOMIC VARIANTS: None. DELAYED PHASE: Not performed. MIP images reviewed. IMPRESSION: CT HEAD: 1. 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 Additional acute small bilateral cerebellar infarcts. 2. Cerebellar edema resulting in inferior tonsillar herniation and early obstructive hydrocephalus. CTA HEAD: 1. Occluded LEFT PICA. Severe stenosis RIGHT PICA, secondary to dissection, less likely vaso spasm or atherosclerosis. 2. LEFT V4 thromboembolism resulting in moderate stenosis, dissection not excluded. 3. 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 4. Recommend CTA NECK. Critical Value/emergent results were called by telephone at the time of interpretation on 12/19/2017 at 2:10 am to PA. [name] , who verbally acknowledged these results. Electronically Signed   By: Awilda Metro M.D.   On: 12/19/2017 02:18   Dg Chest 2 View  Result Date: 12/18/2017 CLINICAL DATA:  Pt to ED via [facility] with flu like symptoms--started Wednesday-- saw PCP -- given meds for migraine, felt better Wednesday night, worse Thursday-- EXAM: CHEST  2 VIEW COMPARISON:  None. FINDINGS: The heart size and mediastinal contours are within normal limits. Both lungs are clear. The visualized skeletal structures are unremarkable. IMPRESSION: No active cardiopulmonary disease. Electronically Signed   By: Norva Pavlov M.D.   On: 12/18/2017 17:16   Ct Head Wo Contrast  Result Date: 12/19/2017 CLINICAL DATA:  Fatigue and malaise.  EXAM: CT HEAD WITHOUT CONTRAST TECHNIQUE: Contiguous axial images were obtained from the base of the skull through the vertex without intravenous contrast. COMPARISON:  CTA earlier today FINDINGS: Brain: Acute infarcts involving the left more than right cerebellum with cytotoxic edema causing posterior fossa mass effect. There is cerebellar tonsillar descent by 8-9 mm and fourth ventricular narrowing/effacement. Mild lateral ventriculomegaly, most convincing at the temporal horns. Negative for hemorrhage or visible interval infarct. Vascular: Recent CTA. No hyperdense vessel. There is atherosclerotic calcification of the left V4 segment. Skull: Negative Sinuses/Orbits: Large retention cyst in the left maxillary sinus. Mucosal thickening elsewhere. IMPRESSION: Acute infarcts in the left more than right cerebellum with posterior fossa mass effect and foramen magnum stenosis. Fourth ventricular narrowing with mild lateral ventriculomegaly. No progression from study earlier today. Electronically Signed   By: Marnee Spring M.D.   On: 12/19/2017 07:34   Mr Maxine Glenn Head Wo Contrast  Addendum Date: 12/19/2017   ADDENDUM REPORT: 12/19/2017 16:00 ADDENDUM: 20  cc MultiHance administered. Electronically Signed   By: Mitzi Hansen M.D.   On: 12/19/2017 16:00   Result Date: 12/19/2017 CLINICAL DATA:  52 y/o  F; acute left PICA distribution infarction. EXAM: MRI HEAD WITHOUT AND WITH CONTRAST MRA HEAD WITHOUT CONTRAST MRA NECK WITHOUT AND WITH CONTRAST TECHNIQUE: Multiplanar, multiecho pulse sequences of the brain and surrounding structures were obtained without and with intravenous contrast. Angiographic images of the Circle of Willis were obtained using MRA technique without intravenous contrast. Angiographic images of the neck were obtained using MRA technique without and with intravenous contrast. Carotid stenosis measurements (when applicable) are obtained utilizing NASCET criteria, using the distal internal  carotid diameter as the denominator. COMPARISON:  None. FINDINGS: MRI HEAD FINDINGS Severe motion degradation of multiple sequences. Brain: Reduced diffusion within the left inferior cerebellar hemisphere in PICA distribution compatible with acute/early subacute infarct additional very small areas of acute/early subacute infarction within the left superior cerebellar hemisphere, vermis, and right superior cerebellar hemisphere. Infarcts demonstrate T2 FLAIR hyperintense signal abnormality and mild local mass effect with partial effacement of the fourth ventricle, left quadrigeminal plate cistern, and downward cerebellar tonsillar descent. Motion degraded SWI sequence, no gross hemorrhage. Findings are stable in comparison with prior CT of the head given differences in technique. Stable mild enlargement of the lateral ventricles. No extra-axial collection no additional signal abnormality elsewhere in the brain. No abnormal enhancement of the brain. Vascular: As below. Skull and upper cervical spine: Normal marrow signal. Sinuses/Orbits: Patchy opacification of ethmoid sinuses, large left maxillary sinus mucous retention cyst, and diffuse paranasal sinus mucosal thickening. No abnormal signal of mastoid air cells. Orbits are unremarkable. Other: Negative. MRA HEAD FINDINGS Severe motion degradation. Internal carotid arteries:  Patent. Anterior cerebral arteries:  Patent. Middle cerebral arteries: Patent. Left MCA bifurcation fusiform aneurysm is poorly visualized on the current study due to motion artifact. Anterior communicating artery: Right A1/A-comm junction prominent infundibulum versus 3 x 3 mm aneurysm (series 4, image 75). Posterior communicating arteries:  Patent.  Bilateral fetal PCA. Posterior cerebral arteries:  Patent. Basilar artery:  Diffuse contour irregularity. Vertebral arteries: The right vertebral artery terminates in the right PICA and the downstream right vertebral artery is persistently  occluded. Unchanged irregularity of the left V4 segment probably corresponding to thrombus as seen on CTA. No appreciable left PICA. No additional large vessel occlusion or aneurysm identified. MRA NECK FINDINGS Severe motion degradation. Aortic arch: Patent. Right common carotid artery: Patent. Right internal carotid artery: Patent. There may be a mild degree of stenosis of the proximal right internal carotid artery. Right vertebral artery: Patent. Left common carotid artery: Patent. Left Internal carotid artery: Patent. Left Vertebral artery: Patent. There is no definite evidence of hemodynamically significant stenosis by NASCET criteria, occlusion, or aneurysm unless noted above. IMPRESSION: MRI head: 1. Severe motion artifact. 2. Left PICA distribution acute/early subacute infarction and multiple additional very small infarcts in the superior cerebellar hemispheres bilaterally with edema and local mass effect resulting in effacement of fourth ventricle, downward descent of cerebellar tonsils, and partial effacement of quadrigeminal plate cistern. No gross hemorrhage. Findings are stable from prior CT of head given differences in technique. 3. Mild to moderate paranasal sinus disease. MRA head: 1. Severe motion artifact. 2. Anterior communicating artery 3 mm prominent infundibulum versus aneurysm better characterized on prior CTA of head. 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 3. 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 4. Mid left V4 signal  irregularity probably corresponding to thrombus prior CTA. 5. No new large vessel occlusion or aneurysm identified. MRA neck: 1. Severe motion artifact. 2. Patent carotid and vertebral arteries of the neck. 3. Possible mild right proximal ICA stenosis, suboptimal assessment due to extensive motion artifact. Consider carotid Doppler or repeat CTA/MRA when patient is able to hold still. Electronically Signed:  By: Mitzi Hansen M.D. On: 12/19/2017 15:07   Mr Maxine Glenn Neck W Wo Contrast  Addendum Date: 12/19/2017   ADDENDUM REPORT: 12/19/2017 16:00 ADDENDUM: 20 cc MultiHance administered. Electronically Signed   By: Mitzi Hansen M.D.   On: 12/19/2017 16:00   Result Date: 12/19/2017 CLINICAL DATA:  52 y/o  F; acute left PICA distribution infarction. EXAM: MRI HEAD WITHOUT AND WITH CONTRAST MRA HEAD WITHOUT CONTRAST MRA NECK WITHOUT AND WITH CONTRAST TECHNIQUE: Multiplanar, multiecho pulse sequences of the brain and surrounding structures were obtained without and with intravenous contrast. Angiographic images of the Circle of Willis were obtained using MRA technique without intravenous contrast. Angiographic images of the neck were obtained using MRA technique without and with intravenous contrast. Carotid stenosis measurements (when applicable) are obtained utilizing NASCET criteria, using the distal internal carotid diameter as the denominator. COMPARISON:  None. FINDINGS: MRI HEAD FINDINGS Severe motion degradation of multiple sequences. Brain: Reduced diffusion within the left inferior cerebellar hemisphere in PICA distribution compatible with acute/early subacute infarct additional very small areas of acute/early subacute infarction within the left superior cerebellar hemisphere, vermis, and right superior cerebellar hemisphere. Infarcts demonstrate T2 FLAIR hyperintense signal abnormality and mild local mass effect with partial effacement of the fourth ventricle, left quadrigeminal plate cistern, and downward cerebellar tonsillar descent. Motion degraded SWI sequence, no gross hemorrhage. Findings are stable in comparison with prior CT of the head given differences in technique. Stable mild enlargement of the lateral ventricles. No extra-axial collection no additional signal abnormality elsewhere in the brain. No abnormal enhancement of the brain. Vascular: As below. Skull and upper cervical  spine: Normal marrow signal. Sinuses/Orbits: Patchy opacification of ethmoid sinuses, large left maxillary sinus mucous retention cyst, and diffuse paranasal sinus mucosal thickening. No abnormal signal of mastoid air cells. Orbits are unremarkable. Other: Negative. MRA HEAD FINDINGS Severe motion degradation. Internal carotid arteries:  Patent. Anterior cerebral arteries:  Patent. Middle cerebral arteries: Patent. Left MCA bifurcation fusiform aneurysm is poorly visualized on the current study due to motion artifact. Anterior communicating artery: Right A1/A-comm junction prominent infundibulum versus 3 x 3 mm aneurysm (series 4, image 75). Posterior communicating arteries:  Patent.  Bilateral fetal PCA. Posterior cerebral arteries:  Patent. Basilar artery:  Diffuse contour irregularity. Vertebral arteries: The right vertebral artery terminates in the right PICA and the downstream right vertebral artery is persistently occluded. Unchanged irregularity of the left V4 segment probably corresponding to thrombus as seen on CTA. No appreciable left PICA. No additional large vessel occlusion or aneurysm identified. MRA NECK FINDINGS Severe motion degradation. Aortic arch: Patent. Right common carotid artery: Patent. Right internal carotid artery: Patent. There may be a mild degree of stenosis of the proximal right internal carotid artery. Right vertebral artery: Patent. Left common carotid artery: Patent. Left Internal carotid artery: Patent. Left Vertebral artery: Patent. There is no definite evidence of hemodynamically significant stenosis by NASCET criteria, occlusion, or aneurysm unless noted above. IMPRESSION: MRI head: 1. Severe motion artifact. 2. Left PICA distribution acute/early subacute infarction and multiple additional very small infarcts in the superior cerebellar hemispheres bilaterally with edema and local mass effect resulting in effacement  of fourth ventricle, downward descent of cerebellar tonsils, and  partial effacement of quadrigeminal plate cistern. No gross hemorrhage. Findings are stable from prior CT of head given differences in technique. 3. Mild to moderate paranasal sinus disease. MRA head: 1. Severe motion artifact. 2. Anterior communicating artery 3 mm prominent infundibulum versus aneurysm better characterized on prior CTA of head. 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 3. 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 4. Mid left V4 signal irregularity probably corresponding to thrombus prior CTA. 5. No new large vessel occlusion or aneurysm identified. MRA neck: 1. Severe motion artifact. 2. Patent carotid and vertebral arteries of the neck. 3. Possible mild right proximal ICA stenosis, suboptimal assessment due to extensive motion artifact. Consider carotid Doppler or repeat CTA/MRA when patient is able to hold still. Electronically Signed: By: Mitzi Hansen M.D. On: 12/19/2017 15:07   Mr Laqueta Jean XL Contrast  Addendum Date: 12/19/2017   ADDENDUM REPORT: 12/19/2017 16:00 ADDENDUM: 20 cc MultiHance administered. Electronically Signed   By: Mitzi Hansen M.D.   On: 12/19/2017 16:00   Result Date: 12/19/2017 CLINICAL DATA:  52 y/o  F; acute left PICA distribution infarction. EXAM: MRI HEAD WITHOUT AND WITH CONTRAST MRA HEAD WITHOUT CONTRAST MRA NECK WITHOUT AND WITH CONTRAST TECHNIQUE: Multiplanar, multiecho pulse sequences of the brain and surrounding structures were obtained without and with intravenous contrast. Angiographic images of the Circle of Willis were obtained using MRA technique without intravenous contrast. Angiographic images of the neck were obtained using MRA technique without and with intravenous contrast. Carotid stenosis measurements (when applicable) are obtained utilizing NASCET criteria, using the distal internal carotid diameter as the denominator. COMPARISON:  None. FINDINGS: MRI HEAD  FINDINGS Severe motion degradation of multiple sequences. Brain: Reduced diffusion within the left inferior cerebellar hemisphere in PICA distribution compatible with acute/early subacute infarct additional very small areas of acute/early subacute infarction within the left superior cerebellar hemisphere, vermis, and right superior cerebellar hemisphere. Infarcts demonstrate T2 FLAIR hyperintense signal abnormality and mild local mass effect with partial effacement of the fourth ventricle, left quadrigeminal plate cistern, and downward cerebellar tonsillar descent. Motion degraded SWI sequence, no gross hemorrhage. Findings are stable in comparison with prior CT of the head given differences in technique. Stable mild enlargement of the lateral ventricles. No extra-axial collection no additional signal abnormality elsewhere in the brain. No abnormal enhancement of the brain. Vascular: As below. Skull and upper cervical spine: Normal marrow signal. Sinuses/Orbits: Patchy opacification of ethmoid sinuses, large left maxillary sinus mucous retention cyst, and diffuse paranasal sinus mucosal thickening. No abnormal signal of mastoid air cells. Orbits are unremarkable. Other: Negative. MRA HEAD FINDINGS Severe motion degradation. Internal carotid arteries:  Patent. Anterior cerebral arteries:  Patent. Middle cerebral arteries: Patent. Left MCA bifurcation fusiform aneurysm is poorly visualized on the current study due to motion artifact. Anterior communicating artery: Right A1/A-comm junction prominent infundibulum versus 3 x 3 mm aneurysm (series 4, image 75). Posterior communicating arteries:  Patent.  Bilateral fetal PCA. Posterior cerebral arteries:  Patent. Basilar artery:  Diffuse contour irregularity. Vertebral arteries: The right vertebral artery terminates in the right PICA and the downstream right vertebral artery is persistently occluded. Unchanged irregularity of the left V4 segment probably corresponding to  thrombus as seen on CTA. No appreciable left PICA. No additional large vessel occlusion or aneurysm identified. MRA NECK FINDINGS Severe motion degradation. Aortic arch: Patent. Right common carotid artery: Patent. Right internal carotid  artery: Patent. There may be a mild degree of stenosis of the proximal right internal carotid artery. Right vertebral artery: Patent. Left common carotid artery: Patent. Left Internal carotid artery: Patent. Left Vertebral artery: Patent. There is no definite evidence of hemodynamically significant stenosis by NASCET criteria, occlusion, or aneurysm unless noted above. IMPRESSION: MRI head: 1. Severe motion artifact. 2. Left PICA distribution acute/early subacute infarction and multiple additional very small infarcts in the superior cerebellar hemispheres bilaterally with edema and local mass effect resulting in effacement of fourth ventricle, downward descent of cerebellar tonsils, and partial effacement of quadrigeminal plate cistern. No gross hemorrhage. Findings are stable from prior CT of head given differences in technique. 3. Mild to moderate paranasal sinus disease. MRA head: 1. Severe motion artifact. 2. Anterior communicating artery 3 mm prominent infundibulum versus aneurysm better characterized on prior CTA of head. 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 3. 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 4. Mid left V4 signal irregularity probably corresponding to thrombus prior CTA. 5. No new large vessel occlusion or aneurysm identified. MRA neck: 1. Severe motion artifact. 2. Patent carotid and vertebral arteries of the neck. 3. Possible mild right proximal ICA stenosis, suboptimal assessment due to extensive motion artifact. Consider carotid Doppler or repeat CTA/MRA when patient is able to hold still. Electronically Signed: By: Mitzi Hansen M.D. On: 12/19/2017 15:07   Dg Chest Port 1  View  Result Date: 12/19/2017 CLINICAL DATA:  Femoral line placement. EXAM: PORTABLE CHEST 1 VIEW COMPARISON:  Chest radiograph December 18, 2017 FINDINGS: The cardiac silhouette is upper limits of normal in size, mediastinal silhouette is not suspicious. No pleural effusion or focal consolidation. No pneumothorax. Soft tissue planes and included osseous structures are non suspicious. IMPRESSION: Borderline cardiomegaly.  No acute pulmonary process. Electronically Signed   By: Awilda Metro M.D.   On: 12/19/2017 06:11    Echocardiogram:                                              PENDING MRI/MRA Head/Neck                                        PENDING     IMPRESSION: Nicole [name] is a 52 y.o. 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 CTA head shows occluded LEFT PICA, LEFT V4 thromboembolism resulting in moderate stenosis with dissection not excluded, and severe stenosis of RIGHT PICA, secondary to dissection versus vasospasm or atherosclerosis.  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  Resultant Symptoms: H/A, N/V Stroke Risk Factors: hyperlipidemia and hypertension Other Stroke Risk Factors: Morbid Obesity, Body mass index is 52.8 kg/m.  Migraines, OSA/CPAP  Outstanding Stroke Work-up Studies:     Echocardiogram:  PENDING MRI/MRA Head/Neck                                               PENDING  12/20/2017 ASSESSMENT:   Neuro exam remains stable. Central line placed overnight. 23.4% saline to be started this AM.  PLAN  12/20/2017: Continue Statin Continue ASA 81 mg, Nurse to verify allergy/intolerance is just nausea Head of bed 30-45  degrees. SBP Goal less than 180 Medicate for H/A pain, without over sedating Frequent neuro checks Telemetry monitoring PT/OT/SLP Consult PM & Rehab Consult Case Management /MSW Ongoing aggressive stroke risk factor management Patient counseled to be compliant with her medications Patient counseled on Lifestyle modifications including, Diet, Exercise, and Stress Follow up with [facility] in 6 weeks  DYSPHAGIA: Passes SLP swallow evaluation Aspiration Precautions in progress  CEREBRAL EDEMA: 23.4% NS Infusion per order set. Sodium goal 150-155. Na Levels every 6 hrs Neurosurgery consulted, Dr Yetta Barre - no need for emergent intervention at this time High risk for respiratory compromise & progression of cerebral edema next 2-3 days. Peak swelling from strokes is usually 3-5 days. At risk for sudden decompensation and death from acute noncommunicating hydrocephalus and/or brainstem compression/herniation Considerstatneurosurgical consultation for hemicraniectomy for mentation worsens or if there is any imaging evidence of herniation  HYPERTENSION: Stable SBP goal of < 180. DBP goal of < 105.  PRN Labetalol, Cleviprex infusion discontinued Long term BP goal normotensive. Restart home B/P medications - 12/19/2017 Home Meds: Metoprolol, HCTZ, Cardizem   HX of OSA/CPAP RT consulted  HYPERLIPIDEMIA:    Component Value Date/Time   CHOL 140 12/19/2017 0930   TRIG 107 12/19/2017 0930   HDL 34 (L) 12/19/2017 0930   CHOLHDL 4.1 12/19/2017 0930   VLDL 21 12/19/2017 0930   LDLCALC 85 12/19/2017 0930  Home Meds:  Lipitor 40 mg LDL  goal < 70 Increased to Lipitor to 80 mg daily Continue statin at discharge  R/O DIABETES: Lab Results  Component Value Date   HGBA1C 6.3 (H) 12/19/2017    PENDING No results for input(s): GLUCAP in the last 168 hours. HgbA1c goal < 7.0 Continue CBG monitoring and SSI to maintain glucose 140-180 mg/dl DM education, if necessary    OBESITY Morbid Obesity, Body mass index is 52.8 kg/m. Greater than/equal to 30  Other Active Problems: Active Problems:   New cerebellar infarct (HCC)   Encounter for central line placement   Nausea vomiting and diarrhea    Hospital day # 1 VTE prophylaxis: SCD's  Diet : Aspiration precautions Seizure precautions Diet heart healthy/carb modified Room service appropriate? Yes; Fluid consistency: Thin   FAMILY UPDATES: family at bedside  TEAM UPDATES: Scatliffe, Gypsy Balsam, MD   Prior Home Stroke Medications:  ASA 81 mg, not taking daily Discharge Stroke Meds:  Please discharge patient on TBD, will need to verify if patient has a true allery to ASA vs an intolerance    Disposition: 07-Left Against Medical Advice/Left Without Being Seen/Elopement Therapy Recs:               PENDING Follow Up:  Follow-up Information    Micki Riley, MD. Schedule an appointment as soon as possible for a visit in 6 week(s).   Specialties:  Neurology, Radiology Contact information: [address] [place] [address] [phone]  System, Provider Not In -PCP Follow up in 1-2 weeks   Case Management aware of need     I have personally examined this patient, reviewed notes, independently viewed imaging studies, participated in medical decision making and plan of care.ROS completed by me personally and pertinent positives fully documented  I have made any additions or clarifications directly to the above note.    She presented with large left cerebellar infarct with cytotoxic edema and mass effect on the fourth ventricle and mild hydrocephalus. She remains at risk for neurological worsening. Continue hypertonic saline with sodium goal of 150-155. Close neurological monitoring and neurosurgical follow-up. Long discussion with the patient and answered questions.  Plan discontinue femoral central line in place PICC line if needed. Mobilize out of bed. Therapy consults.  Increase dose of tramadol 200 mg every 6 hourly as needed. The patient may need TEE and prolonged cardiac monitoring later to find source of embolism.  This patient is critically ill and at significant risk of neurological worsening, death and care requires constant monitoring of vital signs, hemodynamics,respiratory and cardiac monitoring, extensive review of multiple databases, frequent neurological assessment, discussion with family, other specialists and medical decision making of high complexity.I have made any additions or clarifications directly to the above note.This critical care time does not reflect procedure time, or teaching time or supervisory time of PA/NP/Med Resident etc but could involve care discussion time.  I spent 32 minutes of neurocritical care time  in the care of  this patient.      Delia Heady, MD Medical Director [facility] Pager: [phone] 12/20/2017 12:55 PM  To contact Stroke Continuity provider, please refer to [email or web address]. After hours, contact General Neurology

## 2017-12-20 NOTE — Progress Notes (Signed)
.. ..  Name: Nicole [name] MRN: [id number] DOB: 1966/03/17    ADMISSION DATE:  12/18/2017 CONSULTATION DATE:  12/19/17  REFERRING MD :  Otelia Limes MD NEUROLOGY  CHIEF COMPLAINT:  PERSISTENT DIZZINESS, FALL, NAUSEA  BRIEF PATIENT DESCRIPTION: 52 yr old female with hx of Migraines presented 2/3 with N/V/D and dizziness initially symptoms improved post fluids and migraine cocktail but pt began to have worsening dizziness and headaches. 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 CTA confirms occlusion of left PICA and severe stenosis of Right. Neurology and Neurosurgery ware and managing patient. PCCM consulted for hyperosmolar therapy and frequent Neurochecks due to high risk of decompensation  SUBJECTIVE:  Pt reports nausea, headache this am.  RN reports Na 140.  No acute changes overnight.    VITAL SIGNS: Temp:  [97.7 F (36.5 C)-98.7 F (37.1 C)] 98.4 F (36.9 C) (02/05 0800) Pulse Rate:  [84-121] 84 (02/05 1000) Resp:  [16-24] 20 (02/05 1000) BP: (119-162)/(68-128) 141/82 (02/05 1000) SpO2:  [89 %-100 %] 95 % (02/05 1000) Weight:  [298 lb 1 oz (135.2 kg)] 298 lb 1 oz (135.2 kg) (02/05 0403)  PHYSICAL EXAMINATION: General: obese female in NAD, lying in bed HEENT: MM pink/moist PSY: calm/appropriate, MAE Neuro: AAOx4, speech clear, MAE CV: s1s2 rrr, no m/r/g PULM: even/non-labored, lungs bilaterally clear  WU:JWJX, non-tender, bsx4 active  Extremities: warm/dry, no edema  Skin: no rashes or lesions   Recent Labs  Lab 12/18/17 1416  12/19/17 1115  12/19/17 2108 12/20/17 0245 12/20/17 0907  NA 131*   < > 145   < > 142 142  142 140  K 3.5  --  3.4*  --   --  3.4*  --   CL 97*  --  112*  --   --  107  --   CO2 21*  --  23  --   --  24  --   BUN 9  --  11  --   --  7  --   CREATININE 0.65  --  0.62  --   --  0.63  --   GLUCOSE 137*  --  100*  --   --  100*  --    < > = values in this interval not displayed.   Recent Labs  Lab  12/18/17 1416 12/20/17 0245  HGB 12.4 10.2*  HCT 39.5 33.9*  WBC 12.7* 9.2  PLT 212 174   Ct Angio Head W Or Wo Contrast  Result Date: 12/19/2017 CLINICAL DATA:  Dizziness, fall 2 days ago with possible loss of consciousness. EXAM: CT ANGIOGRAPHY HEAD TECHNIQUE: Multidetector CT imaging of the head was performed using the standard protocol during bolus administration of intravenous contrast. Multiplanar CT image reconstructions and MIPs were obtained to evaluate the vascular anatomy. CONTRAST:  50mL ISOVUE-370 IOPAMIDOL (ISOVUE-370) INJECTION 76% COMPARISON:  None. FINDINGS: CT HEAD BRAIN: Large LEFT inferior cerebellar acute to subacute infarct. Additional small bilateral cerebellar infarcts. Cytotoxic edema resulting and inferior cerebellar tonsil herniation, effaced cerebral spinal fluid at the foramen magnum. Very mild prominence of the supratentorial ventricles. No intraparenchymal hemorrhage. No abnormal extra-axial fluid collections. VASCULAR: Unremarkable. SKULL/SOFT TISSUES: No skull fracture. No significant soft tissue swelling. ORBITS/SINUSES: The included ocular globes and orbital contents are normal.The mastoid aircells and included paranasal sinuses are well-aerated. OTHER: None. CTA HEAD-noisy image quality. ANTERIOR CIRCULATION: Patent cervical internal carotid arteries, petrous, cavernous and supra clinoid internal carotid arteries. Patent anterior communicating artery with 4 mm  origin aneurysm versus infundibulum. Patent anterior and middle cerebral arteries. 5 mm bilobed LEFT MCA bifurcation. No large vessel occlusion, significant stenosis, contrast extravasation. POSTERIOR CIRCULATION: Tandem severe stenosis RIGHT posterior-inferior cerebellar artery, non demonstrable LEFT posterior-inferior cerebellar artery. RIGHT distal V4 segment occluded. Central filling defect LEFT mid V4 segment with moderate stenosis. Patent basilar artery. Robust bilateral posterior communicating arteries,  patent posterior cerebral arteries. No contrast extravasation or aneurysm. VENOUS SINUSES: Major dural venous sinuses are patent though not tailored for evaluation on this angiographic examination. ANATOMIC VARIANTS: None. DELAYED PHASE: Not performed. MIP images reviewed. IMPRESSION: CT HEAD: 1. 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 Additional acute small bilateral cerebellar infarcts. 2. Cerebellar edema resulting in inferior tonsillar herniation and early obstructive hydrocephalus. CTA HEAD: 1. Occluded LEFT PICA. Severe stenosis RIGHT PICA, secondary to dissection, less likely vaso spasm or atherosclerosis. 2. LEFT V4 thromboembolism resulting in moderate stenosis, dissection not excluded. 3. 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 4. Recommend CTA NECK. Critical Value/emergent results were called by telephone at the time of interpretation on 12/19/2017 at 2:10 am to PA. [name] , who verbally acknowledged these results. Electronically Signed   By: Awilda Metro M.D.   On: 12/19/2017 02:18   Dg Chest 2 View  Result Date: 12/18/2017 CLINICAL DATA:  Pt to ED via [facility] with flu like symptoms--started Wednesday-- saw PCP -- given meds for migraine, felt better Wednesday night, worse Thursday-- EXAM: CHEST  2 VIEW COMPARISON:  None. FINDINGS: The heart size and mediastinal contours are within normal limits. Both lungs are clear. The visualized skeletal structures are unremarkable. IMPRESSION: No active cardiopulmonary disease. Electronically Signed   By: Norva Pavlov M.D.   On: 12/18/2017 17:16   Ct Head Wo Contrast  Result Date: 12/19/2017 CLINICAL DATA:  Fatigue and malaise. EXAM: CT HEAD WITHOUT CONTRAST TECHNIQUE: Contiguous axial images were obtained from the base of the skull through the vertex without intravenous contrast. COMPARISON:  CTA earlier today FINDINGS: Brain: Acute infarcts involving the left more than right  cerebellum with cytotoxic edema causing posterior fossa mass effect. There is cerebellar tonsillar descent by 8-9 mm and fourth ventricular narrowing/effacement. Mild lateral ventriculomegaly, most convincing at the temporal horns. Negative for hemorrhage or visible interval infarct. Vascular: Recent CTA. No hyperdense vessel. There is atherosclerotic calcification of the left V4 segment. Skull: Negative Sinuses/Orbits: Large retention cyst in the left maxillary sinus. Mucosal thickening elsewhere. IMPRESSION: Acute infarcts in the left more than right cerebellum with posterior fossa mass effect and foramen magnum stenosis. Fourth ventricular narrowing with mild lateral ventriculomegaly. No progression from study earlier today. Electronically Signed   By: Marnee Spring M.D.   On: 12/19/2017 07:34   Mr Maxine Glenn Head Wo Contrast  Addendum Date: 12/19/2017   ADDENDUM REPORT: 12/19/2017 16:00 ADDENDUM: 20 cc MultiHance administered. Electronically Signed   By: Mitzi Hansen M.D.   On: 12/19/2017 16:00   Result Date: 12/19/2017 CLINICAL DATA:  52 y/o  F; acute left PICA distribution infarction. EXAM: MRI HEAD WITHOUT AND WITH CONTRAST MRA HEAD WITHOUT CONTRAST MRA NECK WITHOUT AND WITH CONTRAST TECHNIQUE: Multiplanar, multiecho pulse sequences of the brain and surrounding structures were obtained without and with intravenous contrast. Angiographic images of the Circle of Willis were obtained using MRA technique without intravenous contrast. Angiographic images of the neck were obtained using MRA technique without and with intravenous contrast. Carotid stenosis measurements (when applicable) are obtained utilizing NASCET criteria, using the distal internal  carotid diameter as the denominator. COMPARISON:  None. FINDINGS: MRI HEAD FINDINGS Severe motion degradation of multiple sequences. Brain: Reduced diffusion within the left inferior cerebellar hemisphere in PICA distribution compatible with acute/early  subacute infarct additional very small areas of acute/early subacute infarction within the left superior cerebellar hemisphere, vermis, and right superior cerebellar hemisphere. Infarcts demonstrate T2 FLAIR hyperintense signal abnormality and mild local mass effect with partial effacement of the fourth ventricle, left quadrigeminal plate cistern, and downward cerebellar tonsillar descent. Motion degraded SWI sequence, no gross hemorrhage. Findings are stable in comparison with prior CT of the head given differences in technique. Stable mild enlargement of the lateral ventricles. No extra-axial collection no additional signal abnormality elsewhere in the brain. No abnormal enhancement of the brain. Vascular: As below. Skull and upper cervical spine: Normal marrow signal. Sinuses/Orbits: Patchy opacification of ethmoid sinuses, large left maxillary sinus mucous retention cyst, and diffuse paranasal sinus mucosal thickening. No abnormal signal of mastoid air cells. Orbits are unremarkable. Other: Negative. MRA HEAD FINDINGS Severe motion degradation. Internal carotid arteries:  Patent. Anterior cerebral arteries:  Patent. Middle cerebral arteries: Patent. Left MCA bifurcation fusiform aneurysm is poorly visualized on the current study due to motion artifact. Anterior communicating artery: Right A1/A-comm junction prominent infundibulum versus 3 x 3 mm aneurysm (series 4, image 75). Posterior communicating arteries:  Patent.  Bilateral fetal PCA. Posterior cerebral arteries:  Patent. Basilar artery:  Diffuse contour irregularity. Vertebral arteries: The right vertebral artery terminates in the right PICA and the downstream right vertebral artery is persistently occluded. Unchanged irregularity of the left V4 segment probably corresponding to thrombus as seen on CTA. No appreciable left PICA. No additional large vessel occlusion or aneurysm identified. MRA NECK FINDINGS Severe motion degradation. Aortic arch: Patent.  Right common carotid artery: Patent. Right internal carotid artery: Patent. There may be a mild degree of stenosis of the proximal right internal carotid artery. Right vertebral artery: Patent. Left common carotid artery: Patent. Left Internal carotid artery: Patent. Left Vertebral artery: Patent. There is no definite evidence of hemodynamically significant stenosis by NASCET criteria, occlusion, or aneurysm unless noted above. IMPRESSION: MRI head: 1. Severe motion artifact. 2. Left PICA distribution acute/early subacute infarction and multiple additional very small infarcts in the superior cerebellar hemispheres bilaterally with edema and local mass effect resulting in effacement of fourth ventricle, downward descent of cerebellar tonsils, and partial effacement of quadrigeminal plate cistern. No gross hemorrhage. Findings are stable from prior CT of head given differences in technique. 3. Mild to moderate paranasal sinus disease. MRA head: 1. Severe motion artifact. 2. Anterior communicating artery 3 mm prominent infundibulum versus aneurysm better characterized on prior CTA of head. 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 3. 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 4. Mid left V4 signal irregularity probably corresponding to thrombus prior CTA. 5. No new large vessel occlusion or aneurysm identified. MRA neck: 1. Severe motion artifact. 2. Patent carotid and vertebral arteries of the neck. 3. Possible mild right proximal ICA stenosis, suboptimal assessment due to extensive motion artifact. Consider carotid Doppler or repeat CTA/MRA when patient is able to hold still. Electronically Signed: By: Mitzi Hansen M.D. On: 12/19/2017 15:07   Mr Maxine Glenn Neck W Wo Contrast  Addendum Date: 12/19/2017   ADDENDUM REPORT: 12/19/2017 16:00 ADDENDUM: 20 cc MultiHance administered. Electronically Signed   By: Mitzi Hansen M.D.   On:  12/19/2017 16:00   Result Date: 12/19/2017 CLINICAL DATA:  52 y/o  F; acute left PICA distribution infarction. EXAM: MRI HEAD WITHOUT AND WITH CONTRAST MRA HEAD WITHOUT CONTRAST MRA NECK WITHOUT AND WITH CONTRAST TECHNIQUE: Multiplanar, multiecho pulse sequences of the brain and surrounding structures were obtained without and with intravenous contrast. Angiographic images of the Circle of Willis were obtained using MRA technique without intravenous contrast. Angiographic images of the neck were obtained using MRA technique without and with intravenous contrast. Carotid stenosis measurements (when applicable) are obtained utilizing NASCET criteria, using the distal internal carotid diameter as the denominator. COMPARISON:  None. FINDINGS: MRI HEAD FINDINGS Severe motion degradation of multiple sequences. Brain: Reduced diffusion within the left inferior cerebellar hemisphere in PICA distribution compatible with acute/early subacute infarct additional very small areas of acute/early subacute infarction within the left superior cerebellar hemisphere, vermis, and right superior cerebellar hemisphere. Infarcts demonstrate T2 FLAIR hyperintense signal abnormality and mild local mass effect with partial effacement of the fourth ventricle, left quadrigeminal plate cistern, and downward cerebellar tonsillar descent. Motion degraded SWI sequence, no gross hemorrhage. Findings are stable in comparison with prior CT of the head given differences in technique. Stable mild enlargement of the lateral ventricles. No extra-axial collection no additional signal abnormality elsewhere in the brain. No abnormal enhancement of the brain. Vascular: As below. Skull and upper cervical spine: Normal marrow signal. Sinuses/Orbits: Patchy opacification of ethmoid sinuses, large left maxillary sinus mucous retention cyst, and diffuse paranasal sinus mucosal thickening. No abnormal signal of mastoid air cells. Orbits are unremarkable. Other:  Negative. MRA HEAD FINDINGS Severe motion degradation. Internal carotid arteries:  Patent. Anterior cerebral arteries:  Patent. Middle cerebral arteries: Patent. Left MCA bifurcation fusiform aneurysm is poorly visualized on the current study due to motion artifact. Anterior communicating artery: Right A1/A-comm junction prominent infundibulum versus 3 x 3 mm aneurysm (series 4, image 75). Posterior communicating arteries:  Patent.  Bilateral fetal PCA. Posterior cerebral arteries:  Patent. Basilar artery:  Diffuse contour irregularity. Vertebral arteries: The right vertebral artery terminates in the right PICA and the downstream right vertebral artery is persistently occluded. Unchanged irregularity of the left V4 segment probably corresponding to thrombus as seen on CTA. No appreciable left PICA. No additional large vessel occlusion or aneurysm identified. MRA NECK FINDINGS Severe motion degradation. Aortic arch: Patent. Right common carotid artery: Patent. Right internal carotid artery: Patent. There may be a mild degree of stenosis of the proximal right internal carotid artery. Right vertebral artery: Patent. Left common carotid artery: Patent. Left Internal carotid artery: Patent. Left Vertebral artery: Patent. There is no definite evidence of hemodynamically significant stenosis by NASCET criteria, occlusion, or aneurysm unless noted above. IMPRESSION: MRI head: 1. Severe motion artifact. 2. Left PICA distribution acute/early subacute infarction and multiple additional very small infarcts in the superior cerebellar hemispheres bilaterally with edema and local mass effect resulting in effacement of fourth ventricle, downward descent of cerebellar tonsils, and partial effacement of quadrigeminal plate cistern. No gross hemorrhage. Findings are stable from prior CT of head given differences in technique. 3. Mild to moderate paranasal sinus disease. MRA head: 1. Severe motion artifact. 2. Anterior communicating  artery 3 mm prominent infundibulum versus aneurysm better characterized on prior CTA of head. 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 3. 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 4. Mid left V4 signal irregularity probably corresponding to thrombus prior CTA. 5. No new large vessel occlusion or aneurysm identified. MRA neck: 1. Severe motion artifact. 2. Patent carotid  and vertebral arteries of the neck. 3. Possible mild right proximal ICA stenosis, suboptimal assessment due to extensive motion artifact. Consider carotid Doppler or repeat CTA/MRA when patient is able to hold still. Electronically Signed: By: Mitzi Hansen M.D. On: 12/19/2017 15:07   Mr Laqueta Jean ZO Contrast  Addendum Date: 12/19/2017   ADDENDUM REPORT: 12/19/2017 16:00 ADDENDUM: 20 cc MultiHance administered. Electronically Signed   By: Mitzi Hansen M.D.   On: 12/19/2017 16:00   Result Date: 12/19/2017 CLINICAL DATA:  52 y/o  F; acute left PICA distribution infarction. EXAM: MRI HEAD WITHOUT AND WITH CONTRAST MRA HEAD WITHOUT CONTRAST MRA NECK WITHOUT AND WITH CONTRAST TECHNIQUE: Multiplanar, multiecho pulse sequences of the brain and surrounding structures were obtained without and with intravenous contrast. Angiographic images of the Circle of Willis were obtained using MRA technique without intravenous contrast. Angiographic images of the neck were obtained using MRA technique without and with intravenous contrast. Carotid stenosis measurements (when applicable) are obtained utilizing NASCET criteria, using the distal internal carotid diameter as the denominator. COMPARISON:  None. FINDINGS: MRI HEAD FINDINGS Severe motion degradation of multiple sequences. Brain: Reduced diffusion within the left inferior cerebellar hemisphere in PICA distribution compatible with acute/early subacute infarct additional very small areas of acute/early subacute infarction  within the left superior cerebellar hemisphere, vermis, and right superior cerebellar hemisphere. Infarcts demonstrate T2 FLAIR hyperintense signal abnormality and mild local mass effect with partial effacement of the fourth ventricle, left quadrigeminal plate cistern, and downward cerebellar tonsillar descent. Motion degraded SWI sequence, no gross hemorrhage. Findings are stable in comparison with prior CT of the head given differences in technique. Stable mild enlargement of the lateral ventricles. No extra-axial collection no additional signal abnormality elsewhere in the brain. No abnormal enhancement of the brain. Vascular: As below. Skull and upper cervical spine: Normal marrow signal. Sinuses/Orbits: Patchy opacification of ethmoid sinuses, large left maxillary sinus mucous retention cyst, and diffuse paranasal sinus mucosal thickening. No abnormal signal of mastoid air cells. Orbits are unremarkable. Other: Negative. MRA HEAD FINDINGS Severe motion degradation. Internal carotid arteries:  Patent. Anterior cerebral arteries:  Patent. Middle cerebral arteries: Patent. Left MCA bifurcation fusiform aneurysm is poorly visualized on the current study due to motion artifact. Anterior communicating artery: Right A1/A-comm junction prominent infundibulum versus 3 x 3 mm aneurysm (series 4, image 75). Posterior communicating arteries:  Patent.  Bilateral fetal PCA. Posterior cerebral arteries:  Patent. Basilar artery:  Diffuse contour irregularity. Vertebral arteries: The right vertebral artery terminates in the right PICA and the downstream right vertebral artery is persistently occluded. Unchanged irregularity of the left V4 segment probably corresponding to thrombus as seen on CTA. No appreciable left PICA. No additional large vessel occlusion or aneurysm identified. MRA NECK FINDINGS Severe motion degradation. Aortic arch: Patent. Right common carotid artery: Patent. Right internal carotid artery: Patent. There  may be a mild degree of stenosis of the proximal right internal carotid artery. Right vertebral artery: Patent. Left common carotid artery: Patent. Left Internal carotid artery: Patent. Left Vertebral artery: Patent. There is no definite evidence of hemodynamically significant stenosis by NASCET criteria, occlusion, or aneurysm unless noted above. IMPRESSION: MRI head: 1. Severe motion artifact. 2. Left PICA distribution acute/early subacute infarction and multiple additional very small infarcts in the superior cerebellar hemispheres bilaterally with edema and local mass effect resulting in effacement of fourth ventricle, downward descent of cerebellar tonsils, and partial effacement of quadrigeminal plate cistern. No gross hemorrhage. Findings are stable from prior CT of head  given differences in technique. 3. Mild to moderate paranasal sinus disease. MRA head: 1. Severe motion artifact. 2. Anterior communicating artery 3 mm prominent infundibulum versus aneurysm better characterized on prior CTA of head. 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 3. 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 4. Mid left V4 signal irregularity probably corresponding to thrombus prior CTA. 5. No new large vessel occlusion or aneurysm identified. MRA neck: 1. Severe motion artifact. 2. Patent carotid and vertebral arteries of the neck. 3. Possible mild right proximal ICA stenosis, suboptimal assessment due to extensive motion artifact. Consider carotid Doppler or repeat CTA/MRA when patient is able to hold still. Electronically Signed: By: Mitzi Hansen M.D. On: 12/19/2017 15:07   Dg Chest Port 1 View  Result Date: 12/19/2017 CLINICAL DATA:  Femoral line placement. EXAM: PORTABLE CHEST 1 VIEW COMPARISON:  Chest radiograph December 18, 2017 FINDINGS: The cardiac silhouette is upper limits of normal in size, mediastinal silhouette is not suspicious. No pleural  effusion or focal consolidation. No pneumothorax. Soft tissue planes and included osseous structures are non suspicious. IMPRESSION: Borderline cardiomegaly.  No acute pulmonary process. Electronically Signed   By: Awilda Metro M.D.   On: 12/19/2017 06:11   SIGNIFICANT EVENTS  2/03  Admit with dizziness, CTH with acute cerebellar infarct with mass effect and initial herniation  STUDIES:  CT Head 2/4 >> Acute large LEFT posterior-inferior cerebellar artery territory nonhemorrhagic infarct. Additional acute small bilateral cerebellar infarcts. Cerebellar edema resulting in inferior tonsillar herniation and early obstructive hydrocephalus.  CTA HEAD 2/4 >> Occluded LEFT PICA. Severe stenosis RIGHT PICA, secondary to dissection, less likely vaso spasm or atherosclerosis. LEFT V4 thromboembolism resulting in moderate stenosis, dissection not excluded. 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 Recommend CTA NECK.  ASSESSMENT / PLAN:   NEURO A: Acute left PICA occlusion - Left V4 thromboembolism-> stenosis and dissection with severe Right PICA stenosis (secondary to dissection vs vasospasm or atherosclerosis) with mass effect on 4th ventricle and medulla with early signs of obstructive hydrocephalus inferior tonsillar herniation High risk for decompensation from acute non communicating hydrocephalus vs compression/herniation - high chance of fatality P: Frequent neuro checks ASA  (not an allergy, was instructed not to take due to pending gastric bypass) No anticoagulation due to high risk hemorrhagic conversion  3% NS per Neuro  Defer plavix to neuro   CARDIAC A: H/o HTN - not a candidate for permissive HTN P:  Establish PIV then discontinue TLC for 3% Continue lipitor, cardizem, lasix, HCTZ, toprolol  PRN labetalol  ICU monitoring   PULMONARY A: Hx OSA - on CPAP  At Risk Aspiration  P: Monitor pulmonary status  O2 if needed to support sats > 92% HOB 30-45  degrees Continue nocturnal CPAP   ID A: Low suspicion for infection  P:  Monitor cultures   ENDOCRINE A: At Risk Hypo/Hyperglyemia  P: Monitor glucose on BMP   GI A: Severe nausea- vertigo secondary to increased ICP/ Tonsilar herniation/cerebellar infarct P: HOB > 30 degrees Aspiration precautions  PRN zofran   HEME A: Trend CBC  SCD's for DVT prophylaxis  ASA PR, ok per Neuro   RENAL A: NGMA - resolved  P: Trend BMP / urinary output Replace electrolytes as indicated Avoid nephrotoxic agents, ensure adequate renal perfusion   Canary Brim, NP-C [facility] Pgr: [phone] or if no answer [phone] 12/20/2017, 11:04 AM  Attending:  I  have seen and examined the patient with nurse practitioner/resident and agree with the note above.  We formulated the plan together and I elicited the following history.    Subjective: Awake and alert.   Objective: Vitals:   12/20/17 1200 12/20/17 1230 12/20/17 1250 12/20/17 1300  BP: 129/78 135/87 135/87 (!) 155/102  Pulse: 83 83  87  Resp: 18 15  13   Temp: 98.4 F (36.9 C)     TempSrc: Oral     SpO2: 98% 97%  98%  Weight:      Height:          Intake/Output Summary (Last 24 hours) at 12/20/2017 1422 Last data filed at 12/20/2017 1300 Gross per 24 hour  Intake 1755 ml  Output 2950 ml  Net -1195 ml   HEENT: PERL, no icterus Chest: clear to auscultation Cor: regular rhythm. Normal s1s2    CBC    Component Value Date/Time   WBC 9.2 12/20/2017 0245   RBC 4.14 12/20/2017 0245   HGB 10.2 (L) 12/20/2017 0245   HCT 33.9 (L) 12/20/2017 0245   PLT 174 12/20/2017 0245   MCV 81.9 12/20/2017 0245   MCH 24.6 (L) 12/20/2017 0245   MCHC 30.1 12/20/2017 0245   RDW 14.8 12/20/2017 0245   LYMPHSABS 1.6 12/18/2017 1416   MONOABS 0.6 12/18/2017 1416   EOSABS 0.0 12/18/2017 1416   BASOSABS 0.0 12/18/2017 1416    BMET    Component Value Date/Time   NA 140 12/20/2017 0907   K 3.4 (L) 12/20/2017 0245    CL 107 12/20/2017 0245   CO2 24 12/20/2017 0245   GLUCOSE 100 (H) 12/20/2017 0245   BUN 7 12/20/2017 0245   CREATININE 0.63 12/20/2017 0245   CALCIUM 8.5 (L) 12/20/2017 0245   GFRNONAA >60 12/20/2017 0245   GFRAA >60 12/20/2017 0245     Impression/Plan: 1. Stroke; management per neurology 2. HTN; po antihypertensive resumed 3. OSA; continue cpap   My cc time 30 minutes  Elayne Snare, MD [facility] Pager: [phone]

## 2017-12-20 NOTE — Progress Notes (Signed)
SLP Cancellation Note  Patient Details Name: Nicole [name] MRN: [id number] DOB: [birth year]   Cancelled treatment:       Reason Eval/Treat Not Completed: Other (comment) Pt passed RN stroke swallow screen, therefore SLP swallow eval not warranted per protocol.   Pt may benefit from cognitive evaluation given potential for cerebellar cognitive affective syndrome.  Please order.  Thank you,   Blenda Mounts Laurice 12/20/2017, 10:01 AM

## 2017-12-20 NOTE — Evaluation (Signed)
Physical Therapy Evaluation Patient Details Name: Nicole [name] MRN: [id number] DOB: February 09, 1966 Today's Date: 12/20/2017   History of Present Illness  52 y.o. female admitted on 12/18/17 for dizziness and HA with resultant N/V/D.  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  Pt with significant PMH of low back pain with sciatica, migraines, HTN, asthma, and depression.  Clinical Impression  Pt limited by pain (HA 10/10), fear and anxiety.  She is fearful of both the stroke and of her dizziness/imbalance.  Strength remains intact in bil LEs, but she has significant dizziness and imbalance while up and moving.  Visual targeting education initiated, but will need to be reinforced.  Pt would be a good inpatient rehab candidate and reports her significant other (husband/fiancee) drives for [employer] and would be able to take off as much time as she needed.   PT to follow acutely for deficits listed below.     Follow Up Recommendations CIR    Equipment Recommendations  None recommended by PT    Recommendations for Other Services Rehab consult     Precautions / Restrictions Precautions Precautions: Fall      Mobility  Bed Mobility Overal bed mobility: Needs Assistance Bed Mobility: Supine to Sit;Sit to Supine     Supine to sit: Supervision;HOB elevated Sit to supine: Supervision   General bed mobility comments: Supervision to get EOB.  HOB mildly elevated to get up.    Transfers Overall transfer level: Needs assistance Equipment used: 2 person hand held assist Transfers: Sit to/from [employer] Sit to Stand: +2 physical assistance;Min assist Stand pivot transfers: +2 physical assistance;Min assist       General transfer comment: Two person min assist for balance and stability.  Pt's eyes closed during the transfer due to HA and dizziness.  Attempted targeting education.  LEs supported by  bed.   Ambulation/Gait Ambulation/Gait assistance: +2 physical assistance;Min assist Ambulation Distance (Feet): 3 Feet Assistive device: 2 person hand held assist Gait Pattern/deviations: Step-to pattern;Leaning posteriorly     General Gait Details: Pt able to take 3-4 steps towards the [facility] with two person min hand held assist for balance, again eyes closed due to increased HA and dizziness.        Modified Rankin (Stroke Patients Only) Modified Rankin (Stroke Patients Only) Pre-Morbid Rankin Score: No symptoms Modified Rankin: Moderately severe disability     Balance Overall balance assessment: Needs assistance Sitting-balance support: Feet supported;Bilateral upper extremity supported;Single extremity supported Sitting balance-Leahy Scale: Fair     Standing balance support: Bilateral upper extremity supported Standing balance-Leahy Scale: Poor Standing balance comment: needs min assist in standing.                             Pertinent Vitals/Pain Pain Assessment: 0-10 Pain Score: 10-Worst pain ever Pain Location: head and behind left eye Pain Descriptors / Indicators: Aching Pain Intervention(s): Limited activity within patient's tolerance;Monitored during session;Repositioned    Home Living Family/patient expects to be discharged to:: Private residence Living Arrangements: Spouse/significant other(significant other is an Biomedical scientist and has flexibility) Available Help at Discharge: Family;Available 24 hours/day Type of Home: Apartment Home Access: Stairs to enter Entrance Stairs-Rails: Right Entrance Stairs-Number of Steps: 5 Home Layout: One level Home Equipment: None(was looking at getting a rollator due to chronic back pain)      Prior Function Level of Independence: Independent  Comments: limited driving, does not work, does household chores, cooking.      Hand Dominance   Dominant Hand: Right    Extremity/Trunk Assessment    Upper Extremity Assessment Upper Extremity Assessment: Defer to OT evaluation    Lower Extremity Assessment Lower Extremity Assessment: Overall WFL for tasks assessed(strength grossly intact, limited functional coordination tes)    Cervical / Trunk Assessment Cervical / Trunk Assessment: Normal  Communication   Communication: No difficulties  Cognition Arousal/Alertness: Awake/alert Behavior During Therapy: Anxious(fearful of pain, fearful of the stroke, and fearful of movin) Overall Cognitive Status: Within Functional Limits for tasks assessed(not specifica)                                               Assessment/Plan    PT Assessment Patient needs continued PT services  PT Problem List Decreased activity tolerance;Decreased balance;Decreased mobility;Decreased knowledge of use of DME;Obesity;Pain       PT Treatment Interventions DME instruction;Gait training;Stair training;Functional mobility training;Therapeutic exercise;Balance training;Therapeutic activities;Neuromuscular re-education;Patient/family education    PT Goals (Current goals can be found in the Care Plan section)  Acute Rehab PT Goals Patient Stated Goal: to decrease her HA pain and get back to normal, see her dog Coco.  PT Goal Formulation: With patient Time For Goal Achievement: 01/03/18 Potential to Achieve Goals: Good    Frequency Min 4X/week        Co-evaluation PT/OT/SLP Co-Evaluation/Treatment: Yes Reason for Co-Treatment: For patient/therapist safety;To address functional/ADL transfers;Complexity of the patient's impairments (multi-system involvement) PT goals addressed during session: Mobility/safety with mobility;Balance         AM-PAC PT "6 Clicks" Daily Activity  Outcome Measure Difficulty turning over in bed (including adjusting bedclothes, sheets and blankets)?: None Difficulty moving from lying on back to sitting on the side of the bed? : None Difficulty sitting  down on and standing up from a chair with arms (e.g., wheelchair, bedside commode, etc,.)?: Unable Help needed moving to and from a bed to chair (including a wheelchair)?: A Little Help needed walking in hospital room?: A Little Help needed climbing 3-5 steps with a railing? : A Lot 6 Click Score: 17    End of Session   Activity Tolerance: Patient limited by pain;Other (comment)(limited by fear and anxiety) Patient left: in bed;with call bell/phone within reach;with bed alarm set Nurse Communication: Mobility status;Patient requests pain meds PT Visit Diagnosis: Unsteadiness on feet (R26.81);Other symptoms and signs involving the nervous system (R29.898);Dizziness and giddiness (R42)    Time: 2956-2130 PT Time Calculation (min) (ACUTE ONLY): 21 min   Charges:        Lurena Joiner B. Tempestt Silba, PT, DPT [phone]   PT Evaluation $PT Eval Moderate Complexity: 1 Mod     12/20/2017, 3:52 PM

## 2017-12-20 NOTE — Progress Notes (Signed)
OT Cancellation Note  Patient Details Name: Nicole [name] MRN: [id number] DOB: October 28, 1966   Cancelled Treatment:    Reason Eval/Treat Not Completed: Patient at procedure or test/ unavailable.  Will reattempt as appropriate.  [name] [place], OTR/L [phone]   [name] M 12/20/2017, 12:02 PM

## 2017-12-20 NOTE — Progress Notes (Signed)
Pt placed on Cpap for night time rest [id number] with 3LPM bled in.. Pt tolerating well RT will follow up

## 2017-12-21 ENCOUNTER — Inpatient Hospital Stay (HOSPITAL_COMMUNITY)

## 2017-12-21 DIAGNOSIS — I69398 Other sequelae of cerebral infarction: Secondary | ICD-10-CM

## 2017-12-21 DIAGNOSIS — R269 Unspecified abnormalities of gait and mobility: Secondary | ICD-10-CM

## 2017-12-21 LAB — BASIC METABOLIC PANEL
Anion gap: 10 (ref 5–15)
BUN: 7 mg/dL (ref 6–20)
CALCIUM: 8.4 mg/dL — AB (ref 8.9–10.3)
CO2: 25 mmol/L (ref 22–32)
CREATININE: 0.57 mg/dL (ref 0.44–1.00)
Chloride: 102 mmol/L (ref 101–111)
GFR calc non Af Amer: >60 mL/min (ref >60)
Glucose, Bld: 108 mg/dL — ABNORMAL HIGH (ref 65–99)
Potassium: 3.1 mmol/L — ABNORMAL LOW (ref 3.5–5.1)
SODIUM: 137 mmol/L (ref 135–145)

## 2017-12-21 LAB — CBC
HCT: 32.9 % — ABNORMAL LOW (ref 36.0–46.0)
Hemoglobin: 9.7 g/dL — ABNORMAL LOW (ref 12.0–15.0)
MCH: 24.1 pg — AB (ref 26.0–34.0)
MCHC: 29.5 g/dL — AB (ref 30.0–36.0)
MCV: 81.8 fL (ref 78.0–100.0)
Platelets: 196 10*3/uL (ref 150–400)
RBC: 4.02 MIL/uL (ref 3.87–5.11)
RDW: 14.6 % (ref 11.5–15.5)
WBC: 10.2 10*3/uL (ref 4.0–10.5)

## 2017-12-21 LAB — SODIUM
SODIUM: 138 mmol/L (ref 135–145)
Sodium: 138 mmol/L (ref 135–145)

## 2017-12-21 MED ORDER — POTASSIUM CHLORIDE CRYS ER 20 MEQ PO TBCR
30.0000 meq | EXTENDED_RELEASE_TABLET | ORAL | Status: AC
  Administered 2017-12-21 (×2): 30 meq via ORAL
  Filled 2017-12-21 (×2): qty 1

## 2017-12-21 MED ORDER — SODIUM CHLORIDE 3 % IV SOLN
INTRAVENOUS | Status: AC
  Filled 2017-12-21: qty 500

## 2017-12-21 MED ORDER — SODIUM CHLORIDE 3 % IV SOLN
INTRAVENOUS | Status: AC
  Administered 2017-12-21: 37.5 mL/h via INTRAVENOUS
  Filled 2017-12-21: qty 500

## 2017-12-21 NOTE — Progress Notes (Signed)
[facility] ADULT ICU REPLACEMENT PROTOCOL FOR AM LAB REPLACEMENT ONLY  The patient does apply for the [facility] Adult ICU Electrolyte Replacment Protocol based on the criteria listed below:   1. Is GFR >/= 40 ml/min? Yes.    Patient's GFR today is >60 2. Is urine output >/= 0.5 ml/kg/hr for the last 6 hours? Yes.   Patient's UOP is 0.5 ml/kg/hr 3. Is BUN < 60 mg/dL? Yes.    Patient's BUN today is 7 4. Abnormal electrolyte  K 3.1 5. Ordered repletion with: per protocol 6. If a panic level lab has been reported, has the CCM MD in charge been notified? Yes.  .   Physician:  Tonny Branch, Lang Snow 12/21/2017 6:27 AM

## 2017-12-21 NOTE — Progress Notes (Signed)
Spoke with Dr Pearlean Brownie re 3% gtt. It has been infusing in PIV since 1400 yesterday. Dr Pearlean Brownie ordered me to rotate IV sites, and begin to taper 3% off by decreasing gtt by 50% now and another 50% in 12 hours.

## 2017-12-21 NOTE — Progress Notes (Signed)
Inpatient Rehabilitation  Consult complete by Dr. Wynn Banker; however, following I received call from acute PT to clarify that while patient was not using an assistive device she required +2 hands on assist for safety with short distances of gait.  Patient continues to require IP Rehab and insurance has been initiated.  Plan to follow up with patient tomorrow.  Call if questions.   Charlane Ferretti., CCC/SLP Admission Coordinator  [facility] Inpatient Rehabilitation  Cell [phone]

## 2017-12-21 NOTE — Progress Notes (Signed)
.. ..  Name: Nicole [name] MRN: [id number] DOB: 10/07/1966    ADMISSION DATE:  12/18/2017 CONSULTATION DATE:  12/19/17  REFERRING MD :  Otelia Limes MD NEUROLOGY  CHIEF COMPLAINT:  PERSISTENT DIZZINESS, FALL, NAUSEA  BRIEF PATIENT DESCRIPTION: 52 yr old female with hx of Migraines presented 2/3 with N/V/D and dizziness initially symptoms improved post fluids and migraine cocktail but pt began to have worsening dizziness and headaches. 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 CTA confirms occlusion of left PICA and severe stenosis of Right. Neurology and Neurosurgery ware and managing patient. PCCM consulted for hyperosmolar therapy and frequent Neurochecks due to high risk of decompensation.  Pt improved with 3%NS.  Neurology following.    SUBJECTIVE:   Pt reports feeling improved overall.  Slight residual headache and dizziness when she looks to the left.  K replaced per Pola Corn.   VITAL SIGNS: Temp:  [97.7 F (36.5 C)-98.5 F (36.9 C)] 97.7 F (36.5 C) (02/06 0800) Pulse Rate:  [72-88] 73 (02/06 0800) Resp:  [13-25] 19 (02/06 0800) BP: (103-162)/(61-116) 127/76 (02/06 0800) SpO2:  [93 %-99 %] 97 % (02/06 0800) Weight:  [300 lb 11.3 oz (136.4 kg)] 300 lb 11.3 oz (136.4 kg) (02/06 0500)  PHYSICAL EXAMINATION: General: obese female in NAD, sitting up in chair  HEENT: MM pink/moist, no jvd PSY: calm/appropriate  Neuro: AAOx4, speech clear, MAE CV: s1s2 rrr, no m/r/g PULM: even/non-labored, lungs bilaterally clear  HE:KBTC, non-tender, bsx4 active  Extremities: warm/dry, no edema  Skin: no rashes or lesions   Recent Labs  Lab 12/19/17 1115  12/20/17 0245  12/20/17 1453 12/20/17 2142 12/21/17 0354  NA 145   < > 142  142   < > 145 138 137  K 3.4*  --  3.4*  --   --   --  3.1*  CL 112*  --  107  --   --   --  102  CO2 23  --  24  --   --   --  25  BUN 11  --  7  --   --   --  7  CREATININE 0.62  --  0.63  --   --   --  0.57  GLUCOSE 100*   --  100*  --   --   --  108*   < > = values in this interval not displayed.   Recent Labs  Lab 12/18/17 1416 12/20/17 0245 12/21/17 0354  HGB 12.4 10.2* 9.7*  HCT 39.5 33.9* 32.9*  WBC 12.7* 9.2 10.2  PLT 212 174 196   Ct Head Wo Contrast  Result Date: 12/21/2017 CLINICAL DATA:  Stroke follow-up. EXAM: CT HEAD WITHOUT CONTRAST TECHNIQUE: Contiguous axial images were obtained from the base of the skull through the vertex without intravenous contrast. COMPARISON:  Brain MRI and CT 12/19/2017 FINDINGS: Brain: A large left PICA infarct and additional smaller bilateral cerebellar infarcts are unchanged from the prior studies. Associated edema and posterior fossa mass effect resulting in partial effacement of the fourth ventricle and quadrilateral plate cistern and mass effect on the brainstem are unchanged. Inferior tonsillar herniation and mild dilatation of the lateral ventricles are unchanged. There is no evidence of acute supratentorial infarct. No extra-axial fluid collection or acute hemorrhage is identified. Vascular: Calcified atherosclerosis at the skull base, most notably involving the left vertebral artery. Skull: No fracture or focal osseous lesion. Sinuses/Orbits: Large left maxillary sinus mucous retention cyst with mild mucosal thickening  scattered throughout the paranasal sinuses bilaterally. Clear mastoid air cells. Unremarkable orbits. Other: None. IMPRESSION: Unchanged edema and mass effect associated with acute left larger than right cerebellar infarcts. Unchanged inferior tonsillar herniation and mild obstructive hydrocephalus. No acute hemorrhage. Electronically Signed   By: Sebastian Ache M.D.   On: 12/21/2017 07:25   Mr Maxine Glenn Head Wo Contrast  Addendum Date: 12/19/2017   ADDENDUM REPORT: 12/19/2017 16:00 ADDENDUM: 20 cc MultiHance administered. Electronically Signed   By: Mitzi Hansen M.D.   On: 12/19/2017 16:00   Result Date: 12/19/2017 CLINICAL DATA:  52 y/o  F; acute  left PICA distribution infarction. EXAM: MRI HEAD WITHOUT AND WITH CONTRAST MRA HEAD WITHOUT CONTRAST MRA NECK WITHOUT AND WITH CONTRAST TECHNIQUE: Multiplanar, multiecho pulse sequences of the brain and surrounding structures were obtained without and with intravenous contrast. Angiographic images of the Circle of Willis were obtained using MRA technique without intravenous contrast. Angiographic images of the neck were obtained using MRA technique without and with intravenous contrast. Carotid stenosis measurements (when applicable) are obtained utilizing NASCET criteria, using the distal internal carotid diameter as the denominator. COMPARISON:  None. FINDINGS: MRI HEAD FINDINGS Severe motion degradation of multiple sequences. Brain: Reduced diffusion within the left inferior cerebellar hemisphere in PICA distribution compatible with acute/early subacute infarct additional very small areas of acute/early subacute infarction within the left superior cerebellar hemisphere, vermis, and right superior cerebellar hemisphere. Infarcts demonstrate T2 FLAIR hyperintense signal abnormality and mild local mass effect with partial effacement of the fourth ventricle, left quadrigeminal plate cistern, and downward cerebellar tonsillar descent. Motion degraded SWI sequence, no gross hemorrhage. Findings are stable in comparison with prior CT of the head given differences in technique. Stable mild enlargement of the lateral ventricles. No extra-axial collection no additional signal abnormality elsewhere in the brain. No abnormal enhancement of the brain. Vascular: As below. Skull and upper cervical spine: Normal marrow signal. Sinuses/Orbits: Patchy opacification of ethmoid sinuses, large left maxillary sinus mucous retention cyst, and diffuse paranasal sinus mucosal thickening. No abnormal signal of mastoid air cells. Orbits are unremarkable. Other: Negative. MRA HEAD FINDINGS Severe motion degradation. Internal carotid  arteries:  Patent. Anterior cerebral arteries:  Patent. Middle cerebral arteries: Patent. Left MCA bifurcation fusiform aneurysm is poorly visualized on the current study due to motion artifact. Anterior communicating artery: Right A1/A-comm junction prominent infundibulum versus 3 x 3 mm aneurysm (series 4, image 75). Posterior communicating arteries:  Patent.  Bilateral fetal PCA. Posterior cerebral arteries:  Patent. Basilar artery:  Diffuse contour irregularity. Vertebral arteries: The right vertebral artery terminates in the right PICA and the downstream right vertebral artery is persistently occluded. Unchanged irregularity of the left V4 segment probably corresponding to thrombus as seen on CTA. No appreciable left PICA. No additional large vessel occlusion or aneurysm identified. MRA NECK FINDINGS Severe motion degradation. Aortic arch: Patent. Right common carotid artery: Patent. Right internal carotid artery: Patent. There may be a mild degree of stenosis of the proximal right internal carotid artery. Right vertebral artery: Patent. Left common carotid artery: Patent. Left Internal carotid artery: Patent. Left Vertebral artery: Patent. There is no definite evidence of hemodynamically significant stenosis by NASCET criteria, occlusion, or aneurysm unless noted above. IMPRESSION: MRI head: 1. Severe motion artifact. 2. Left PICA distribution acute/early subacute infarction and multiple additional very small infarcts in the superior cerebellar hemispheres bilaterally with edema and local mass effect resulting in effacement of fourth ventricle, downward descent of cerebellar tonsils, and partial effacement of quadrigeminal plate cistern.  No gross hemorrhage. Findings are stable from prior CT of head given differences in technique. 3. Mild to moderate paranasal sinus disease. MRA head: 1. Severe motion artifact. 2. Anterior communicating artery 3 mm prominent infundibulum versus aneurysm better characterized on  prior CTA of head. 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 3. 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 4. Mid left V4 signal irregularity probably corresponding to thrombus prior CTA. 5. No new large vessel occlusion or aneurysm identified. MRA neck: 1. Severe motion artifact. 2. Patent carotid and vertebral arteries of the neck. 3. Possible mild right proximal ICA stenosis, suboptimal assessment due to extensive motion artifact. Consider carotid Doppler or repeat CTA/MRA when patient is able to hold still. Electronically Signed: By: Mitzi Hansen M.D. On: 12/19/2017 15:07   Mr Maxine Glenn Neck W Wo Contrast  Addendum Date: 12/19/2017   ADDENDUM REPORT: 12/19/2017 16:00 ADDENDUM: 20 cc MultiHance administered. Electronically Signed   By: Mitzi Hansen M.D.   On: 12/19/2017 16:00   Result Date: 12/19/2017 CLINICAL DATA:  52 y/o  F; acute left PICA distribution infarction. EXAM: MRI HEAD WITHOUT AND WITH CONTRAST MRA HEAD WITHOUT CONTRAST MRA NECK WITHOUT AND WITH CONTRAST TECHNIQUE: Multiplanar, multiecho pulse sequences of the brain and surrounding structures were obtained without and with intravenous contrast. Angiographic images of the Circle of Willis were obtained using MRA technique without intravenous contrast. Angiographic images of the neck were obtained using MRA technique without and with intravenous contrast. Carotid stenosis measurements (when applicable) are obtained utilizing NASCET criteria, using the distal internal carotid diameter as the denominator. COMPARISON:  None. FINDINGS: MRI HEAD FINDINGS Severe motion degradation of multiple sequences. Brain: Reduced diffusion within the left inferior cerebellar hemisphere in PICA distribution compatible with acute/early subacute infarct additional very small areas of acute/early subacute infarction within the left superior cerebellar hemisphere, vermis, and right superior  cerebellar hemisphere. Infarcts demonstrate T2 FLAIR hyperintense signal abnormality and mild local mass effect with partial effacement of the fourth ventricle, left quadrigeminal plate cistern, and downward cerebellar tonsillar descent. Motion degraded SWI sequence, no gross hemorrhage. Findings are stable in comparison with prior CT of the head given differences in technique. Stable mild enlargement of the lateral ventricles. No extra-axial collection no additional signal abnormality elsewhere in the brain. No abnormal enhancement of the brain. Vascular: As below. Skull and upper cervical spine: Normal marrow signal. Sinuses/Orbits: Patchy opacification of ethmoid sinuses, large left maxillary sinus mucous retention cyst, and diffuse paranasal sinus mucosal thickening. No abnormal signal of mastoid air cells. Orbits are unremarkable. Other: Negative. MRA HEAD FINDINGS Severe motion degradation. Internal carotid arteries:  Patent. Anterior cerebral arteries:  Patent. Middle cerebral arteries: Patent. Left MCA bifurcation fusiform aneurysm is poorly visualized on the current study due to motion artifact. Anterior communicating artery: Right A1/A-comm junction prominent infundibulum versus 3 x 3 mm aneurysm (series 4, image 75). Posterior communicating arteries:  Patent.  Bilateral fetal PCA. Posterior cerebral arteries:  Patent. Basilar artery:  Diffuse contour irregularity. Vertebral arteries: The right vertebral artery terminates in the right PICA and the downstream right vertebral artery is persistently occluded. Unchanged irregularity of the left V4 segment probably corresponding to thrombus as seen on CTA. No appreciable left PICA. No additional large vessel occlusion or aneurysm identified. MRA NECK FINDINGS Severe motion degradation. Aortic arch: Patent. Right common carotid artery: Patent. Right internal carotid artery: Patent. There may be a mild degree of stenosis of the proximal right internal  carotid  artery. Right vertebral artery: Patent. Left common carotid artery: Patent. Left Internal carotid artery: Patent. Left Vertebral artery: Patent. There is no definite evidence of hemodynamically significant stenosis by NASCET criteria, occlusion, or aneurysm unless noted above. IMPRESSION: MRI head: 1. Severe motion artifact. 2. Left PICA distribution acute/early subacute infarction and multiple additional very small infarcts in the superior cerebellar hemispheres bilaterally with edema and local mass effect resulting in effacement of fourth ventricle, downward descent of cerebellar tonsils, and partial effacement of quadrigeminal plate cistern. No gross hemorrhage. Findings are stable from prior CT of head given differences in technique. 3. Mild to moderate paranasal sinus disease. MRA head: 1. Severe motion artifact. 2. Anterior communicating artery 3 mm prominent infundibulum versus aneurysm better characterized on prior CTA of head. 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 3. 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 4. Mid left V4 signal irregularity probably corresponding to thrombus prior CTA. 5. No new large vessel occlusion or aneurysm identified. MRA neck: 1. Severe motion artifact. 2. Patent carotid and vertebral arteries of the neck. 3. Possible mild right proximal ICA stenosis, suboptimal assessment due to extensive motion artifact. Consider carotid Doppler or repeat CTA/MRA when patient is able to hold still. Electronically Signed: By: Mitzi Hansen M.D. On: 12/19/2017 15:07   Mr Laqueta Jean XB Contrast  Addendum Date: 12/19/2017   ADDENDUM REPORT: 12/19/2017 16:00 ADDENDUM: 20 cc MultiHance administered. Electronically Signed   By: Mitzi Hansen M.D.   On: 12/19/2017 16:00   Result Date: 12/19/2017 CLINICAL DATA:  52 y/o  F; acute left PICA distribution infarction. EXAM: MRI HEAD WITHOUT AND WITH CONTRAST MRA HEAD  WITHOUT CONTRAST MRA NECK WITHOUT AND WITH CONTRAST TECHNIQUE: Multiplanar, multiecho pulse sequences of the brain and surrounding structures were obtained without and with intravenous contrast. Angiographic images of the Circle of Willis were obtained using MRA technique without intravenous contrast. Angiographic images of the neck were obtained using MRA technique without and with intravenous contrast. Carotid stenosis measurements (when applicable) are obtained utilizing NASCET criteria, using the distal internal carotid diameter as the denominator. COMPARISON:  None. FINDINGS: MRI HEAD FINDINGS Severe motion degradation of multiple sequences. Brain: Reduced diffusion within the left inferior cerebellar hemisphere in PICA distribution compatible with acute/early subacute infarct additional very small areas of acute/early subacute infarction within the left superior cerebellar hemisphere, vermis, and right superior cerebellar hemisphere. Infarcts demonstrate T2 FLAIR hyperintense signal abnormality and mild local mass effect with partial effacement of the fourth ventricle, left quadrigeminal plate cistern, and downward cerebellar tonsillar descent. Motion degraded SWI sequence, no gross hemorrhage. Findings are stable in comparison with prior CT of the head given differences in technique. Stable mild enlargement of the lateral ventricles. No extra-axial collection no additional signal abnormality elsewhere in the brain. No abnormal enhancement of the brain. Vascular: As below. Skull and upper cervical spine: Normal marrow signal. Sinuses/Orbits: Patchy opacification of ethmoid sinuses, large left maxillary sinus mucous retention cyst, and diffuse paranasal sinus mucosal thickening. No abnormal signal of mastoid air cells. Orbits are unremarkable. Other: Negative. MRA HEAD FINDINGS Severe motion degradation. Internal carotid arteries:  Patent. Anterior cerebral arteries:  Patent. Middle cerebral arteries: Patent.  Left MCA bifurcation fusiform aneurysm is poorly visualized on the current study due to motion artifact. Anterior communicating artery: Right A1/A-comm junction prominent infundibulum versus 3 x 3 mm aneurysm (series 4, image 75). Posterior communicating arteries:  Patent.  Bilateral fetal PCA. Posterior cerebral  arteries:  Patent. Basilar artery:  Diffuse contour irregularity. Vertebral arteries: The right vertebral artery terminates in the right PICA and the downstream right vertebral artery is persistently occluded. Unchanged irregularity of the left V4 segment probably corresponding to thrombus as seen on CTA. No appreciable left PICA. No additional large vessel occlusion or aneurysm identified. MRA NECK FINDINGS Severe motion degradation. Aortic arch: Patent. Right common carotid artery: Patent. Right internal carotid artery: Patent. There may be a mild degree of stenosis of the proximal right internal carotid artery. Right vertebral artery: Patent. Left common carotid artery: Patent. Left Internal carotid artery: Patent. Left Vertebral artery: Patent. There is no definite evidence of hemodynamically significant stenosis by NASCET criteria, occlusion, or aneurysm unless noted above. IMPRESSION: MRI head: 1. Severe motion artifact. 2. Left PICA distribution acute/early subacute infarction and multiple additional very small infarcts in the superior cerebellar hemispheres bilaterally with edema and local mass effect resulting in effacement of fourth ventricle, downward descent of cerebellar tonsils, and partial effacement of quadrigeminal plate cistern. No gross hemorrhage. Findings are stable from prior CT of head given differences in technique. 3. Mild to moderate paranasal sinus disease. MRA head: 1. Severe motion artifact. 2. Anterior communicating artery 3 mm prominent infundibulum versus aneurysm better characterized on prior CTA of head. Left MCA bifurcation aneurysm poorly visualized due to motion  artifact. 3. 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 4. Mid left V4 signal irregularity probably corresponding to thrombus prior CTA. 5. No new large vessel occlusion or aneurysm identified. MRA neck: 1. Severe motion artifact. 2. Patent carotid and vertebral arteries of the neck. 3. Possible mild right proximal ICA stenosis, suboptimal assessment due to extensive motion artifact. Consider carotid Doppler or repeat CTA/MRA when patient is able to hold still. Electronically Signed: By: Mitzi Hansen M.D. On: 12/19/2017 15:07   SIGNIFICANT EVENTS  2/03  Admit with dizziness, CTH with acute cerebellar infarct with mass effect and initial herniation  STUDIES:  CT Head 2/4 >> Acute large LEFT posterior-inferior cerebellar artery territory nonhemorrhagic infarct. Additional acute small bilateral cerebellar infarcts. Cerebellar edema resulting in inferior tonsillar herniation and early obstructive hydrocephalus.  CTA HEAD 2/4 >> Occluded LEFT PICA. Severe stenosis RIGHT PICA, secondary to dissection, less likely vaso spasm or atherosclerosis. LEFT V4 thromboembolism resulting in moderate stenosis, dissection not excluded. 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 Pearlean Brownie / Stroke Service  Continue ASA (not an allergy, see note from 2/5) No anticoagulation due to high risk hemorrhagic conversion  Defer 3% NS to Neurology  CIR consult in progress   CARDIAC A: H/o  HTN - not a candidate for permissive HTN P:  ICU monitoring for now, likely can transfer out once off 3% Continue lipitor, cardizem, lasix, HCTZ, toprolol PRN labetalol   PULMONARY A: Hx OSA - on CPAP  At Risk Aspiration  P: Pulmonary hygiene  Continue QHS CPAP  O2 as needed to support sats > 92% HOB 30-45 degrees   ENDOCRINE A: At Risk Hypo/Hyperglyemia - Hgb A1c 6.3 on  admit  P: Monitor glucose on BMP   GI A: Severe nausea - vertigo secondary to increased ICP/ Tonsilar herniation/cerebellar infarct, improved  P: PRN zofran  Aspiration precautions    HEME A: Trend CBC  SCD's for DVT prophylaxis  ASA ok per Neuro   RENAL A: NGMA - resolved  P: Trend BMP / urinary output Replace electrolytes as indicated Avoid nephrotoxic agents, ensure adequate renal perfusion  PCCM will transition medical care to [facility] as of 2/7 am.    Canary Brim, NP-C [facility] Pgr: [phone] or if no answer [phone] 12/21/2017, 10:25 AM  Attending:  I have seen and examined the patient with nurse practitioner/resident and agree with the note above.  We formulated the plan together and I elicited the following history.    Subjective: More alert today. Continues to complain of a headache with movement.  Objective: Vitals:   12/21/17 1130 12/21/17 1156 12/21/17 1200 12/21/17 1549  BP: (!) 137/93  (!) 146/85   Pulse: 73  77   Resp: 20  18   Temp:  98.3 F (36.8 C)  97.7 F (36.5 C)  TempSrc:  Oral  Oral  SpO2: 97%  (!) 86%   Weight:      Height:          Intake/Output Summary (Last 24 hours) at 12/21/2017 1554 Last data filed at 12/21/2017 0700 Gross per 24 hour  Intake 2400 ml  Output 2150 ml  Net 250 ml   General: obese female in NAD, sitting up in chair  HEENT: No JVD.  PSY: calm/appropriate  Neuro: AAOx4, speech clear, MAE CV: s1s2 rrr, no m/r/g PULM: even/non-labored, lungs bilaterally clear  UJ:WJXB, non-tender, bsx4 active  Extremities:  warm/dry, no edema  Skin: no rashes or lesions    CBC    Component Value Date/Time   WBC 10.2 12/21/2017 0354   RBC 4.02 12/21/2017 0354   HGB 9.7 (L) 12/21/2017 0354   HCT 32.9 (L) 12/21/2017 0354   PLT 196 12/21/2017 0354   MCV 81.8 12/21/2017 0354   MCH 24.1 (L) 12/21/2017 0354   MCHC 29.5 (L) 12/21/2017 0354   RDW 14.6 12/21/2017 0354   LYMPHSABS 1.6 12/18/2017 1416   MONOABS 0.6 12/18/2017 1416   EOSABS 0.0 12/18/2017 1416   BASOSABS 0.0 12/18/2017 1416    BMET    Component Value Date/Time   NA 138 12/21/2017 1443   K 3.1 (L) 12/21/2017 0354   CL 102 12/21/2017 0354   CO2 25 12/21/2017 0354   GLUCOSE 108 (H) 12/21/2017 0354   BUN 7 12/21/2017 0354   CREATININE 0.57 12/21/2017 0354   CALCIUM 8.4 (L) 12/21/2017 0354   GFRNONAA >60 12/21/2017 0354   GFRAA >60 12/21/2017 0354      Impression/Plan: 1. Cerebellar stroke. Awaiting rehab evaluation. On asa. 2. HTN. Continue anti hypertensive therapy. 3. OSA. Continue CPAP.  My cc time 30 minutes  Elayne Snare, MD  PCCM Pager: [phone]

## 2017-12-21 NOTE — Progress Notes (Signed)
Physical Therapy Treatment Patient Details Name: Nicole [name] MRN: [id number] DOB: [birth year] Today's Date: 12/21/2017    History of Present Illness 52 y.o. female admitted on 12/18/17 for dizziness and HA with resultant N/V/D.  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  Pt with significant PMH of low back pain with sciatica, migraines, HTN, asthma, and depression.    PT Comments    Patient seen for activity progression and gait retraining. Patient extremely motivated and tolerated well. Continues to require +2 hands on assist for stability. Session focused primarily on gait retraining. Patient performed multiple trials of 40 feet ambulation with targeting practice for gaze stabilization as well as 3 trials of eyes open/ eyes closed cycles with ambulation to induce acclamation and proprioceptive awareness. During this task increased physical assist required (at times +2 moderate assist) but patient did report improvements in symptoms (spinning/dizziness) with activity. Continue to feel patient is an ideal candidate for comprehensive therapies. Will continue to see and progress as tolerated.    Follow Up Recommendations  CIR     Equipment Recommendations  None recommended by PT    Recommendations for Other Services Rehab consult     Precautions / Restrictions Precautions Precautions: Fall    Mobility  Bed Mobility               General bed mobility comments: received in chair  Transfers Overall transfer level: Needs assistance Equipment used: 2 person hand held assist Transfers: Sit to/from Stand Sit to Stand: +2 physical assistance;Min assist         General transfer comment: 2 person HHA for stability and safety upon elevation to upright. Noted left sway. VCs for hand placement and targeting  Ambulation/Gait Ambulation/Gait assistance: Min assist;Mod assist;+2 physical assistance    Assistive device: 2 person hand held assist Gait Pattern/deviations: Step-to pattern;Leaning posteriorly;Drifts right/left Gait velocity: non functional speed at this time Gait velocity interpretation: <1.8 ft/sec, indicative of risk for recurrent falls General Gait Details: patient requires 2 person min assist with one episode of moderate assist upon fatigue for LOB to the left. Patient performed ambulation in 40 ft trials with cues for targeting. decreased   Stairs            Wheelchair Mobility    Modified Rankin (Stroke Patients Only) Modified Rankin (Stroke Patients Only) Pre-Morbid Rankin Score: No symptoms Modified Rankin: Moderately severe disability     Balance Overall balance assessment: Needs assistance Sitting-balance support: Feet supported;Bilateral upper extremity supported;Single extremity supported Sitting balance-Leahy Scale: Fair     Standing balance support: Bilateral upper extremity supported Standing balance-Leahy Scale: Poor Standing balance comment: needs min assist in standing.                            Cognition Arousal/Alertness: Awake/alert Behavior During Therapy: Anxious(fearful of pain, fearful of the stroke, and fearful of movin) Overall Cognitive Status: Within Functional Limits for tasks assessed                                        Exercises      General Comments        Pertinent Vitals/Pain Pain Assessment: 0-10 Pain Score: 6  Pain Location: behind left eye and headache Pain Descriptors / Indicators: Aching Pain Intervention(s): Monitored during  session    Home Living                      Prior Function            PT Goals (current goals can now be found in the care plan section) Acute Rehab PT Goals Patient Stated Goal: to decrease her HA pain and get back to normal, see her dog Coco.  PT Goal Formulation: With patient Time For Goal Achievement: 01/03/18 Potential to Achieve  Goals: Good Progress towards PT goals: Progressing toward goals    Frequency    Min 4X/week      PT Plan Current plan remains appropriate    Co-evaluation              AM-PAC PT "6 Clicks" Daily Activity  Outcome Measure  Difficulty turning over in bed (including adjusting bedclothes, sheets and blankets)?: None Difficulty moving from lying on back to sitting on the side of the bed? : None Difficulty sitting down on and standing up from a chair with arms (e.g., wheelchair, bedside commode, etc,.)?: Unable Help needed moving to and from a bed to chair (including a wheelchair)?: A Little Help needed walking in hospital room?: A Little Help needed climbing 3-5 steps with a railing? : A Lot 6 Click Score: 17    End of Session   Activity Tolerance: Patient limited by pain;Other (comment)(limited by fear and anxiety) Patient left: in chair;with call bell/phone within reach Nurse Communication: Mobility status;Patient requests pain meds PT Visit Diagnosis: Unsteadiness on feet (R26.81);Other symptoms and signs involving the nervous system (R29.898);Dizziness and giddiness (R42)     Time: 4174-0814 PT Time Calculation (min) (ACUTE ONLY): 25 min  Charges:  $Gait Training: 8-22 mins $Therapeutic Activity: 8-22 mins                    G Codes:       Charlotte Crumb, PT DPT  Board Certified Neurologic Specialist [phone]    Fabio Asa 12/21/2017, 1:49 PM

## 2017-12-21 NOTE — Consult Note (Signed)
Physical Medicine and Rehabilitation Consult Reason for Consult: Decreased functional mobility Referring Physician: [facility]   HPI: Nicole [name] is a 52 y.o. right handed female with history of migraine headaches, obesity, hyperlipidemia, hypertension and chronic low back pain. Per chart review patient lives with significant other. Independent prior to admission. One level home with 5 steps to entry. Significant other is an Biomedical scientist and has flexibility and work schedule. Presented 12/19/2017 with persistent dizziness, fall as well as nausea vomiting. CT of the head showed acute infarcts in the left more than right cerebellum with posterior fossa mass effect and foramen magnum stenosis. CTA of head with no large vessel occlusion or stenosis. Patient did not receive TPA. 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 No gross hemorrhage. Neurosurgery Dr. Marikay Alar consulted for review of CTA no surgical intervention at this time. Patient was placed on 3% saline. MRA of the head with anterior communicating artery 3 mm prominent infundibulum versus aneurysm. Stable right distal V4 occlusion. No new large vessel occlusion. Echocardiogram with ejection fraction of 65% grade 1 diastolic dysfunction. Neurology consulted presently on aspirin for CVA prophylaxis. Tolerating a regular diet. Bouts of hypokalemia with supplement added. Physical and occupational therapy evaluations completed with recommendations of physical medicine rehabilitation consult.   Review of Systems  Constitutional: Negative for chills and fever.  HENT: Negative for hearing loss.   Eyes: Positive for blurred vision.  Respiratory: Positive for shortness of breath. Negative for cough.   Cardiovascular: Negative for chest pain, palpitations and leg  swelling.  Gastrointestinal: Positive for nausea and vomiting.  Genitourinary: Negative for dysuria, flank pain and hematuria.  Musculoskeletal: Positive for back pain.  Skin: Negative for rash.  Neurological: Positive for dizziness and headaches. Negative for seizures.  All other systems reviewed and are negative.  Past Medical History:  Diagnosis Date  . Asthma   . Depression   . Hyperlipemia   . Hypertension   . Migraines   . Obesity   . Sciatica    History reviewed. No pertinent surgical history. No family history on file. Social History:  reports that  has never smoked. she has never used smokeless tobacco. She reports that she does not drink alcohol or use drugs. Allergies:  Allergies  Allergen Reactions  . Asa [Aspirin] Other (See Comments)    Contraindication per provider - pt states not allergic, PCP stated did not go with treatment regimen, ok for pt to take medication  . Ibuprofen Nausea Only  . Morphine And Related Palpitations   Medications Prior to Admission  Medication Sig Dispense Refill  . atorvastatin (LIPITOR) 40 MG tablet Take 40 mg by mouth daily.    Marland Kitchen diltiazem (CARDIZEM CD) 240 MG 24 hr capsule Take 240 mg by mouth 2 (two) times daily.     Marland Kitchen FLUoxetine (PROZAC) 20 MG tablet Take 40 mg by mouth 2 (two) times daily.     . furosemide (LASIX) 20 MG tablet Take 20 mg by mouth 2 (two) times daily.    . hydrochlorothiazide (HYDRODIURIL) 25 MG tablet Take 25 mg by mouth daily.    Marland Kitchen lidocaine (LIDODERM) 5 % Place 1 patch onto the skin daily. Remove & Discard patch within 12 hours or as directed by MD 30 patch 0  . metoprolol succinate (TOPROL-XL) 25 MG 24 hr tablet Take 25 mg  by mouth daily.  5  . omeprazole (PRILOSEC) 20 MG capsule Take 20 mg by mouth at bedtime.    Marland Kitchen zolpidem (AMBIEN) 5 MG tablet Take 5 mg by mouth at bedtime as needed for sleep.      Home: Home Living Family/patient expects to be discharged to:: Private residence Living Arrangements:  Spouse/significant other(significant other is an Biomedical scientist and has flexibility) Available Help at Discharge: Family, Available 24 hours/day Type of Home: Apartment Home Access: Stairs to enter Secretary/administrator of Steps: 5 Entrance Stairs-Rails: Right Home Layout: One level Bathroom Shower/Tub: Tub/shower unit, Engineer, building services: Standard Home Equipment: None(was looking at getting a rollator due to chronic back pain)  Functional History: Prior Function Level of Independence: Independent Comments: Pt reports she has back pain that limits her at times, and she was considering the purchase of a rollator.  Limited driving, does not work, does household chores, cooking.  Functional Status:  Mobility: Bed Mobility Overal bed mobility: Needs Assistance Bed Mobility: Supine to Sit, Sit to Supine Supine to sit: Supervision, HOB elevated Sit to supine: Supervision General bed mobility comments: Supervision to get EOB.  HOB mildly elevated to get up.   Transfers Overall transfer level: Needs assistance Equipment used: 2 person hand held assist Transfers: Sit to/from Stand, Stand Pivot Transfers Sit to Stand: +2 physical assistance, Min assist Stand pivot transfers: +2 physical assistance, Min assist General transfer comment: Two person min assist for balance and stability.  Pt's eyes closed during the transfer due to HA and dizziness.  Attempted targeting education.  LEs supported by bed.  Ambulation/Gait Ambulation/Gait assistance: +2 physical assistance, Min assist Ambulation Distance (Feet): 3 Feet Assistive device: 2 person hand held assist Gait Pattern/deviations: Step-to pattern, Leaning posteriorly General Gait Details: Pt able to take 3-4 steps towards the [facility] with two person min hand held assist for balance, again eyes closed due to increased HA and dizziness.      ADL: ADL Overall ADL's : Needs assistance/impaired Eating/Feeding: Modified independent, Bed  level Grooming: Wash/dry hands, Wash/dry face, Oral care, Brushing hair, Minimal assistance, Sitting Upper Body Bathing: Moderate assistance, Sitting Lower Body Bathing: Maximal assistance, Sit to/from stand Upper Body Dressing : Maximal assistance, Sitting Lower Body Dressing: Total assistance, Sit to/from stand Toilet Transfer: Minimal assistance, +2 for physical assistance, Stand-pivot, BSC Toileting- Clothing Manipulation and Hygiene: Total assistance, Sit to/from stand Functional mobility during ADLs: Minimal assistance, +2 for physical assistance General ADL Comments: limited by severe HA and dizziness   Cognition: Cognition Overall Cognitive Status: Within Functional Limits for tasks assessed(not specifica) Orientation Level: Oriented X4 Cognition Arousal/Alertness: Awake/alert Behavior During Therapy: Anxious(fearful of pain, fearful of the stroke, and fearful of movin) Overall Cognitive Status: Within Functional Limits for tasks assessed(not specifica)  Blood pressure (!) 137/93, pulse 73, temperature 98.3 F (36.8 C), temperature source Oral, resp. rate 20, height 5\' 3"  (1.6 m), weight (!) 136.4 kg (300 lb 11.3 oz), last menstrual period 12/11/2017, SpO2 97 %. Physical Exam  Vitals reviewed. Constitutional: She is oriented to person, place, and time.  52 year old right-handed obese female  HENT:  Head: Normocephalic.  Eyes: EOM are normal.  Neck: Normal range of motion. Neck supple. No thyromegaly present.  Cardiovascular: Normal rate, regular rhythm and normal heart sounds.  Respiratory: Effort normal and breath sounds normal. No respiratory distress.  GI: Soft. Bowel sounds are normal. She exhibits no distension.  Neurological: She is alert and oriented to person, place, and time.  Patient is tearful. Follows  full commands. Fair awareness of deficits  Skin: Skin is warm and dry.    Results for orders placed or performed during the hospital encounter of 12/18/17 (from  the past 24 hour(s))  Sodium     Status: None   Collection Time: 12/20/17  2:53 PM  Result Value Ref Range   Sodium 145 135 - 145 mmol/L  Sodium     Status: None   Collection Time: 12/20/17  9:42 PM  Result Value Ref Range   Sodium 138 135 - 145 mmol/L  CBC     Status: Abnormal   Collection Time: 12/21/17  3:54 AM  Result Value Ref Range   WBC 10.2 4.0 - 10.5 K/uL   RBC 4.02 3.87 - 5.11 MIL/uL   Hemoglobin 9.7 (L) 12.0 - 15.0 g/dL   HCT 82.9 (L) 93.7 - 16.9 %   MCV 81.8 78.0 - 100.0 fL   MCH 24.1 (L) 26.0 - 34.0 pg   MCHC 29.5 (L) 30.0 - 36.0 g/dL   RDW 67.8 93.8 - 10.1 %   Platelets 196 150 - 400 K/uL  Basic metabolic panel     Status: Abnormal   Collection Time: 12/21/17  3:54 AM  Result Value Ref Range   Sodium 137 135 - 145 mmol/L   Potassium 3.1 (L) 3.5 - 5.1 mmol/L   Chloride 102 101 - 111 mmol/L   CO2 25 22 - 32 mmol/L   Glucose, Bld 108 (H) 65 - 99 mg/dL   BUN 7 6 - 20 mg/dL   Creatinine, Ser 7.51 0.44 - 1.00 mg/dL   Calcium 8.4 (L) 8.9 - 10.3 mg/dL   GFR calc non Af Amer >60 >60 mL/min   GFR calc Af Amer >60 >60 mL/min   Anion gap 10 5 - 15  Sodium     Status: None   Collection Time: 12/21/17 11:02 AM  Result Value Ref Range   Sodium 138 135 - 145 mmol/L   Ct Head Wo Contrast  Result Date: 12/21/2017 CLINICAL DATA:  Stroke follow-up. EXAM: CT HEAD WITHOUT CONTRAST TECHNIQUE: Contiguous axial images were obtained from the base of the skull through the vertex without intravenous contrast. COMPARISON:  Brain MRI and CT 12/19/2017 FINDINGS: Brain: A large left PICA infarct and additional smaller bilateral cerebellar infarcts are unchanged from the prior studies. Associated edema and posterior fossa mass effect resulting in partial effacement of the fourth ventricle and quadrilateral plate cistern and mass effect on the brainstem are unchanged. Inferior tonsillar herniation and mild dilatation of the lateral ventricles are unchanged. There is no evidence of acute  supratentorial infarct. No extra-axial fluid collection or acute hemorrhage is identified. Vascular: Calcified atherosclerosis at the skull base, most notably involving the left vertebral artery. Skull: No fracture or focal osseous lesion. Sinuses/Orbits: Large left maxillary sinus mucous retention cyst with mild mucosal thickening scattered throughout the paranasal sinuses bilaterally. Clear mastoid air cells. Unremarkable orbits. Other: None. IMPRESSION: Unchanged edema and mass effect associated with acute left larger than right cerebellar infarcts. Unchanged inferior tonsillar herniation and mild obstructive hydrocephalus. No acute hemorrhage. Electronically Signed   By: Sebastian Ache M.D.   On: 12/21/2017 07:25   Mr Maxine Glenn Head Wo Contrast  Addendum Date: 12/19/2017   ADDENDUM REPORT: 12/19/2017 16:00 ADDENDUM: 20 cc MultiHance administered. Electronically Signed   By: Mitzi Hansen M.D.   On: 12/19/2017 16:00   Result Date: 12/19/2017 CLINICAL DATA:  52 y/o  F; acute left PICA distribution infarction. EXAM: MRI HEAD  WITHOUT AND WITH CONTRAST MRA HEAD WITHOUT CONTRAST MRA NECK WITHOUT AND WITH CONTRAST TECHNIQUE: Multiplanar, multiecho pulse sequences of the brain and surrounding structures were obtained without and with intravenous contrast. Angiographic images of the Circle of Willis were obtained using MRA technique without intravenous contrast. Angiographic images of the neck were obtained using MRA technique without and with intravenous contrast. Carotid stenosis measurements (when applicable) are obtained utilizing NASCET criteria, using the distal internal carotid diameter as the denominator. COMPARISON:  None. FINDINGS: MRI HEAD FINDINGS Severe motion degradation of multiple sequences. Brain: Reduced diffusion within the left inferior cerebellar hemisphere in PICA distribution compatible with acute/early subacute infarct additional very small areas of acute/early subacute infarction within  the left superior cerebellar hemisphere, vermis, and right superior cerebellar hemisphere. Infarcts demonstrate T2 FLAIR hyperintense signal abnormality and mild local mass effect with partial effacement of the fourth ventricle, left quadrigeminal plate cistern, and downward cerebellar tonsillar descent. Motion degraded SWI sequence, no gross hemorrhage. Findings are stable in comparison with prior CT of the head given differences in technique. Stable mild enlargement of the lateral ventricles. No extra-axial collection no additional signal abnormality elsewhere in the brain. No abnormal enhancement of the brain. Vascular: As below. Skull and upper cervical spine: Normal marrow signal. Sinuses/Orbits: Patchy opacification of ethmoid sinuses, large left maxillary sinus mucous retention cyst, and diffuse paranasal sinus mucosal thickening. No abnormal signal of mastoid air cells. Orbits are unremarkable. Other: Negative. MRA HEAD FINDINGS Severe motion degradation. Internal carotid arteries:  Patent. Anterior cerebral arteries:  Patent. Middle cerebral arteries: Patent. Left MCA bifurcation fusiform aneurysm is poorly visualized on the current study due to motion artifact. Anterior communicating artery: Right A1/A-comm junction prominent infundibulum versus 3 x 3 mm aneurysm (series 4, image 75). Posterior communicating arteries:  Patent.  Bilateral fetal PCA. Posterior cerebral arteries:  Patent. Basilar artery:  Diffuse contour irregularity. Vertebral arteries: The right vertebral artery terminates in the right PICA and the downstream right vertebral artery is persistently occluded. Unchanged irregularity of the left V4 segment probably corresponding to thrombus as seen on CTA. No appreciable left PICA. No additional large vessel occlusion or aneurysm identified. MRA NECK FINDINGS Severe motion degradation. Aortic arch: Patent. Right common carotid artery: Patent. Right internal carotid artery: Patent. There may be  a mild degree of stenosis of the proximal right internal carotid artery. Right vertebral artery: Patent. Left common carotid artery: Patent. Left Internal carotid artery: Patent. Left Vertebral artery: Patent. There is no definite evidence of hemodynamically significant stenosis by NASCET criteria, occlusion, or aneurysm unless noted above. IMPRESSION: MRI head: 1. Severe motion artifact. 2. Left PICA distribution acute/early subacute infarction and multiple additional very small infarcts in the superior cerebellar hemispheres bilaterally with edema and local mass effect resulting in effacement of fourth ventricle, downward descent of cerebellar tonsils, and partial effacement of quadrigeminal plate cistern. No gross hemorrhage. Findings are stable from prior CT of head given differences in technique. 3. Mild to moderate paranasal sinus disease. MRA head: 1. Severe motion artifact. 2. Anterior communicating artery 3 mm prominent infundibulum versus aneurysm better characterized on prior CTA of head. 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 3. 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 4. Mid left V4 signal irregularity probably corresponding to thrombus prior CTA. 5. No new large vessel occlusion or aneurysm identified. MRA neck: 1. Severe motion artifact. 2. Patent carotid and vertebral arteries of the neck. 3. Possible mild right proximal ICA  stenosis, suboptimal assessment due to extensive motion artifact. Consider carotid Doppler or repeat CTA/MRA when patient is able to hold still. Electronically Signed: By: Mitzi Hansen M.D. On: 12/19/2017 15:07   Mr Maxine Glenn Neck W Wo Contrast  Addendum Date: 12/19/2017   ADDENDUM REPORT: 12/19/2017 16:00 ADDENDUM: 20 cc MultiHance administered. Electronically Signed   By: Mitzi Hansen M.D.   On: 12/19/2017 16:00   Result Date: 12/19/2017 CLINICAL DATA:  52 y/o  F; acute left PICA  distribution infarction. EXAM: MRI HEAD WITHOUT AND WITH CONTRAST MRA HEAD WITHOUT CONTRAST MRA NECK WITHOUT AND WITH CONTRAST TECHNIQUE: Multiplanar, multiecho pulse sequences of the brain and surrounding structures were obtained without and with intravenous contrast. Angiographic images of the Circle of Willis were obtained using MRA technique without intravenous contrast. Angiographic images of the neck were obtained using MRA technique without and with intravenous contrast. Carotid stenosis measurements (when applicable) are obtained utilizing NASCET criteria, using the distal internal carotid diameter as the denominator. COMPARISON:  None. FINDINGS: MRI HEAD FINDINGS Severe motion degradation of multiple sequences. Brain: Reduced diffusion within the left inferior cerebellar hemisphere in PICA distribution compatible with acute/early subacute infarct additional very small areas of acute/early subacute infarction within the left superior cerebellar hemisphere, vermis, and right superior cerebellar hemisphere. Infarcts demonstrate T2 FLAIR hyperintense signal abnormality and mild local mass effect with partial effacement of the fourth ventricle, left quadrigeminal plate cistern, and downward cerebellar tonsillar descent. Motion degraded SWI sequence, no gross hemorrhage. Findings are stable in comparison with prior CT of the head given differences in technique. Stable mild enlargement of the lateral ventricles. No extra-axial collection no additional signal abnormality elsewhere in the brain. No abnormal enhancement of the brain. Vascular: As below. Skull and upper cervical spine: Normal marrow signal. Sinuses/Orbits: Patchy opacification of ethmoid sinuses, large left maxillary sinus mucous retention cyst, and diffuse paranasal sinus mucosal thickening. No abnormal signal of mastoid air cells. Orbits are unremarkable. Other: Negative. MRA HEAD FINDINGS Severe motion degradation. Internal carotid arteries:   Patent. Anterior cerebral arteries:  Patent. Middle cerebral arteries: Patent. Left MCA bifurcation fusiform aneurysm is poorly visualized on the current study due to motion artifact. Anterior communicating artery: Right A1/A-comm junction prominent infundibulum versus 3 x 3 mm aneurysm (series 4, image 75). Posterior communicating arteries:  Patent.  Bilateral fetal PCA. Posterior cerebral arteries:  Patent. Basilar artery:  Diffuse contour irregularity. Vertebral arteries: The right vertebral artery terminates in the right PICA and the downstream right vertebral artery is persistently occluded. Unchanged irregularity of the left V4 segment probably corresponding to thrombus as seen on CTA. No appreciable left PICA. No additional large vessel occlusion or aneurysm identified. MRA NECK FINDINGS Severe motion degradation. Aortic arch: Patent. Right common carotid artery: Patent. Right internal carotid artery: Patent. There may be a mild degree of stenosis of the proximal right internal carotid artery. Right vertebral artery: Patent. Left common carotid artery: Patent. Left Internal carotid artery: Patent. Left Vertebral artery: Patent. There is no definite evidence of hemodynamically significant stenosis by NASCET criteria, occlusion, or aneurysm unless noted above. IMPRESSION: MRI head: 1. Severe motion artifact. 2. Left PICA distribution acute/early subacute infarction and multiple additional very small infarcts in the superior cerebellar hemispheres bilaterally with edema and local mass effect resulting in effacement of fourth ventricle, downward descent of cerebellar tonsils, and partial effacement of quadrigeminal plate cistern. No gross hemorrhage. Findings are stable from prior CT of head given differences in technique. 3. Mild to moderate paranasal sinus disease.  MRA head: 1. Severe motion artifact. 2. Anterior communicating artery 3 mm prominent infundibulum versus aneurysm better characterized on prior CTA  of head. 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 3. 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 4. Mid left V4 signal irregularity probably corresponding to thrombus prior CTA. 5. No new large vessel occlusion or aneurysm identified. MRA neck: 1. Severe motion artifact. 2. Patent carotid and vertebral arteries of the neck. 3. Possible mild right proximal ICA stenosis, suboptimal assessment due to extensive motion artifact. Consider carotid Doppler or repeat CTA/MRA when patient is able to hold still. Electronically Signed: By: Mitzi Hansen M.D. On: 12/19/2017 15:07   Mr Laqueta Jean ZO Contrast  Addendum Date: 12/19/2017   ADDENDUM REPORT: 12/19/2017 16:00 ADDENDUM: 20 cc MultiHance administered. Electronically Signed   By: Mitzi Hansen M.D.   On: 12/19/2017 16:00   Result Date: 12/19/2017 CLINICAL DATA:  52 y/o  F; acute left PICA distribution infarction. EXAM: MRI HEAD WITHOUT AND WITH CONTRAST MRA HEAD WITHOUT CONTRAST MRA NECK WITHOUT AND WITH CONTRAST TECHNIQUE: Multiplanar, multiecho pulse sequences of the brain and surrounding structures were obtained without and with intravenous contrast. Angiographic images of the Circle of Willis were obtained using MRA technique without intravenous contrast. Angiographic images of the neck were obtained using MRA technique without and with intravenous contrast. Carotid stenosis measurements (when applicable) are obtained utilizing NASCET criteria, using the distal internal carotid diameter as the denominator. COMPARISON:  None. FINDINGS: MRI HEAD FINDINGS Severe motion degradation of multiple sequences. Brain: Reduced diffusion within the left inferior cerebellar hemisphere in PICA distribution compatible with acute/early subacute infarct additional very small areas of acute/early subacute infarction within the left superior cerebellar hemisphere, vermis, and right superior cerebellar  hemisphere. Infarcts demonstrate T2 FLAIR hyperintense signal abnormality and mild local mass effect with partial effacement of the fourth ventricle, left quadrigeminal plate cistern, and downward cerebellar tonsillar descent. Motion degraded SWI sequence, no gross hemorrhage. Findings are stable in comparison with prior CT of the head given differences in technique. Stable mild enlargement of the lateral ventricles. No extra-axial collection no additional signal abnormality elsewhere in the brain. No abnormal enhancement of the brain. Vascular: As below. Skull and upper cervical spine: Normal marrow signal. Sinuses/Orbits: Patchy opacification of ethmoid sinuses, large left maxillary sinus mucous retention cyst, and diffuse paranasal sinus mucosal thickening. No abnormal signal of mastoid air cells. Orbits are unremarkable. Other: Negative. MRA HEAD FINDINGS Severe motion degradation. Internal carotid arteries:  Patent. Anterior cerebral arteries:  Patent. Middle cerebral arteries: Patent. Left MCA bifurcation fusiform aneurysm is poorly visualized on the current study due to motion artifact. Anterior communicating artery: Right A1/A-comm junction prominent infundibulum versus 3 x 3 mm aneurysm (series 4, image 75). Posterior communicating arteries:  Patent.  Bilateral fetal PCA. Posterior cerebral arteries:  Patent. Basilar artery:  Diffuse contour irregularity. Vertebral arteries: The right vertebral artery terminates in the right PICA and the downstream right vertebral artery is persistently occluded. Unchanged irregularity of the left V4 segment probably corresponding to thrombus as seen on CTA. No appreciable left PICA. No additional large vessel occlusion or aneurysm identified. MRA NECK FINDINGS Severe motion degradation. Aortic arch: Patent. Right common carotid artery: Patent. Right internal carotid artery: Patent. There may be a mild degree of stenosis of the proximal right internal carotid artery. Right  vertebral artery: Patent. Left common carotid artery: Patent. Left Internal carotid artery: Patent. Left Vertebral artery: Patent. There is  no definite evidence of hemodynamically significant stenosis by NASCET criteria, occlusion, or aneurysm unless noted above. IMPRESSION: MRI head: 1. Severe motion artifact. 2. Left PICA distribution acute/early subacute infarction and multiple additional very small infarcts in the superior cerebellar hemispheres bilaterally with edema and local mass effect resulting in effacement of fourth ventricle, downward descent of cerebellar tonsils, and partial effacement of quadrigeminal plate cistern. No gross hemorrhage. Findings are stable from prior CT of head given differences in technique. 3. Mild to moderate paranasal sinus disease. MRA head: 1. Severe motion artifact. 2. Anterior communicating artery 3 mm prominent infundibulum versus aneurysm better characterized on prior CTA of head. 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 3. 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 4. Mid left V4 signal irregularity probably corresponding to thrombus prior CTA. 5. No new large vessel occlusion or aneurysm identified. MRA neck: 1. Severe motion artifact. 2. Patent carotid and vertebral arteries of the neck. 3. Possible mild right proximal ICA stenosis, suboptimal assessment due to extensive motion artifact. Consider carotid Doppler or repeat CTA/MRA when patient is able to hold still. Electronically Signed: By: Mitzi Hansen M.D. On: 12/19/2017 15:07    Assessment/Plan: Diagnosis: Left PICA infarct with truncal ataxia 1. Does the need for close, 24 hr/day medical supervision in concert with the patient's rehab needs make it unreasonable for this patient to be served in a less intensive setting? Potentially 2. Co-Morbidities requiring supervision/potential complications: Hypertension 3. Due to bladder  management, bowel management, safety, skin/wound care, disease management, medication administration and patient education, does the patient require 24 hr/day rehab nursing? Potentially 4. Does the patient require coordinated care of a physician, rehab nurse, PT, OT to address physical and functional deficits in the context of the above medical diagnosis(es)? Potentially Addressing deficits in the following areas: balance, endurance, locomotion, strength, transferring, bowel/bladder control, bathing, dressing and toileting 5. Can the patient actively participate in an intensive therapy program of at least 3 hrs of therapy per day at least 5 days per week? Yes 6. The potential for patient to make measurable gains while on inpatient rehab is good 7. Anticipated functional outcomes upon discharge from inpatient rehab are modified independent  with PT, modified independent with OT, n/a with SLP. 8. Estimated rehab length of stay to reach the above functional goals is: NA 9. Anticipated D/C setting: Home 10. Anticipated post D/C treatments: HH therapy 11. Overall Rehab/Functional Prognosis: good  RECOMMENDATIONS: This patient's condition is appropriate for continued rehabilitative care in the following setting: [facility] Therapy Patient has agreed to participate in recommended program. Yes Note that insurance prior authorization may be required for reimbursement for recommended care.  Comment: Last PT note indicates min assist ambulation however patient states that she was able to ambulate without an assistive device with supervision today.  She also was able to stand with one foot in front of the other and her eyes closed today. Based on this, I would expect that she can be managed at home with home health or outpatient therapy. If function declines may consider a very short inpatient rehab stay  Erick Colace, MD 12/21/2017

## 2017-12-21 NOTE — Progress Notes (Signed)
Patient ID: Nicole [name], female   DOB: 1966-06-17, 52 y.o.   MRN: [id number] Pt seen and examined, awake alert conversant MAEx4, doing relatively well, obviously doesn't need decompressive surgery, will sign off, call if I can help in any way

## 2017-12-21 NOTE — Care Management Note (Signed)
Case Management Note  Patient Details  Name: Nicha Zora MRN: [id number] Date of Birth: 1966/04/25  Subjective/Objective:     52 y.o. female admitted on 12/18/17 for dizziness and HA with resultant N/V/D.  MRI and CT confirmed acute CVA.  PTA, pt independent, lives with significant other.                 Action/Plan: PT/OT recommending CIR, and consult in process.  Will follow for discharge planning as pt progresses.    Expected Discharge Date:                  Expected Discharge Plan:  IP Rehab Facility  In-House Referral:     Discharge planning Services  CM Consult  Post Acute Care Choice:    Choice offered to:     DME Arranged:    DME Agency:     HH Arranged:    HH Agency:     Status of Service:  In process, will continue to follow  If discussed at Long Length of Stay Meetings, dates discussed:    Additional Comments:  Quintella Baton, RN, BSN  Trauma/Neuro ICU Case Manager [phone]

## 2017-12-21 NOTE — Progress Notes (Signed)
NEUROHOSPITALISTS STROKE TEAM - DAILY PROGRESS NOTE   ADMISSION HISTORY:  Nicole [name] is an 52 y.o. female who presented to the ED with severe occipital headache, N/V/D. Her PMHx is significant for asthma, obesity, migraines, HTN and HLD. She stated to the ED staff that she has had a migraine headache for the past 2 days. Further interview with neurology reveals that her symptoms began on Wednesday evening while eating dinner; she had abrupt onset of vertigo with nausea and vomiting; concomitantly, she developed a severe occipital headache, worse on the left. Denies neck pain and no preceding trauma or rapid neck extension, flexion or rotation. Since than, she has been unable to control the vomiting and the headache. She normally takes Tylenol for pain but the headache got worse. She denied to the ED staff URI symptoms, chest pain, SOB, cough, abdominal pain or urinary symptoms. She endorsed feeling extremely lightheaded. She stated to the ED staff that her headache felt like a typical migraine. She tried to take Imitrex and Zofran at home without relief.   Headache pain in the ED was rated as 9/10. After a migraine cocktail and IVF, she reported that her headache was improving and asked to go home. However, at time of d/c she stated that she continued to have dizziness and felt like she could not go home yet. A second bag of IV fluids was ordered, following which the patient again reported feeling better and stated that she would like to go home. At that time she tried to stand and walk and stated that the back of her head was with severe throbbing pain and she felt too dizzy to walk. She then reported that she had a fall 2 days ago and hit the back of her head with possible LOC. She also stated at that time that her presenting symptoms began after the fall.   LSN: Wednesday evening tPA Given: No: Out of the time window  SUBJECTIVE (INTERVAL  HISTORY) Her fiancee and RN are   at the bedside. Patient is found laying in bed in NAD. Overall she feels her condition is unchanged. Voices no new complaints. No new events reported overnight. Patient notes improvement in her  H/A and dizziness.  Her serum sodium remains suboptimal at 137 but she no longer has a central line and is getting 3% saline at 75 mL an hour only and she does not want a central line unless her neurological condition deteriorates   OBJECTIVE Lab Results: CBC:  Recent Labs  Lab 12/18/17 1416 12/20/17 0245 12/21/17 0354  WBC 12.7* 9.2 10.2  HGB 12.4 10.2* 9.7*  HCT 39.5 33.9* 32.9*  MCV 80.3 81.9 81.8  PLT 212 174 196   BMP: Recent Labs  Lab 12/18/17 1416  12/19/17 1115  12/20/17 0245 12/20/17 0907 12/20/17 1453 12/20/17 2142 12/21/17 0354 12/21/17 1102  NA 131*   < > 145   < > 142  142 140 145 138 137 138  K 3.5  --  3.4*  --  3.4*  --   --   --  3.1*  --   CL 97*  --  112*  --  107  --   --   --  102  --   CO2 21*  --  23  --  24  --   --   --  25  --   GLUCOSE 137*  --  100*  --  100*  --   --   --  108*  --   BUN 9  --  11  --  7  --   --   --  7  --   CREATININE 0.65  --  0.62  --  0.63  --   --   --  0.57  --   CALCIUM 9.2  --  8.1*  --  8.5*  --   --   --  8.4*  --   MG  --   --  2.1  --   --   --   --   --   --   --    < > = values in this interval not displayed.   Liver Function Tests:  Recent Labs  Lab 12/18/17 1416  AST 22  ALT 16  ALKPHOS 105  BILITOT 0.9  PROT 8.6*  ALBUMIN 3.9   Coagulation Studies:  Recent Labs    12/19/17 0447  APTT 27  INR 1.14   Urinalysis:  Recent Labs  Lab 12/18/17 1455  COLORURINE AMBER*  APPEARANCEUR CLOUDY*  LABSPEC 1.028  PHURINE 6.0  GLUCOSEU NEGATIVE  HGBUR SMALL*  BILIRUBINUR NEGATIVE  KETONESUR NEGATIVE  PROTEINUR 100*  NITRITE NEGATIVE  LEUKOCYTESUR NEGATIVE   PHYSICAL EXAM Temp:  [97.7 F (36.5 C)-98.5 F (36.9 C)] 98.3 F (36.8 C) (02/06 1156) Pulse Rate:  [72-85] 77  (02/06 1200) Resp:  [17-23] 18 (02/06 1200) BP: (103-153)/(61-93) 146/85 (02/06 1200) SpO2:  [86 %-99 %] 86 % (02/06 1200) Weight:  [300 lb 11.3 oz (136.4 kg)] 300 lb 11.3 oz (136.4 kg) (02/06 0500) General - Well nourished, well developed middle aged lady, in no apparent distress HEENT-  Normocephalic,  Cardiovascular - Regular rate and rhythm  Respiratory - Lungs clear bilaterally. No wheezing. Abdomen - soft and non-tender, BS normal Extremities- no edema or cyanosis Neurologic Examination:  Mental Status: Awake and alert. Fully oriented. No dysarthria. Speech fluent with intact comprehension and naming. Able to comprehend all questions accurately. Normal insight, Concentration intact with ability to perform simple addition and accurately recite months of the year in order.  Cranial Nerves: II:  Visual fields intact to left and right. PERRL at 3 mm >> 2 mm.   III,IV, VI: Drift of eyes to right with prominent fast beating horizontal nystagmus to the left;  rotatory component present with upgaze. Able to cross midline to the left. Horzontal nystagmus is present in all directions of gaze, but decreases with downgaze.   V,VII: Smile symmetric, facial temp sensation decreased on the left VIII: hearing intact to voice IX,X: Unable to visualize palate due to severe nausea XI: Symmetric XII: Unable to assess tongue protrusion due to severe nausea Motor: Equal strength in all 4 limbs.  Maximum strength elicited in upper extremities is 4/5 proximally and distally in the context of nausea, vertigo and severe occipital headache. No asymmetry.  Knee extension and ADF/APF 4+/5 bilateral lower extremities without asymmetry.  Sensory: Temp decreased to LUE. Temp sensation normal to RUE and bilateral lower extremities. FT intact x 4 without extinction.   Deep Tendon Reflexes:   Bilaterally symmetrical and easily elicitable Toes downgoing bilaterally Cerebellar: Dysmetria with slow FNF bilaterally.  Heel-shin normal Gait: Not tested  IMAGING: I have personally reviewed the radiological images below and agree with the radiology interpretations. Ct Head Wo Contrast  Result Date: 12/21/2017 CLINICAL DATA:  Stroke follow-up. EXAM: CT HEAD WITHOUT CONTRAST TECHNIQUE: Contiguous axial images were obtained from the base of the skull through the vertex without intravenous  contrast. COMPARISON:  Brain MRI and CT 12/19/2017 FINDINGS: Brain: A large left PICA infarct and additional smaller bilateral cerebellar infarcts are unchanged from the prior studies. Associated edema and posterior fossa mass effect resulting in partial effacement of the fourth ventricle and quadrilateral plate cistern and mass effect on the brainstem are unchanged. Inferior tonsillar herniation and mild dilatation of the lateral ventricles are unchanged. There is no evidence of acute supratentorial infarct. No extra-axial fluid collection or acute hemorrhage is identified. Vascular: Calcified atherosclerosis at the skull base, most notably involving the left vertebral artery. Skull: No fracture or focal osseous lesion. Sinuses/Orbits: Large left maxillary sinus mucous retention cyst with mild mucosal thickening scattered throughout the paranasal sinuses bilaterally. Clear mastoid air cells. Unremarkable orbits. Other: None. IMPRESSION: Unchanged edema and mass effect associated with acute left larger than right cerebellar infarcts. Unchanged inferior tonsillar herniation and mild obstructive hydrocephalus. No acute hemorrhage. Electronically Signed   By: Sebastian Ache M.D.   On: 12/21/2017 07:25   Mr Maxine Glenn Head Wo Contrast  Addendum Date: 12/19/2017   ADDENDUM REPORT: 12/19/2017 16:00 ADDENDUM: 20 cc MultiHance administered. Electronically Signed   By: Mitzi Hansen M.D.   On: 12/19/2017 16:00   Result Date: 12/19/2017 CLINICAL DATA:  52 y/o  F; acute left PICA distribution infarction. EXAM: MRI HEAD WITHOUT AND WITH CONTRAST MRA  HEAD WITHOUT CONTRAST MRA NECK WITHOUT AND WITH CONTRAST TECHNIQUE: Multiplanar, multiecho pulse sequences of the brain and surrounding structures were obtained without and with intravenous contrast. Angiographic images of the Circle of Willis were obtained using MRA technique without intravenous contrast. Angiographic images of the neck were obtained using MRA technique without and with intravenous contrast. Carotid stenosis measurements (when applicable) are obtained utilizing NASCET criteria, using the distal internal carotid diameter as the denominator. COMPARISON:  None. FINDINGS: MRI HEAD FINDINGS Severe motion degradation of multiple sequences. Brain: Reduced diffusion within the left inferior cerebellar hemisphere in PICA distribution compatible with acute/early subacute infarct additional very small areas of acute/early subacute infarction within the left superior cerebellar hemisphere, vermis, and right superior cerebellar hemisphere. Infarcts demonstrate T2 FLAIR hyperintense signal abnormality and mild local mass effect with partial effacement of the fourth ventricle, left quadrigeminal plate cistern, and downward cerebellar tonsillar descent. Motion degraded SWI sequence, no gross hemorrhage. Findings are stable in comparison with prior CT of the head given differences in technique. Stable mild enlargement of the lateral ventricles. No extra-axial collection no additional signal abnormality elsewhere in the brain. No abnormal enhancement of the brain. Vascular: As below. Skull and upper cervical spine: Normal marrow signal. Sinuses/Orbits: Patchy opacification of ethmoid sinuses, large left maxillary sinus mucous retention cyst, and diffuse paranasal sinus mucosal thickening. No abnormal signal of mastoid air cells. Orbits are unremarkable. Other: Negative. MRA HEAD FINDINGS Severe motion degradation. Internal carotid arteries:  Patent. Anterior cerebral arteries:  Patent. Middle cerebral arteries:  Patent. Left MCA bifurcation fusiform aneurysm is poorly visualized on the current study due to motion artifact. Anterior communicating artery: Right A1/A-comm junction prominent infundibulum versus 3 x 3 mm aneurysm (series 4, image 75). Posterior communicating arteries:  Patent.  Bilateral fetal PCA. Posterior cerebral arteries:  Patent. Basilar artery:  Diffuse contour irregularity. Vertebral arteries: The right vertebral artery terminates in the right PICA and the downstream right vertebral artery is persistently occluded. Unchanged irregularity of the left V4 segment probably corresponding to thrombus as seen on CTA. No appreciable left PICA. No additional large vessel occlusion or aneurysm identified. MRA NECK FINDINGS Severe  motion degradation. Aortic arch: Patent. Right common carotid artery: Patent. Right internal carotid artery: Patent. There may be a mild degree of stenosis of the proximal right internal carotid artery. Right vertebral artery: Patent. Left common carotid artery: Patent. Left Internal carotid artery: Patent. Left Vertebral artery: Patent. There is no definite evidence of hemodynamically significant stenosis by NASCET criteria, occlusion, or aneurysm unless noted above. IMPRESSION: MRI head: 1. Severe motion artifact. 2. Left PICA distribution acute/early subacute infarction and multiple additional very small infarcts in the superior cerebellar hemispheres bilaterally with edema and local mass effect resulting in effacement of fourth ventricle, downward descent of cerebellar tonsils, and partial effacement of quadrigeminal plate cistern. No gross hemorrhage. Findings are stable from prior CT of head given differences in technique. 3. Mild to moderate paranasal sinus disease. MRA head: 1. Severe motion artifact. 2. Anterior communicating artery 3 mm prominent infundibulum versus aneurysm better characterized on prior CTA of head. Left MCA bifurcation aneurysm poorly visualized due to motion  artifact. 3. 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 4. Mid left V4 signal irregularity probably corresponding to thrombus prior CTA. 5. No new large vessel occlusion or aneurysm identified. MRA neck: 1. Severe motion artifact. 2. Patent carotid and vertebral arteries of the neck. 3. Possible mild right proximal ICA stenosis, suboptimal assessment due to extensive motion artifact. Consider carotid Doppler or repeat CTA/MRA when patient is able to hold still. Electronically Signed: By: Mitzi Hansen M.D. On: 12/19/2017 15:07   Mr Maxine Glenn Neck W Wo Contrast  Addendum Date: 12/19/2017   ADDENDUM REPORT: 12/19/2017 16:00 ADDENDUM: 20 cc MultiHance administered. Electronically Signed   By: Mitzi Hansen M.D.   On: 12/19/2017 16:00   Result Date: 12/19/2017 CLINICAL DATA:  52 y/o  F; acute left PICA distribution infarction. EXAM: MRI HEAD WITHOUT AND WITH CONTRAST MRA HEAD WITHOUT CONTRAST MRA NECK WITHOUT AND WITH CONTRAST TECHNIQUE: Multiplanar, multiecho pulse sequences of the brain and surrounding structures were obtained without and with intravenous contrast. Angiographic images of the Circle of Willis were obtained using MRA technique without intravenous contrast. Angiographic images of the neck were obtained using MRA technique without and with intravenous contrast. Carotid stenosis measurements (when applicable) are obtained utilizing NASCET criteria, using the distal internal carotid diameter as the denominator. COMPARISON:  None. FINDINGS: MRI HEAD FINDINGS Severe motion degradation of multiple sequences. Brain: Reduced diffusion within the left inferior cerebellar hemisphere in PICA distribution compatible with acute/early subacute infarct additional very small areas of acute/early subacute infarction within the left superior cerebellar hemisphere, vermis, and right superior cerebellar hemisphere. Infarcts demonstrate T2 FLAIR hyperintense signal  abnormality and mild local mass effect with partial effacement of the fourth ventricle, left quadrigeminal plate cistern, and downward cerebellar tonsillar descent. Motion degraded SWI sequence, no gross hemorrhage. Findings are stable in comparison with prior CT of the head given differences in technique. Stable mild enlargement of the lateral ventricles. No extra-axial collection no additional signal abnormality elsewhere in the brain. No abnormal enhancement of the brain. Vascular: As below. Skull and upper cervical spine: Normal marrow signal. Sinuses/Orbits: Patchy opacification of ethmoid sinuses, large left maxillary sinus mucous retention cyst, and diffuse paranasal sinus mucosal thickening. No abnormal signal of mastoid air cells. Orbits are unremarkable. Other: Negative. MRA HEAD FINDINGS Severe motion degradation. Internal carotid arteries:  Patent. Anterior cerebral arteries:  Patent. Middle cerebral arteries: Patent. Left MCA bifurcation fusiform aneurysm is poorly visualized on the current study due to motion artifact. Anterior  communicating artery: Right A1/A-comm junction prominent infundibulum versus 3 x 3 mm aneurysm (series 4, image 75). Posterior communicating arteries:  Patent.  Bilateral fetal PCA. Posterior cerebral arteries:  Patent. Basilar artery:  Diffuse contour irregularity. Vertebral arteries: The right vertebral artery terminates in the right PICA and the downstream right vertebral artery is persistently occluded. Unchanged irregularity of the left V4 segment probably corresponding to thrombus as seen on CTA. No appreciable left PICA. No additional large vessel occlusion or aneurysm identified. MRA NECK FINDINGS Severe motion degradation. Aortic arch: Patent. Right common carotid artery: Patent. Right internal carotid artery: Patent. There may be a mild degree of stenosis of the proximal right internal carotid artery. Right vertebral artery: Patent. Left common carotid artery: Patent.  Left Internal carotid artery: Patent. Left Vertebral artery: Patent. There is no definite evidence of hemodynamically significant stenosis by NASCET criteria, occlusion, or aneurysm unless noted above. IMPRESSION: MRI head: 1. Severe motion artifact. 2. Left PICA distribution acute/early subacute infarction and multiple additional very small infarcts in the superior cerebellar hemispheres bilaterally with edema and local mass effect resulting in effacement of fourth ventricle, downward descent of cerebellar tonsils, and partial effacement of quadrigeminal plate cistern. No gross hemorrhage. Findings are stable from prior CT of head given differences in technique. 3. Mild to moderate paranasal sinus disease. MRA head: 1. Severe motion artifact. 2. Anterior communicating artery 3 mm prominent infundibulum versus aneurysm better characterized on prior CTA of head. 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 3. 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 4. Mid left V4 signal irregularity probably corresponding to thrombus prior CTA. 5. No new large vessel occlusion or aneurysm identified. MRA neck: 1. Severe motion artifact. 2. Patent carotid and vertebral arteries of the neck. 3. Possible mild right proximal ICA stenosis, suboptimal assessment due to extensive motion artifact. Consider carotid Doppler or repeat CTA/MRA when patient is able to hold still. Electronically Signed: By: Mitzi Hansen M.D. On: 12/19/2017 15:07   Mr Laqueta Jean AV Contrast  Addendum Date: 12/19/2017   ADDENDUM REPORT: 12/19/2017 16:00 ADDENDUM: 20 cc MultiHance administered. Electronically Signed   By: Mitzi Hansen M.D.   On: 12/19/2017 16:00   Result Date: 12/19/2017 CLINICAL DATA:  52 y/o  F; acute left PICA distribution infarction. EXAM: MRI HEAD WITHOUT AND WITH CONTRAST MRA HEAD WITHOUT CONTRAST MRA NECK WITHOUT AND WITH CONTRAST TECHNIQUE: Multiplanar,  multiecho pulse sequences of the brain and surrounding structures were obtained without and with intravenous contrast. Angiographic images of the Circle of Willis were obtained using MRA technique without intravenous contrast. Angiographic images of the neck were obtained using MRA technique without and with intravenous contrast. Carotid stenosis measurements (when applicable) are obtained utilizing NASCET criteria, using the distal internal carotid diameter as the denominator. COMPARISON:  None. FINDINGS: MRI HEAD FINDINGS Severe motion degradation of multiple sequences. Brain: Reduced diffusion within the left inferior cerebellar hemisphere in PICA distribution compatible with acute/early subacute infarct additional very small areas of acute/early subacute infarction within the left superior cerebellar hemisphere, vermis, and right superior cerebellar hemisphere. Infarcts demonstrate T2 FLAIR hyperintense signal abnormality and mild local mass effect with partial effacement of the fourth ventricle, left quadrigeminal plate cistern, and downward cerebellar tonsillar descent. Motion degraded SWI sequence, no gross hemorrhage. Findings are stable in comparison with prior CT of the head given differences in technique. Stable mild enlargement of the lateral ventricles. No extra-axial collection no additional signal abnormality elsewhere in  the brain. No abnormal enhancement of the brain. Vascular: As below. Skull and upper cervical spine: Normal marrow signal. Sinuses/Orbits: Patchy opacification of ethmoid sinuses, large left maxillary sinus mucous retention cyst, and diffuse paranasal sinus mucosal thickening. No abnormal signal of mastoid air cells. Orbits are unremarkable. Other: Negative. MRA HEAD FINDINGS Severe motion degradation. Internal carotid arteries:  Patent. Anterior cerebral arteries:  Patent. Middle cerebral arteries: Patent. Left MCA bifurcation fusiform aneurysm is poorly visualized on the current  study due to motion artifact. Anterior communicating artery: Right A1/A-comm junction prominent infundibulum versus 3 x 3 mm aneurysm (series 4, image 75). Posterior communicating arteries:  Patent.  Bilateral fetal PCA. Posterior cerebral arteries:  Patent. Basilar artery:  Diffuse contour irregularity. Vertebral arteries: The right vertebral artery terminates in the right PICA and the downstream right vertebral artery is persistently occluded. Unchanged irregularity of the left V4 segment probably corresponding to thrombus as seen on CTA. No appreciable left PICA. No additional large vessel occlusion or aneurysm identified. MRA NECK FINDINGS Severe motion degradation. Aortic arch: Patent. Right common carotid artery: Patent. Right internal carotid artery: Patent. There may be a mild degree of stenosis of the proximal right internal carotid artery. Right vertebral artery: Patent. Left common carotid artery: Patent. Left Internal carotid artery: Patent. Left Vertebral artery: Patent. There is no definite evidence of hemodynamically significant stenosis by NASCET criteria, occlusion, or aneurysm unless noted above. IMPRESSION: MRI head: 1. Severe motion artifact. 2. Left PICA distribution acute/early subacute infarction and multiple additional very small infarcts in the superior cerebellar hemispheres bilaterally with edema and local mass effect resulting in effacement of fourth ventricle, downward descent of cerebellar tonsils, and partial effacement of quadrigeminal plate cistern. No gross hemorrhage. Findings are stable from prior CT of head given differences in technique. 3. Mild to moderate paranasal sinus disease. MRA head: 1. Severe motion artifact. 2. Anterior communicating artery 3 mm prominent infundibulum versus aneurysm better characterized on prior CTA of head. 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 3. Stable right distal V4 occlusion and no visible left PICA, likely  occluded. 4. Mid left V4 signal irregularity probably corresponding to thrombus prior CTA. 5. No new large vessel occlusion or aneurysm identified. MRA neck: 1. Severe motion artifact. 2. Patent carotid and vertebral arteries of the neck. 3. Possible mild right proximal ICA stenosis, suboptimal assessment due to extensive motion artifact. Consider carotid Doppler or repeat CTA/MRA when patient is able to hold still. Electronically Signed: By: Mitzi Hansen M.D. On: 12/19/2017 15:07    Echocardiogram:     Left ventricle: The cavity size was normal. Wall thickness was   increased in a pattern of moderate LVH. Systolic function was   normal. The estimated ejection fraction was in the range of 60%   to 65%. Wall motion was normal; there were no regional wall   motion abnormalities                                          MRI; Left PICA distribution acute/early subacute infarction and multiple additional very small infarcts in the superior cerebellar hemispheres bilaterally with edema and local mass effect resulting in effacement of fourth ventricle, downward descent of cerebellar tonsils, and partial effacement of quadrigeminal plate cistern. No gross hemorrhage. /MRA Head/Neck  1.Severe motion artifact. 2. Anterior communicating artery 3 mm prominent infundibulum versus aneurysm better characterized on  prior CTA of head. 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 3. 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 4. Mid left V4 signal irregularity probably corresponding to thrombus prior CTA. 1 5.. 2. Patent carotid and vertebral arteries of the neck. 3. Possible mild right proximal ICA stenosis, suboptimal assessment due to extensive motion artifact.                                           IMPRESSION: Ms. Nicole [name] is a 52 y.o. female with PMH of HTN and HLD presenting with large left cerebellar and patchy right  cerebellar subacute ischemic infarctions with mass effect upon the 4th ventricle and medulla. 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 CTA head shows occluded LEFT PICA, LEFT V4 thromboembolism resulting in moderate stenosis with dissection not excluded, and severe stenosis of RIGHT PICA, secondary to dissection versus vasospasm or atherosclerosis.  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  Resultant Symptoms: H/A, N/V Stroke Risk Factors: hyperlipidemia and hypertension Other Stroke Risk Factors: Morbid Obesity, Body mass index is 53.27 kg/m.  Migraines, OSA/CPAP  Outstanding Stroke Work-up Studies:     Echocardiogram:                                                    PENDING MRI/MRA Head/Neck                                               PENDING  12/21/2017 ASSESSMENT:   Neuro exam remains stable. Central line placed overnight. 23.4% saline to be started this AM.  PLAN  12/21/2017: Continue Statin Continue ASA 81 mg, Nurse to verify allergy/intolerance is just nausea Head of bed 30-45 degrees. SBP Goal less than 180 Medicate for H/A pain, without over sedating Frequent neuro checks Telemetry monitoring PT/OT/SLP Consult PM & Rehab Consult Case Management /MSW Ongoing aggressive stroke risk factor management Patient counseled to be compliant with her medications Patient counseled on Lifestyle modifications including, Diet, Exercise, and Stress Follow up with [facility] in 6 weeks  DYSPHAGIA: Passes SLP swallow evaluation Aspiration Precautions in progress  CEREBRAL EDEMA: 23.4% NS Infusion per order set. Sodium goal 150-155. Na Levels every 6 hrs Neurosurgery consulted, Dr Yetta Barre - no need for emergent intervention at this time High risk for respiratory compromise & progression of cerebral edema next 2-3 days. Peak swelling from strokes is usually  3-5 days. At risk for sudden decompensation and death from acute noncommunicating hydrocephalus and/or brainstem compression/herniation Considerstatneurosurgical consultation for hemicraniectomy for mentation worsens or if there is any imaging evidence of herniation  HYPERTENSION: Stable SBP goal of < 180. DBP goal of < 105.  PRN Labetalol, Cleviprex infusion discontinued Long term BP goal normotensive. Restart home B/P medications - 12/19/2017 Home Meds: Metoprolol, HCTZ, Cardizem   HX of OSA/CPAP RT consulted  HYPERLIPIDEMIA:    Component Value Date/Time   CHOL 140 12/19/2017 0930   TRIG 107 12/19/2017 0930   HDL 34 (L) 12/19/2017 0930  CHOLHDL 4.1 12/19/2017 0930   VLDL 21 12/19/2017 0930   LDLCALC 85 12/19/2017 0930  Home Meds:  Lipitor 40 mg LDL  goal < 70 Increased to Lipitor to 80 mg daily Continue statin at discharge  R/O DIABETES: Lab Results  Component Value Date   HGBA1C 6.3 (H) 12/19/2017    PENDING No results for input(s): GLUCAP in the last 168 hours. HgbA1c goal < 7.0 Continue CBG monitoring and SSI to maintain glucose 140-180 mg/dl DM education, if necessary   OBESITY Morbid Obesity, Body mass index is 53.27 kg/m. Greater than/equal to 30  Other Active Problems: Active Problems:   New cerebellar infarct (HCC)   Encounter for central line placement   Nausea vomiting and diarrhea   Cytotoxic brain edema (HCC)   Hydrocephalus    Hospital day # 2 VTE prophylaxis: SCD's  Diet : Aspiration precautions Seizure precautions Diet heart healthy/carb modified Room service appropriate? Yes; Fluid consistency: Thin   FAMILY UPDATES: family at bedside  TEAM UPDATES: Scatliffe, Gypsy Balsam, MD   Prior Home Stroke Medications:  ASA 81 mg, not taking daily Discharge Stroke Meds:  Please discharge patient on TBD, will need to verify if patient has a true allery to ASA vs an intolerance    Disposition: 07-Left Against Medical Advice/Left Without Being  Seen/Elopement Therapy Recs:               PENDING Follow Up:  Follow-up Information    Micki Riley, MD. Schedule an appointment as soon as possible for a visit in 6 week(s).   Specialties:  Neurology, Radiology Contact information: [address] [place] [address] [phone]          System, Provider Not In -PCP Follow up in 1-2 weeks   Case Management aware of need     I have personally examined this patient, reviewed notes, independently viewed imaging studies, participated in medical decision making and plan of care.ROS completed by me personally and pertinent positives fully documented  I have made any additions or clarifications directly to the above note.    She presented with large left cerebellar infarct with cytotoxic edema and mass effect on the fourth ventricle and mild hydrocephalus. She remains at risk for neurological worsening. Continue hypertonic saline  And cose neurological monitoring and neurosurgical follow-up. Long discussion with the patient and answered questions.  The patient may need TEE and prolonged cardiac monitoring later to find source of embolism.  This patient is critically ill and at significant risk of neurological worsening, death and care requires constant monitoring of vital signs, hemodynamics,respiratory and cardiac monitoring, extensive review of multiple databases, frequent neurological assessment, discussion with family, other specialists and medical decision making of high complexity.I have made any additions or clarifications directly to the above note.This critical care time does not reflect procedure time, or teaching time or supervisory time of PA/NP/Med Resident etc but could involve care discussion time.  I spent 30 minutes of neurocritical care time  in the care of  this patient.      Delia Heady, MD Medical Director [facility] Pager: [phone] 12/21/2017 2:05 PM  To contact Stroke Continuity  provider, please refer to [email or web address]. After hours, contact General Neurology

## 2017-12-21 NOTE — Progress Notes (Signed)
Patient was on CPAP when RT entered room.  Patient is wearing FFM with 3 L O2 bleed in.     12/21/17 1945  BiPAP/CPAP/SIPAP  BiPAP/CPAP/SIPAP Pt Type Adult  Mask Type Full face mask  Mask Size Small  Respiratory Rate 20 breaths/min  EPAP 8 cmH2O  Flow Rate 3 lpm  BiPAP/CPAP/SIPAP CPAP  Patient Home Equipment No  Auto Titrate No

## 2017-12-22 DIAGNOSIS — G43809 Other migraine, not intractable, without status migrainosus: Secondary | ICD-10-CM

## 2017-12-22 LAB — SODIUM
SODIUM: 136 mmol/L (ref 135–145)
SODIUM: 136 mmol/L (ref 135–145)
Sodium: 133 mmol/L — ABNORMAL LOW (ref 135–145)
Sodium: 134 mmol/L — ABNORMAL LOW (ref 135–145)

## 2017-12-22 LAB — BASIC METABOLIC PANEL WITH GFR
Anion gap: 14 (ref 5–15)
BUN: 8 mg/dL (ref 6–20)
CO2: 27 mmol/L (ref 22–32)
Calcium: 8.6 mg/dL — ABNORMAL LOW (ref 8.9–10.3)
Chloride: 95 mmol/L — ABNORMAL LOW (ref 101–111)
Creatinine, Ser: 0.56 mg/dL (ref 0.44–1.00)
GFR calc Af Amer: >60 mL/min
GFR calc non Af Amer: >60 mL/min
Glucose, Bld: 67 mg/dL (ref 65–99)
Potassium: 3.7 mmol/L (ref 3.5–5.1)
Sodium: 136 mmol/L (ref 135–145)

## 2017-12-22 NOTE — Progress Notes (Signed)
NEUROHOSPITALISTS STROKE TEAM - DAILY PROGRESS NOTE      SUBJECTIVE (INTERVAL HISTORY) Her   RN  is  at the bedside. Patient is found laying in bed in NAD. Overall she feels her condition is unchanged. Voices no new complaints. No new events reported overnight. Patient notes improvement in her  H/A and dizziness.  Her serum sodium remains suboptimal at 136 but she no longer has a central line and is getting 3% saline at 75 mL an hour only and she does not want a central line unless her neurological condition deteriorates. Hypertonic saline drip has been tapered and will be discontinued this afternoon. Plan to transfer to stepdown unit if bed available.   OBJECTIVE Lab Results: CBC:  Recent Labs  Lab 12/18/17 1416 12/20/17 0245 12/21/17 0354  WBC 12.7* 9.2 10.2  HGB 12.4 10.2* 9.7*  HCT 39.5 33.9* 32.9*  MCV 80.3 81.9 81.8  PLT 212 174 196   BMP: Recent Labs  Lab 12/18/17 1416  12/19/17 1115  12/20/17 0245  12/21/17 0354  12/21/17 1443 12/22/17 0011 12/22/17 0347 12/22/17 0952 12/22/17 1454  NA 131*   < > 145   < > 142  142   < > 137   < > 138 136 136 133* 134*  K 3.5  --  3.4*  --  3.4*  --  3.1*  --   --   --  3.7  --   --   CL 97*  --  112*  --  107  --  102  --   --   --  95*  --   --   CO2 21*  --  23  --  24  --  25  --   --   --  27  --   --   GLUCOSE 137*  --  100*  --  100*  --  108*  --   --   --  67  --   --   BUN 9  --  11  --  7  --  7  --   --   --  8  --   --   CREATININE 0.65  --  0.62  --  0.63  --  0.57  --   --   --  0.56  --   --   CALCIUM 9.2  --  8.1*  --  8.5*  --  8.4*  --   --   --  8.6*  --   --   MG  --   --  2.1  --   --   --   --   --   --   --   --   --   --    < > = values in this interval not displayed.   Liver Function Tests:  Recent Labs  Lab 12/18/17 1416  AST 22  ALT 16  ALKPHOS 105  BILITOT 0.9  PROT 8.6*  ALBUMIN 3.9   Coagulation Studies:  No results for input(s):  APTT, INR in the last 72 hours. Urinalysis:  Recent Labs  Lab 12/18/17 1455  COLORURINE AMBER*  APPEARANCEUR CLOUDY*  LABSPEC 1.028  PHURINE 6.0  GLUCOSEU NEGATIVE  HGBUR SMALL*  BILIRUBINUR NEGATIVE  KETONESUR NEGATIVE  PROTEINUR 100*  NITRITE NEGATIVE  LEUKOCYTESUR NEGATIVE   PHYSICAL EXAM Temp:  [97.5 F (36.4 C)-98.5 F (36.9 C)] 97.9 F (36.6 C) (02/07 1603) Pulse Rate:  [71-95] 82 (02/07  1300) Resp:  [14-33] 33 (02/07 1300) BP: (123-158)/(71-102) 150/96 (02/07 1300) SpO2:  [82 %-100 %] 96 % (02/07 1300) Weight:  [295 lb 10.2 oz (134.1 kg)] 295 lb 10.2 oz (134.1 kg) (02/07 0500) General - Well nourished, well developed middle aged lady, in no apparent distress HEENT-  Normocephalic,  Cardiovascular - Regular rate and rhythm  Respiratory - Lungs clear bilaterally. No wheezing. Abdomen - soft and non-tender, BS normal Extremities- no edema or cyanosis Neurologic Examination:  Mental Status: Awake and alert. Fully oriented. No dysarthria. Speech fluent with intact comprehension and naming. Able to comprehend all questions accurately. Normal insight, Concentration intact with ability to perform simple addition and accurately recite months of the year in order.  Cranial Nerves: II:  Visual fields intact to left and right. PERRL at 3 mm >> 2 mm.   III,IV, VI: Drift of eyes to right with prominent fast beating horizontal nystagmus to the left;  rotatory component present with upgaze. Able to cross midline to the left. Horzontal nystagmus is present in all directions of gaze, but decreases with downgaze.   V,VII: Smile symmetric, facial temp sensation decreased on the left VIII: hearing intact to voice IX,X: Unable to visualize palate due to severe nausea XI: Symmetric XII: Unable to assess tongue protrusion due to severe nausea Motor: Equal strength in all 4 limbs.  Maximum strength elicited in upper extremities is 4/5 proximally and distally in the context of nausea,  vertigo and severe occipital headache. No asymmetry.  Knee extension and ADF/APF 4+/5 bilateral lower extremities without asymmetry.  Sensory: Temp decreased to LUE. Temp sensation normal to RUE and bilateral lower extremities. FT intact x 4 without extinction.   Deep Tendon Reflexes:   Bilaterally symmetrical and easily elicitable Toes downgoing bilaterally Cerebellar: Dysmetria with slow FNF bilaterally. Heel-shin normal Gait: Not tested  IMAGING: I have personally reviewed the radiological images below and agree with the radiology interpretations. Ct Head Wo Contrast  Result Date: 12/21/2017 CLINICAL DATA:  Stroke follow-up. EXAM: CT HEAD WITHOUT CONTRAST TECHNIQUE: Contiguous axial images were obtained from the base of the skull through the vertex without intravenous contrast. COMPARISON:  Brain MRI and CT 12/19/2017 FINDINGS: Brain: A large left PICA infarct and additional smaller bilateral cerebellar infarcts are unchanged from the prior studies. Associated edema and posterior fossa mass effect resulting in partial effacement of the fourth ventricle and quadrilateral plate cistern and mass effect on the brainstem are unchanged. Inferior tonsillar herniation and mild dilatation of the lateral ventricles are unchanged. There is no evidence of acute supratentorial infarct. No extra-axial fluid collection or acute hemorrhage is identified. Vascular: Calcified atherosclerosis at the skull base, most notably involving the left vertebral artery. Skull: No fracture or focal osseous lesion. Sinuses/Orbits: Large left maxillary sinus mucous retention cyst with mild mucosal thickening scattered throughout the paranasal sinuses bilaterally. Clear mastoid air cells. Unremarkable orbits. Other: None. IMPRESSION: Unchanged edema and mass effect associated with acute left larger than right cerebellar infarcts. Unchanged inferior tonsillar herniation and mild obstructive hydrocephalus. No acute hemorrhage.  Electronically Signed   By: Sebastian Ache M.D.   On: 12/21/2017 07:25    Echocardiogram:     Left ventricle: The cavity size was normal. Wall thickness was   increased in a pattern of moderate LVH. Systolic function was   normal. The estimated ejection fraction was in the range of 60%   to 65%. Wall motion was normal; there were no regional wall   motion abnormalities  MRI; Left PICA distribution acute/early subacute infarction and multiple additional very small infarcts in the superior cerebellar hemispheres bilaterally with edema and local mass effect resulting in effacement of fourth ventricle, downward descent of cerebellar tonsils, and partial effacement of quadrigeminal plate cistern. No gross hemorrhage. /MRA Head/Neck  1.Severe motion artifact. 2. Anterior communicating artery 3 mm prominent infundibulum versus aneurysm better characterized on prior CTA of head. 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 3. 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 4. Mid left V4 signal irregularity probably corresponding to thrombus prior CTA. 1 5.. 2. Patent carotid and vertebral arteries of the neck. 3. Possible mild right proximal ICA stenosis, suboptimal assessment due to extensive motion artifact.                                           IMPRESSION: Ms. Nicole [name] is a 52 y.o. 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 CTA head shows occluded LEFT PICA, LEFT V4 thromboembolism resulting in moderate stenosis with dissection not excluded, and severe stenosis of RIGHT PICA, secondary to dissection versus vasospasm or atherosclerosis.  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  Resultant Symptoms: H/A, N/V Stroke Risk Factors: hyperlipidemia and hypertension Other Stroke Risk Factors: Morbid Obesity, Body mass index is 52.37 kg/m.  Migraines, OSA/CPAP  12/22/2017 ASSESSMENT:   Neuro exam remains stable. Central line placed overnight. 23.4% saline to be started this AM.  PLAN  12/22/2017: Continue Statin Continue ASA 81 mg, Nurse to verify allergy/intolerance is just nausea Head of bed 30-45 degrees. SBP Goal less than 180 Medicate for H/A pain, without over sedating Frequent neuro checks Telemetry monitoring PT/OT/SLP Consult PM & Rehab Consult Case Management /MSW Ongoing aggressive stroke risk factor management Patient counseled to be compliant with her medications Patient counseled on Lifestyle modifications including, Diet, Exercise, and Stress Follow up with [facility] in 6 weeks  DYSPHAGIA: Passes SLP swallow evaluation Aspiration Precautions in progress  CEREBRAL EDEMA: 23.4% NS Infusion per order set. Sodium goal 150-155. Na Levels every 6 hrs Neurosurgery consulted, Dr Yetta Barre - no need for emergent intervention at this time High risk for respiratory compromise & progression of cerebral edema next 2-3 days. Peak swelling from strokes is usually 3-5 days. At risk for sudden decompensation and death from acute noncommunicating hydrocephalus and/or brainstem compression/herniation Considerstatneurosurgical consultation for hemicraniectomy for mentation worsens or if there is any imaging evidence of herniation  HYPERTENSION: Stable SBP goal of < 180. DBP goal of < 105.  PRN Labetalol, Cleviprex infusion discontinued Long term BP goal normotensive. Restart home B/P medications - 12/19/2017 Home Meds: Metoprolol, HCTZ, Cardizem   HX of OSA/CPAP RT consulted  HYPERLIPIDEMIA:    Component Value Date/Time   CHOL 140 12/19/2017 0930   TRIG  107 12/19/2017 0930   HDL 34 (L) 12/19/2017 0930   CHOLHDL 4.1 12/19/2017 0930   VLDL 21 12/19/2017 0930   LDLCALC 85 12/19/2017 0930  Home Meds:  Lipitor 40 mg LDL  goal < 70 Increased to Lipitor to 80 mg daily Continue statin at discharge  R/O DIABETES: Lab Results  Component Value Date   HGBA1C 6.3 (H) 12/19/2017    PENDING No results for input(s): GLUCAP in the last 168 hours. HgbA1c goal < 7.0 Continue CBG monitoring and SSI to maintain glucose 140-180 mg/dl DM education, if necessary   OBESITY Morbid Obesity, Body mass index is 52.37 kg/m. Greater than/equal to 30  Other Active Problems: Active Problems:   New cerebellar infarct (HCC)   Encounter for central line placement   Nausea vomiting and diarrhea   Cytotoxic brain edema (HCC)   Hydrocephalus    Hospital day # 3 VTE prophylaxis: SCD's  Diet : Aspiration precautions Diet heart healthy/carb modified Room service appropriate? Yes; Fluid consistency: Thin   FAMILY UPDATES: family at bedside  TEAM UPDATES: Rhetta Mura, MD   Prior Home Stroke Medications:  ASA 81 mg, not taking daily Discharge Stroke Meds:  Please discharge patient on TBD, will need to verify if patient has a true allery to ASA vs an intolerance    Disposition: 07-Left Against Medical Advice/Left Without Being Seen/Elopement Therapy Recs:               PENDING Follow Up:  Follow-up Information    Micki Riley, MD. Schedule an appointment as soon as possible for a visit in 6 week(s).   Specialties:  Neurology, Radiology Contact information: [address] [place] [address] [phone]          System, Provider Not In -PCP Follow up in 1-2 weeks   Case Management aware of need      . Long discussion with the patient and answered questions.  The patient may need TEE and prolonged cardiac monitoring later to find source of embolism in medical situation currently is too tenous to allow her to be sedated for  TEE.. I have reviewed telemetry strips on last 2-3 days in the ICU and not found definite evidence of atrial fibrillation so far. Discuss with Dr. Mahala Menghini. Patient is willing to participate in the [facility] stroke registry This patient is critically ill and at significant risk of neurological worsening, death and care requires constant monitoring of vital signs, hemodynamics,respiratory and cardiac monitoring, extensive review of multiple databases, frequent neurological assessment, discussion with family, other specialists and medical decision making of high complexity.I have made any additions or clarifications directly to the above note.This critical care time does not reflect procedure time, or teaching time or supervisory time of PA/NP/Med Resident etc but could involve care discussion time.  I spent 30 minutes of neurocritical care time  in the care of  this patient.      Delia Heady, MD Medical Director [facility] Pager: [phone] 12/22/2017 4:07 PM  To contact Stroke Continuity provider, please refer to [email or web address]. After hours, contact General Neurology

## 2017-12-22 NOTE — H&P (Signed)
Physical Medicine and Rehabilitation Admission H&P    Chief Complaint  Patient presents with  . Cerebrovascular Accident  : HPI: Nicole [name] is a 52 y.o. right handed female with history of migraine headaches, obesity, hyperlipidemia, hypertension and chronic low back pain maintained on Lidoderm patch. Per chart review patient lives with significant other. Independent prior to admission. One level home with 5 steps to entry. Significant other is an Mining engineer and has flexibility and work schedule. Presented 12/19/2017 with persistent dizziness, fall as well as nausea vomiting. CT of the head showed acute infarcts in the left more than right cerebellum with posterior fossa mass effect and foramen magnum stenosis. CTA of head with no large vessel occlusion or stenosis. Patient did not receive TPA. 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 No gross hemorrhage. MRA showed anterior communicating artery 3 mm prominent infundibulum versus aneurysm. Stable right distal V4 occlusion. No new large vessel occlusion. Neurosurgery Dr. Sherley Bounds consulted for review of CTA no surgical intervention at this time. Patient was placed on 3% saline and since discontinued. Latest follow-up cranial CT scan 12/23/2017 showed continued internal evolution of left larger than right bilateral cerebellar infarcts no evidence for hemorrhagic transformation. Noted associated mild obstructive hydrocephalus slightly improved from previous exam.  Echocardiogram with ejection fraction of 71% grade 1 diastolic dysfunction. Neurology consulted presently on aspirin for CVA prophylaxis. Tolerating a regular diet. Bouts of hypokalemia with supplement added. Physical and occupational therapy evaluations completed with recommendations of physical medicine  rehabilitation consult. Patient was admitted for a comprehensive rehabilitation program    Review of Systems  Constitutional: Negative for chills and fever.  HENT: Negative for hearing loss.   Eyes: Positive for blurred vision.  Respiratory: Negative for cough and shortness of breath.        Occasional shortness of breath with exertion  Cardiovascular: Negative for chest pain, palpitations and leg swelling.  Gastrointestinal: Positive for constipation, nausea and vomiting.  Genitourinary: Negative for dysuria, flank pain and hematuria.  Musculoskeletal: Positive for back pain and myalgias.  Skin: Negative for rash.  Neurological: Positive for dizziness and headaches. Negative for seizures.  Psychiatric/Behavioral: Positive for depression.  All other systems reviewed and are negative.  Past Medical History:  Diagnosis Date  . Asthma   . Depression   . Hyperlipemia   . Hypertension   . Migraines   . Obesity   . Sciatica    History reviewed. No pertinent surgical history. No family history on file. Social History:  reports that  has never smoked. she has never used smokeless tobacco. She reports that she does not drink alcohol or use drugs. Allergies:  Allergies  Allergen Reactions  . Asa [Aspirin] Other (See Comments)    Contraindication per provider - pt states not allergic, PCP stated did not go with treatment regimen, ok for pt to take medication  . Ibuprofen Nausea Only  . Morphine And Related Palpitations   Medications Prior to Admission  Medication Sig Dispense Refill  . atorvastatin (LIPITOR) 40 MG tablet Take 40 mg by mouth daily.    Marland Kitchen diltiazem (CARDIZEM CD) 240 MG 24 hr capsule Take 240 mg by mouth 2 (two) times daily.     Marland Kitchen FLUoxetine (PROZAC) 20 MG tablet Take 40 mg by mouth 2 (two) times daily.     . furosemide (LASIX) 20  MG tablet Take 20 mg by mouth 2 (two) times daily.    . hydrochlorothiazide (HYDRODIURIL) 25 MG tablet Take 25 mg by mouth daily.    Marland Kitchen  lidocaine (LIDODERM) 5 % Place 1 patch onto the skin daily. Remove & Discard patch within 12 hours or as directed by MD 30 patch 0  . metoprolol succinate (TOPROL-XL) 25 MG 24 hr tablet Take 25 mg by mouth daily.  5  . omeprazole (PRILOSEC) 20 MG capsule Take 20 mg by mouth at bedtime.    Marland Kitchen zolpidem (AMBIEN) 5 MG tablet Take 5 mg by mouth at bedtime as needed for sleep.      Drug Regimen Review Drug regimen was reviewed and remains appropriate with no significant issues identified  Home: Home Living Family/patient expects to be discharged to:: Private residence Living Arrangements: Spouse/significant other(significant other is an Mining engineer and has flexibility) Available Help at Discharge: Family, Available 24 hours/day Type of Home: Apartment Home Access: Stairs to enter [employer] of Steps: 5 Entrance Stairs-Rails: Right Home Layout: One level Bathroom Shower/Tub: Tub/shower unit, Architectural technologist: Standard Home Equipment: None(was looking at getting a rollator due to chronic back pain)   Functional History: Prior Function Level of Independence: Independent Comments: Pt reports she has back pain that limits her at times, and she was considering the purchase of a rollator.  Limited driving, does not work, does household chores, cooking.   Functional Status:  Mobility: Bed Mobility Overal bed mobility: Needs Assistance Bed Mobility: Supine to Sit, Sit to Supine Supine to sit: Supervision, HOB elevated Sit to supine: Supervision General bed mobility comments: received in chair Transfers Overall transfer level: Needs assistance Equipment used: 2 person hand held assist Transfers: Sit to/from Stand Sit to Stand: +2 physical assistance, Min assist Stand pivot transfers: +2 physical assistance, Min assist General transfer comment: 2 person HHA for stability and safety upon elevation to upright. Noted left sway. VCs for hand placement and  targeting Ambulation/Gait Ambulation/Gait assistance: Min assist, Mod assist, +2 physical assistance Ambulation Distance (Feet): 3 Feet Assistive device: 2 person hand held assist Gait Pattern/deviations: Step-to pattern, Leaning posteriorly, Drifts right/left General Gait Details: patient requires 2 person min assist with one episode of moderate assist upon fatigue for LOB to the left. Patient performed ambulation in 40 ft trials with cues for targeting. decreased Gait velocity: non functional speed at this time Gait velocity interpretation: <1.8 ft/sec, indicative of risk for recurrent falls    ADL: ADL Overall ADL's : Needs assistance/impaired Eating/Feeding: Modified independent, Bed level Grooming: Wash/dry hands, Wash/dry face, Oral care, Brushing hair, Minimal assistance, Sitting Upper Body Bathing: Moderate assistance, Sitting Lower Body Bathing: Maximal assistance, Sit to/from stand Upper Body Dressing : Maximal assistance, Sitting Lower Body Dressing: Total assistance, Sit to/from stand Toilet Transfer: Minimal assistance, +2 for physical assistance, Stand-pivot, BSC Toileting- Clothing Manipulation and Hygiene: Total assistance, Sit to/from stand Functional mobility during ADLs: Minimal assistance, +2 for physical assistance General ADL Comments: limited by severe HA and dizziness   Cognition: Cognition Overall Cognitive Status: Within Functional Limits for tasks assessed Orientation Level: Oriented X4 Cognition Arousal/Alertness: Awake/alert Behavior During Therapy: Anxious(fearful of pain, fearful of the stroke, and fearful of movin) Overall Cognitive Status: Within Functional Limits for tasks assessed  Physical Exam: Blood pressure (!) 158/89, pulse 87, temperature 98.1 F (36.7 C), temperature source Oral, resp. rate (!) 22, height '5\' 3"'$  (1.6 m), weight 134.1 kg (295 lb 10.2 oz), last menstrual period 12/11/2017, SpO2 95 %.  Physical Exam  Vitals  reviewed. Constitutional: She appears well-developed and well-nourished.  HENT:  Head: Normocephalic and atraumatic.  Eyes: EOM are normal. Right eye exhibits no discharge. Left eye exhibits no discharge.  Neck: Normal range of motion. Neck supple. No JVD present. No thyromegaly present.  Cardiovascular: Normal rate, regular rhythm and normal heart sounds. Exam reveals no friction rub.  No murmur heard. Respiratory: Breath sounds normal. No respiratory distress. She has no wheezes.  GI: Soft. Bowel sounds are normal. She exhibits no distension. There is no tenderness. There is no rebound.  Musculoskeletal: Normal range of motion. She exhibits no edema.  Skin. Warm and dry Neurological. Alert and appropriate. No focal CN findings. Became dizzy and a few beats of lateral nystagmus with head turning to the left. Mild left upper and lower limb ataxia. Strength grossly 4+ to 5/5 RUE and RLE and 4 to 4+/5 LUE and LLE. Sensory function intact. Speech is clear. Normal insight and awareness Psych: pleasant and appropriate     Results for orders placed or performed during the hospital encounter of 12/18/17 (from the past 48 hour(s))  Sodium     Status: None   Collection Time: 12/20/17  9:07 AM  Result Value Ref Range   Sodium 140 135 - 145 mmol/L    Comment: Performed at [facility], [place] [address] [place], [place] [address]  Sodium     Status: None   Collection Time: 12/20/17  2:53 PM  Result Value Ref Range   Sodium 145 135 - 145 mmol/L    Comment: Performed at [facility], [place] [address] [place], [place] [address]  Sodium     Status: None   Collection Time: 12/20/17  9:42 PM  Result Value Ref Range   Sodium 138 135 - 145 mmol/L    Comment: Performed at [facility], [place] [address] [place], [place] [address]  CBC     Status: Abnormal   Collection Time: 12/21/17  3:54 AM  Result Value Ref Range   WBC 10.2 4.0 - 10.5 K/uL   RBC 4.02 3.87 - 5.11 MIL/uL   Hemoglobin  9.7 (L) 12.0 - 15.0 g/dL   HCT 32.9 (L) 36.0 - 46.0 %   MCV 81.8 78.0 - 100.0 fL   MCH 24.1 (L) 26.0 - 34.0 pg   MCHC 29.5 (L) 30.0 - 36.0 g/dL   RDW 14.6 11.5 - 15.5 %   Platelets 196 150 - 400 K/uL    Comment: Performed at [facility], [place] [address], [place], [place] [address]  Basic metabolic panel     Status: Abnormal   Collection Time: 12/21/17  3:54 AM  Result Value Ref Range   Sodium 137 135 - 145 mmol/L   Potassium 3.1 (L) 3.5 - 5.1 mmol/L   Chloride 102 101 - 111 mmol/L   CO2 25 22 - 32 mmol/L   Glucose, Bld 108 (H) 65 - 99 mg/dL   BUN 7 6 - 20 mg/dL   Creatinine, Ser 0.57 0.44 - 1.00 mg/dL   Calcium 8.4 (L) 8.9 - 10.3 mg/dL   GFR calc non Af Amer >60 >60 mL/min   GFR calc Af Amer >60 >60 mL/min    Comment: (NOTE) The eGFR has been calculated using the CKD EPI equation. This calculation has not been validated in all clinical situations. eGFR's persistently <60 mL/min signify possible Chronic Kidney Disease.    Anion gap 10 5 - 15  Comment: Performed at [facility], [place] [address], [place], [place] [address]  Sodium     Status: None   Collection Time: 12/21/17 11:02 AM  Result Value Ref Range   Sodium 138 135 - 145 mmol/L    Comment: Performed at [facility] [address], [place], [place] [address]  Sodium     Status: None   Collection Time: 12/21/17  2:43 PM  Result Value Ref Range   Sodium 138 135 - 145 mmol/L    Comment: Performed at [facility] [address], [place], [place] [address]  Sodium     Status: None   Collection Time: 12/22/17 12:11 AM  Result Value Ref Range   Sodium 136 135 - 145 mmol/L    Comment: Performed at [facility] [address] [place], [place] [address]  Basic metabolic panel     Status: Abnormal   Collection Time: 12/22/17  3:47 AM  Result Value Ref Range   Sodium 136 135 - 145 mmol/L   Potassium 3.7 3.5 - 5.1 mmol/L   Chloride 95 (L) 101 - 111 mmol/L   CO2 27 22 - 32 mmol/L   Glucose,  Bld 67 65 - 99 mg/dL   BUN 8 6 - 20 mg/dL   Creatinine, Ser 0.56 0.44 - 1.00 mg/dL   Calcium 8.6 (L) 8.9 - 10.3 mg/dL   GFR calc non Af Amer >60 >60 mL/min   GFR calc Af Amer >60 >60 mL/min    Comment: (NOTE) The eGFR has been calculated using the CKD EPI equation. This calculation has not been validated in all clinical situations. eGFR's persistently <60 mL/min signify possible Chronic Kidney Disease.    Anion gap 14 5 - 15    Comment: Performed at [facility] [address], [place], [place] [address]   Ct Head Wo Contrast  Result Date: 12/21/2017 CLINICAL DATA:  Stroke follow-up. EXAM: CT HEAD WITHOUT CONTRAST TECHNIQUE: Contiguous axial images were obtained from the base of the skull through the vertex without intravenous contrast. COMPARISON:  Brain MRI and CT 12/19/2017 FINDINGS: Brain: A large left PICA infarct and additional smaller bilateral cerebellar infarcts are unchanged from the prior studies. Associated edema and posterior fossa mass effect resulting in partial effacement of the fourth ventricle and quadrilateral plate cistern and mass effect on the brainstem are unchanged. Inferior tonsillar herniation and mild dilatation of the lateral ventricles are unchanged. There is no evidence of acute supratentorial infarct. No extra-axial fluid collection or acute hemorrhage is identified. Vascular: Calcified atherosclerosis at the skull base, most notably involving the left vertebral artery. Skull: No fracture or focal osseous lesion. Sinuses/Orbits: Large left maxillary sinus mucous retention cyst with mild mucosal thickening scattered throughout the paranasal sinuses bilaterally. Clear mastoid air cells. Unremarkable orbits. Other: None. IMPRESSION: Unchanged edema and mass effect associated with acute left larger than right cerebellar infarcts. Unchanged inferior tonsillar herniation and mild obstructive hydrocephalus. No acute hemorrhage. Electronically Signed   By: Logan Bores  M.D.   On: 12/21/2017 07:25       Medical Problem List and Plan: 1.  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  DVT Prophylaxis/Anticoagulation: SCDs. Monitor for any signs of DVT 3. Pain Management/migraine headaches/chronic back pain: Lidoderm patch, Fioricet for headaches, Ultram as needed 4. Mood: Prozac 40 mg twice a day 5. Neuropsych: This patient is capable of making decisions  on her own behalf. 6. Skin/Wound Care: Routine skin checks 7. Fluids/Electrolytes/Nutrition: Routine I&O's with follow-up chemistries 8. Hypertension. Toprol-XL 25 mg daily, HCTZ 25 mg daily, Lasix 20 mg twice a day, Cardizem 120 mg every 6 hours. Monitor with activity, adjust dosing as necessary 9. Hyperlipidemia. Lipitor 10. Obesity. BMI 53.27. Dietary follow-up   Post Admission Physician Evaluation: 1. Functional deficits secondary  to left PICA infarct. 2. Patient is admitted to receive collaborative, interdisciplinary care between the physiatrist, rehab nursing staff, and therapy team. 3. Patient's level of medical complexity and substantial therapy needs in context of that medical necessity cannot be provided at a lesser intensity of care such as a SNF. 4. Patient has experienced substantial functional loss from his/her baseline which was documented above under the "Functional History" and "Functional Status" headings.  Judging by the patient's diagnosis, physical exam, and functional history, the patient has potential for functional progress which will result in measurable gains while on inpatient rehab.  These gains will be of substantial and practical use upon discharge  in facilitating mobility and self-care at the household level. 5. Physiatrist will provide 24 hour management of medical needs as well as oversight of the therapy plan/treatment and provide guidance as appropriate regarding the interaction of the two. 6. The  Preadmission Screening has been reviewed and patient status is unchanged unless otherwise stated above. 7. 24 hour rehab nursing will assist with bladder management, bowel management, safety, skin/wound care, disease management, medication administration, pain management and patient education  and help integrate therapy concepts, techniques,education, etc. 8. PT will assess and treat for/with: Lower extremity strength, range of motion, stamina, balance, functional mobility, safety, adaptive techniques and equipment, vestibular rx, NMR.   Goals are: mod I. 9. OT will assess and treat for/with: ADL's, functional mobility, safety, upper extremity strength, adaptive techniques and equipment, NMR, vestibular rx, community reentry.   Goals are: mod I. Therapy may proceed with showering this patient. 10. SLP will assess and treat for/with: n/a.  Goals are: n/a. 11. Case Management and Social Worker will assess and treat for psychological issues and discharge planning. 12. Team conference will be held weekly to assess progress toward goals and to determine barriers to discharge. 13. Patient will receive at least 3 hours of therapy per day at least 5 days per week. 14. ELOS: 5-7 days       15. Prognosis:  excellent     Meredith Staggers, MD, [facility] 12/23/2017  Lavon Paganini [place], PA-C 12/22/2017

## 2017-12-22 NOTE — Progress Notes (Signed)
Pt refused CPAP for the night. RT will continue to monitor as needed.  

## 2017-12-22 NOTE — Progress Notes (Signed)
Patient arrived to 3M13 oriented to surroundings.  Complete body assessment done.  Oxygen 2 LPM via nasal canula applied. Patient complains of headache located in occipital region and left eye.  Heat pack applied to areas.  Rest and relaxation encouraged.  Will provide pain medication as well.  This nurse will continue to monitor patient. Safety and comfort measures maintained at this time.

## 2017-12-22 NOTE — Progress Notes (Signed)
Hospitalist progress note   Nicole [name]  QKM:[id number] DOB: 1966/01/28 DOA: 12/18/2017 PCP: System, Provider Not In   Specialists:   Brief Narrative:  52 year old asthma, morbid obesity, chronic intractable migraine, HTN, HLD admitted on 12/19/2017 and CT head showed large left PICA infarct with edema and inferior tonsillar herniation CTA confirmed left PICA severe stenosis of right patient was admitted by neurology and neurosurgery was aware of the patient CCM was consulted to manage giving hypertonic saline and patient was kept on the same and started on this Transferred to [facility] 2[facility]7  Assessment & Plan:   Assessment:  The primary encounter diagnosis was Other migraine without status migrainosus, not intractable. Diagnoses of Nausea vomiting and diarrhea, Encounter for central line placement, and New cerebellar infarct [facility]) were also pertinent to this visit.  Large left posterior inferior cerebellar infarct with mass edema and early obstructive hydrocephalus-defer to neurology further management sodium is 133 and looks like goal is 150-placed on aspirin by neurology-patient can trasnfer to SDU as now off hypertonic saline-RPt CT head am and if stable can d/c? HTN blood pressure goal below 180 restarted on home meds on 2/4 History of OSA/CPAP placed on CPAP at night Diabetes mellitus with A1c 7-no med s PTA-will start metformin 500 bid and observe Morbid obesity BMI 52 Chronic intractable migraines    DVT prophylaxis: scd-risk hemorrhagic conversion  Code Status:   full   Family Communication:    freidn bedsdie  Disposition Plan:  inpatient   Consultants:   Neuro   ccm  Procedures:   none  Antimicrobials:    none   Subjective:  awake aelrt headaches still only bette rkeeping stillno focal anomaly Otherwise well  Objective: Vitals:   12/22/17 0900 12/22/17 0925 12/22/17 1000 12/22/17 1100  BP:  (!) 149/92  (!) 133/92  Pulse: 89 81 89 79  Resp:      Temp:       TempSrc:      SpO2: 95% 95% 95% 98%  Weight:      Height:        Intake/Output Summary (Last 24 hours) at 12/22/2017 1205 Last data filed at 12/22/2017 1100 Gross per 24 hour  Intake 1309.38 ml  Output -  Net 1309.38 ml   Filed Weights   12/20/17 0403 12/21/17 0500 12/22/17 0500  Weight: 135.2 kg (298 lb 1 oz) (!) 136.4 kg (300 lb 11.3 oz) 134.1 kg (295 lb 10.2 oz)    Examination: Awake alert no pallor no ict, EOMI ncat s1 s2 no mrg Chest clear without rales nor rhonchi, trachea midline No SM lymphadenopathy, no ymphadenopathy in axialla abd soft nt nd no rebound no guard Neuro intact moving 4 limbs equally-no deficit and power 5/5, reflexes not tested sensory wnl Skin no rash no redness    Data Reviewed: I have personally reviewed following labs and imaging studies  CBC: Recent Labs  Lab 12/18/17 1416 12/20/17 0245 12/21/17 0354  WBC 12.7* 9.2 10.2  NEUTROABS 10.5*  --   --   HGB 12.4 10.2* 9.7*  HCT 39.5 33.9* 32.9*  MCV 80.3 81.9 81.8  PLT 212 174 196   Basic Metabolic Panel: Recent Labs  Lab 12/18/17 1416  12/19/17 1115  12/20/17 0245  12/21/17 0354 12/21/17 1102 12/21/17 1443 12/22/17 0011 12/22/17 0347 12/22/17 0952  NA 131*   < > 145   < > 142  142   < > 137 138 138 136 136 133*  K 3.5  --  3.4*  --  3.4*  --  3.1*  --   --   --  3.7  --   CL 97*  --  112*  --  107  --  102  --   --   --  95*  --   CO2 21*  --  23  --  24  --  25  --   --   --  27  --   GLUCOSE 137*  --  100*  --  100*  --  108*  --   --   --  67  --   BUN 9  --  11  --  7  --  7  --   --   --  8  --   CREATININE 0.65  --  0.62  --  0.63  --  0.57  --   --   --  0.56  --   CALCIUM 9.2  --  8.1*  --  8.5*  --  8.4*  --   --   --  8.6*  --   MG  --   --  2.1  --   --   --   --   --   --   --   --   --    < > = values in this interval not displayed.   GFR: Estimated Creatinine Clearance: 114.3 mL/min (by C-G formula based on SCr of 0.56 mg/dL). Liver Function Tests: Recent Labs   Lab 12/18/17 1416  AST 22  ALT 16  ALKPHOS 105  BILITOT 0.9  PROT 8.6*  ALBUMIN 3.9   No results for input(s): LIPASE, AMYLASE in the last 168 hours. No results for input(s): AMMONIA in the last 168 hours. Coagulation Profile: Recent Labs  Lab 12/19/17 0447  INR 1.14   Cardiac Enzymes: No results for input(s): CKTOTAL, CKMB, CKMBINDEX, TROPONINI in the last 168 hours. CBG: No results for input(s): GLUCAP in the last 168 hours. Urine analysis:    Component Value Date/Time   COLORURINE AMBER (A) 12/18/2017 1455   APPEARANCEUR CLOUDY (A) 12/18/2017 1455   LABSPEC 1.028 12/18/2017 1455   PHURINE 6.0 12/18/2017 1455   GLUCOSEU NEGATIVE 12/18/2017 1455   HGBUR SMALL (A) 12/18/2017 1455   BILIRUBINUR NEGATIVE 12/18/2017 1455   KETONESUR NEGATIVE 12/18/2017 1455   PROTEINUR 100 (A) 12/18/2017 1455   UROBILINOGEN 1.0 06/26/2015 1708   NITRITE NEGATIVE 12/18/2017 1455   LEUKOCYTESUR NEGATIVE 12/18/2017 1455     Radiology Studies: Reviewed images personally in health database    Scheduled Meds: . aspirin EC  81 mg Oral Daily  . atorvastatin  80 mg Oral Daily  . Chlorhexidine Gluconate Cloth  6 each Topical Daily  . diltiazem  120 mg Oral Q6H  . FLUoxetine  40 mg Oral BID  . furosemide  20 mg Oral BID  . hydrochlorothiazide  25 mg Oral Daily  . mouth rinse  15 mL Mouth Rinse BID  . metoprolol succinate  25 mg Oral Daily  . pantoprazole (PROTONIX) IV  40 mg Intravenous QHS  . sodium chloride flush  10-40 mL Intracatheter Q12H   Continuous Infusions: . sodium chloride    . sodium chloride (hypertonic) 18.75 mL/hr at 12/22/17 0700     LOS: 3 days    Time spent: 20    Pleas Koch, MD [facility] ([facility]   If 7PM-7AM, please contact night-coverage [email or web address] Password [facility] 12/22/2017, 12:05 PM

## 2017-12-22 NOTE — Progress Notes (Signed)
Physical Therapy Treatment Patient Details Name: Nicole [name] MRN: [id number] DOB: [birth year] Today's Date: 12/22/2017    History of Present Illness 52 y.o. female admitted on 12/18/17 for dizziness and HA with resultant N/V/D.  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  Pt with significant PMH of low back pain with sciatica, migraines, HTN, asthma, and depression.    PT Comments    Pt is progressing well with gait and mobility.  She is very slow to move and very guarded and unsteady on her feet.  We continue to use compensatory strategies of targeting and segmental turning and pt was given HEP of x1 seated horizontal and vertical head shaking exercises for habituation.  She remains appropriate and motivated for inpatient rehab.   Follow Up Recommendations  CIR     Equipment Recommendations  None recommended by PT    Recommendations for Other Services   NA     Precautions / Restrictions Precautions Precautions: Fall    Mobility  Bed Mobility               General bed mobility comments: Pt was OOB in the recliner chair.  Transfers Overall transfer level: Needs assistance Equipment used: 2 person hand held assist Transfers: Sit to/from Stand Sit to Stand: +2 physical assistance;Min assist         General transfer comment: Two person hand held assist once standing for balance.  Min assist for transfer to stand, again for balance more than to power up. Pt with (+) sway initially while standing.   Ambulation/Gait Ambulation/Gait assistance: +2 physical assistance;Min assist Ambulation Distance (Feet): 55 Feet Assistive device: 2 person hand held assist Gait Pattern/deviations: Step-through pattern;Staggering left;Staggering right Gait velocity: decreased Gait velocity interpretation: <1.8 ft/sec, indicative of risk for recurrent falls General Gait Details: Two person min hand held  assist for balance.  Working on targeting to help with dizziness and gaze stability.  Segmental turning compensatory strategy used for turns in both directions.  Pt took 3 standing rest breaks to close her eyes before continuing.        Modified Rankin (Stroke Patients Only) Modified Rankin (Stroke Patients Only) Pre-Morbid Rankin Score: No symptoms Modified Rankin: Moderately severe disability     Balance Overall balance assessment: Needs assistance Sitting-balance support: Feet supported;No upper extremity supported Sitting balance-Leahy Scale: Good       Standing balance-Leahy Scale: Poor Standing balance comment: needs min assist for balance in standing.  Static standing with (+) sway.                             Cognition Arousal/Alertness: Awake/alert Behavior During Therapy: Anxious(emotionally tearful) Overall Cognitive Status: Within Functional Limits for tasks assessed                                 General Comments: Pt continues to get tearful throughout our sessions.        Exercises Other Exercises Other Exercises: x1 seated vertical and horizontal exercises given.  Pt is light sensative, so soft overhead light used.  Pt reports she has already been practicing looking around the room and turning her head while in bed.      General Comments        Pertinent Vitals/Pain Pain Assessment: 0-10 Pain Score: 8  Pain Location: headache  Pain Descriptors / Indicators: Aching Pain Intervention(s): Limited activity within patient's tolerance;Monitored during session;Repositioned           PT Goals (current goals can now be found in the care plan section) Acute Rehab PT Goals Patient Stated Goal: Pt wants to get back to normal decrease HA Progress towards PT goals: Progressing toward goals    Frequency    Min 4X/week      PT Plan Current plan remains appropriate       AM-PAC PT "6 Clicks" Daily Activity  Outcome Measure   Difficulty turning over in bed (including adjusting bedclothes, sheets and blankets)?: None Difficulty moving from lying on back to sitting on the side of the bed? : None Difficulty sitting down on and standing up from a chair with arms (e.g., wheelchair, bedside commode, etc,.)?: Unable Help needed moving to and from a bed to chair (including a wheelchair)?: A Little Help needed walking in hospital room?: A Little Help needed climbing 3-5 steps with a railing? : A Little 6 Click Score: 18    End of Session   Activity Tolerance: Patient limited by pain;Patient limited by fatigue Patient left: in chair;with call bell/phone within reach Nurse Communication: Mobility status PT Visit Diagnosis: Unsteadiness on feet (R26.81);Other symptoms and signs involving the nervous system (R29.898);Dizziness and giddiness (R42)     Time: 0814-4818 PT Time Calculation (min) (ACUTE ONLY): 21 min  Charges:  $Gait Training: 8-22 mins          Taimane Stimmel B. Benito Lemmerman, PT, DPT [phone]            12/22/2017, 10:22 AM

## 2017-12-22 NOTE — Progress Notes (Signed)
Inpatient Rehabilitation  Met with patient and significant other, June at bedside to discuss team's recommendation for IP Rehab.  Shared booklets, insurance verification letter, and answered questions.  I have received insurance authorization.  However, plan to follow for timing of medical readiness and patient's decision given financial concerns.  Will follow up tomorrow.  Call if questions.    Carmelia Roller., CCC/SLP Admission Coordinator  [facility]  Cell [phone]

## 2017-12-23 ENCOUNTER — Inpatient Hospital Stay (HOSPITAL_COMMUNITY)
Admission: RE | Admit: 2017-12-23 | Discharge: 2017-12-28 | DRG: 092 | Disposition: A | Discharge status: Home / Self Care | Source: Intra-hospital | Attending: Physical Medicine & Rehabilitation | Admitting: Physical Medicine & Rehabilitation

## 2017-12-23 ENCOUNTER — Other Ambulatory Visit: Payer: Self-pay

## 2017-12-23 ENCOUNTER — Encounter (HOSPITAL_COMMUNITY): Payer: Self-pay | Admitting: *Deleted

## 2017-12-23 ENCOUNTER — Inpatient Hospital Stay (HOSPITAL_COMMUNITY)

## 2017-12-23 DIAGNOSIS — G441 Vascular headache, not elsewhere classified: Secondary | ICD-10-CM

## 2017-12-23 DIAGNOSIS — F419 Anxiety disorder, unspecified: Secondary | ICD-10-CM | POA: Diagnosis present

## 2017-12-23 DIAGNOSIS — E785 Hyperlipidemia, unspecified: Secondary | ICD-10-CM | POA: Diagnosis present

## 2017-12-23 DIAGNOSIS — I10 Essential (primary) hypertension: Secondary | ICD-10-CM

## 2017-12-23 DIAGNOSIS — G8929 Other chronic pain: Secondary | ICD-10-CM | POA: Diagnosis present

## 2017-12-23 DIAGNOSIS — Z6841 Body Mass Index (BMI) 40.0 and over, adult: Secondary | ICD-10-CM

## 2017-12-23 DIAGNOSIS — R2689 Other abnormalities of gait and mobility: Principal | ICD-10-CM | POA: Diagnosis present

## 2017-12-23 DIAGNOSIS — G911 Obstructive hydrocephalus: Secondary | ICD-10-CM | POA: Diagnosis present

## 2017-12-23 DIAGNOSIS — E871 Hypo-osmolality and hyponatremia: Secondary | ICD-10-CM

## 2017-12-23 DIAGNOSIS — E119 Type 2 diabetes mellitus without complications: Secondary | ICD-10-CM | POA: Diagnosis present

## 2017-12-23 DIAGNOSIS — D62 Acute posthemorrhagic anemia: Secondary | ICD-10-CM | POA: Diagnosis present

## 2017-12-23 DIAGNOSIS — E876 Hypokalemia: Secondary | ICD-10-CM | POA: Diagnosis not present

## 2017-12-23 DIAGNOSIS — Z8673 Personal history of transient ischemic attack (TIA), and cerebral infarction without residual deficits: Secondary | ICD-10-CM

## 2017-12-23 DIAGNOSIS — I69393 Ataxia following cerebral infarction: Secondary | ICD-10-CM

## 2017-12-23 DIAGNOSIS — G936 Cerebral edema: Secondary | ICD-10-CM

## 2017-12-23 DIAGNOSIS — D72829 Elevated white blood cell count, unspecified: Secondary | ICD-10-CM | POA: Diagnosis not present

## 2017-12-23 DIAGNOSIS — G43909 Migraine, unspecified, not intractable, without status migrainosus: Secondary | ICD-10-CM | POA: Diagnosis present

## 2017-12-23 LAB — SODIUM
SODIUM: 135 mmol/L (ref 135–145)
SODIUM: 134 mmol/L — AB (ref 135–145)
Sodium: 134 mmol/L — ABNORMAL LOW (ref 135–145)

## 2017-12-23 MED ORDER — SORBITOL 70 % SOLN
30.0000 mL | Freq: Every day | Status: DC | PRN
Start: 2017-12-23 — End: 2017-12-28
  Administered 2017-12-26: 30 mL via ORAL
  Filled 2017-12-23: qty 30

## 2017-12-23 MED ORDER — BUTALBITAL-APAP-CAFFEINE 50-325-40 MG PO TABS
1.0000 | ORAL_TABLET | Freq: Four times a day (QID) | ORAL | Status: DC | PRN
  Administered 2017-12-24 – 2017-12-27 (×5): 1 via ORAL
  Filled 2017-12-23 (×6): qty 1

## 2017-12-23 MED ORDER — PANTOPRAZOLE SODIUM 40 MG IV SOLR: 40.0000 mg | Freq: Every day | INTRAVENOUS | Status: DC

## 2017-12-23 MED ORDER — FUROSEMIDE 20 MG PO TABS
20.0000 mg | ORAL_TABLET | Freq: Two times a day (BID) | ORAL | Status: DC
  Administered 2017-12-23 – 2017-12-25 (×5): 20 mg via ORAL
  Filled 2017-12-23 (×5): qty 1

## 2017-12-23 MED ORDER — PANTOPRAZOLE SODIUM 40 MG PO TBEC
40.0000 mg | DELAYED_RELEASE_TABLET | Freq: Every day | ORAL | Status: DC
  Administered 2017-12-23 – 2017-12-27 (×5): 40 mg via ORAL
  Filled 2017-12-23 (×6): qty 1

## 2017-12-23 MED ORDER — ATORVASTATIN CALCIUM 80 MG PO TABS
80.0000 mg | ORAL_TABLET | Freq: Every day | ORAL | Status: DC
  Administered 2017-12-24 – 2017-12-28 (×5): 80 mg via ORAL
  Filled 2017-12-23 (×5): qty 1

## 2017-12-23 MED ORDER — FLUOXETINE HCL 20 MG PO CAPS
40.0000 mg | ORAL_CAPSULE | Freq: Two times a day (BID) | ORAL | Status: DC
  Administered 2017-12-23 – 2017-12-28 (×10): 40 mg via ORAL
  Filled 2017-12-23 (×12): qty 2

## 2017-12-23 MED ORDER — METOPROLOL SUCCINATE ER 25 MG PO TB24
25.0000 mg | ORAL_TABLET | Freq: Every day | ORAL | Status: DC
  Administered 2017-12-24 – 2017-12-28 (×5): 25 mg via ORAL
  Filled 2017-12-23 (×5): qty 1

## 2017-12-23 MED ORDER — ONDANSETRON HCL 4 MG PO TABS
4.0000 mg | ORAL_TABLET | Freq: Four times a day (QID) | ORAL | Status: DC | PRN
  Administered 2017-12-24: 4 mg via ORAL
  Filled 2017-12-23: qty 1

## 2017-12-23 MED ORDER — DILTIAZEM HCL 60 MG PO TABS
120.0000 mg | ORAL_TABLET | Freq: Four times a day (QID) | ORAL | Status: DC
  Administered 2017-12-23 – 2017-12-28 (×19): 120 mg via ORAL
  Filled 2017-12-23 (×20): qty 2

## 2017-12-23 MED ORDER — LIDOCAINE 5 % EX PTCH: 1.0000 | MEDICATED_PATCH | Freq: Every day | CUTANEOUS | Status: DC | PRN

## 2017-12-23 MED ORDER — ONDANSETRON HCL 4 MG/2ML IJ SOLN: 4.0000 mg | Freq: Four times a day (QID) | INTRAMUSCULAR | Status: DC | PRN

## 2017-12-23 MED ORDER — ASPIRIN EC 81 MG PO TBEC
81.0000 mg | DELAYED_RELEASE_TABLET | Freq: Every day | ORAL | Status: DC
  Administered 2017-12-24 – 2017-12-28 (×5): 81 mg via ORAL
  Filled 2017-12-23 (×5): qty 1

## 2017-12-23 MED ORDER — CHLORHEXIDINE GLUCONATE 0.12 % MT SOLN
OROMUCOSAL | Status: AC
  Filled 2017-12-23: qty 15

## 2017-12-23 MED ORDER — TRAMADOL HCL 50 MG PO TABS
100.0000 mg | ORAL_TABLET | Freq: Four times a day (QID) | ORAL | Status: DC | PRN
  Administered 2017-12-24 – 2017-12-25 (×2): 100 mg via ORAL
  Filled 2017-12-23 (×3): qty 2

## 2017-12-23 MED ORDER — ACETAMINOPHEN 325 MG PO TABS: 650.0000 mg | ORAL_TABLET | ORAL | Status: DC | PRN

## 2017-12-23 MED ORDER — METFORMIN HCL 500 MG PO TABS: 500.0000 mg | ORAL_TABLET | Freq: Two times a day (BID) | ORAL | 11 refills | Status: DC

## 2017-12-23 MED ORDER — TRAMADOL HCL 50 MG PO TABS: 100.0000 mg | ORAL_TABLET | Freq: Four times a day (QID) | ORAL | Status: DC | PRN

## 2017-12-23 MED ORDER — HYDROCHLOROTHIAZIDE 25 MG PO TABS
25.0000 mg | ORAL_TABLET | Freq: Every day | ORAL | Status: DC
  Administered 2017-12-24 – 2017-12-28 (×5): 25 mg via ORAL
  Filled 2017-12-23 (×5): qty 1

## 2017-12-23 NOTE — Care Management Note (Signed)
Case Management Note  Patient Details  Name: Ticey Cottle MRN: [id number] Date of Birth: [birth year]  Subjective/Objective:   Dc to CIR today.                 Action/Plan: DC to CIR today.  Expected Discharge Date:  12/23/17               Expected Discharge Plan:  IP Rehab Facility  In-House Referral:     Discharge planning Services  CM Consult  Post Acute Care Choice:    Choice offered to:     DME Arranged:    DME Agency:     HH Arranged:    HH Agency:     Status of Service:  Completed, signed off  If discussed at [employer], dates discussed:    Additional Comments:  Leone Haven, RN 12/23/2017, 4:09 PM

## 2017-12-23 NOTE — Progress Notes (Signed)
PMR Admission Coordinator Pre-Admission Assessment  Patient: Nicole [name] is an 52 y.o., female MRN: [id number] DOB: 08/23/1966 Height: _0  (160 cm) Weight: (!) 137.4 kg (302 lb 14.6 oz)                                                                                                                                                  Insurance Information HMO:     PPO:      PCP:      IPA:      80/20:      OTHER: Brewing technologist PRIMARY: [facility]      Policy#: [id number]      Subscriber: Nicole [name] CM Name: Avel Peace      Phone#: [phone]     Fax#: [phone] or email: [email or web address] Attach via update button Pre-Cert#: [id number]  For 14 days called 12/23/17 to confirm admission. Upon admission submit MD initial admission, Evals, treatment plans, progress notes, ELOS, and DC plans.    Employer: Spouse  Benefits:  Phone #: Verified online     Name: [email or web address] online Eff. Date:      Deduct: $150      Out of Pocket Max: $3000      Life Max: N/A CIR: $250 a day up until Max/Cap is met      SNF: $250 a day up until MAx/Cap is met Outpatient: Necessity      Co-Pay: $41 per visit Home Health: Necessity, 100       Co-Pay: $0 DME: 80%     Co-Pay: 20% Providers: In-network  SECONDARY: None        Medicaid Application Date:       Case Manager:  Disability Application Date:       Case Worker:   Emergency Contact Information        Contact Information    Name Relation Home Work Mobile   [name],Nicole Significant other [phone]     Nicole [name] Daughter   [phone]     Current Medical History  Patient Admitting Diagnosis: Left PICA infarct with truncal ataxia  History of Present Illness: Nicole [name] a 52 y.o.right handed femalewith history of migraine headaches, obesity, hyperlipidemia, hypertension and chronic low back painmaintained on Lidoderm patch. Per chart review patient lives with female significant other Nicole. Independent prior  to admission. One level home with 5 steps to entry. Significant other is an Mining engineer and has flexibility with work schedule. Presented 12/19/2017 with persistent dizziness, fall as well as nausea vomiting. CT of the head showed acute infarcts in the left more than right cerebellum with posterior fossa mass effect and foramen magnum stenosis. CTA of head with no large vessel occlusion or stenosis. Patient did not receive TPA. MRI confirmed left PICA distribution acute early subacute infarct as well as multiple small infarcts superior cerebellar hemispheres bilaterally with edema  and local mass effect resulting in effacement of fourth ventricle. 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 No new large vessel occlusion.Neurosurgery Dr. Sherley Bounds consulted for review of CTA no surgical intervention at this time. Patient was placed on 3% saline, which has been discontinued.Latest follow-up cranial CT scan 12/23/2017 showed continued internal evolution of left larger than right bilateral cerebellar infarcts no evidence for hemorrhagic transformation. Noted associated mild obstructive hydrocephalus slightly improved from previous exam.Echocardiogram with ejection fraction of 23% grade 1 diastolic dysfunction. Neurology consulted presently on aspirin for CVA prophylaxis. Tolerating a regular texture diet. Bouts of hypokalemia with supplement added. Physical and occupational therapy evaluations completed with recommendations of physical medicine rehabilitation consult.Patient was admitted for a comprehensive rehabilitation program 12/23/17  NIH Total: 0  Past Medical History      Past Medical History:  Diagnosis Date  . Asthma   . Depression   . Hyperlipemia   . Hypertension   . Migraines   . Obesity   . Sciatica     Family History  family history is not on file.  Prior  Rehab/Hospitalizations:  Has the patient had major surgery during 100 days prior to admission? Yes  Current Medications   Current Facility-Administered Medications:  .  0.9 %  sodium chloride infusion, 250 mL, Intravenous, PRN, Scatliffe, Kristen D, MD .  acetaminophen (TYLENOL) tablet 650 mg, 650 mg, Oral, Q4H PRN, Amie Portland, MD, 650 mg at 12/22/17 0753 .  aspirin EC tablet 81 mg, 81 mg, Oral, Daily, Costello, Mary A, NP, 81 mg at 12/23/17 0934 .  atorvastatin (LIPITOR) tablet 80 mg, 80 mg, Oral, Daily, Costello, Mary A, NP, 80 mg at 12/23/17 0934 .  butalbital-acetaminophen-caffeine (FIORICET, ESGIC) 50-325-40 MG per tablet 1 tablet, 1 tablet, Oral, Q6H PRN, Costello, Mary A, NP, 1 tablet at 12/22/17 1009 .  chlorhexidine (PERIDEX) 0.12 % solution, , , ,  .  Chlorhexidine Gluconate Cloth 2 % PADS 6 each, 6 each, Topical, Daily, Garvin Fila, MD, 6 each at 12/23/17 1014 .  diltiazem (CARDIZEM) tablet 120 mg, 120 mg, Oral, Q6H, Costello, Mary A, NP, 120 mg at 12/23/17 1217 .  FLUoxetine (PROZAC) capsule 40 mg, 40 mg, Oral, BID, Costello, Mary A, NP, 40 mg at 12/23/17 0934 .  furosemide (LASIX) tablet 20 mg, 20 mg, Oral, BID, Costello, Mary A, NP, 20 mg at 12/23/17 0934 .  hydrochlorothiazide (HYDRODIURIL) tablet 25 mg, 25 mg, Oral, Daily, Costello, Mary A, NP, 25 mg at 12/23/17 0934 .  labetalol (NORMODYNE,TRANDATE) injection 5 mg, 5 mg, Intravenous, Q2H PRN, Costello, Mary A, NP, 5 mg at 12/19/17 2152 .  lidocaine (LIDODERM) 5 % 1 patch, 1 patch, Transdermal, Daily PRN, Costello, Mary A, NP .  MEDLINE mouth rinse, 15 mL, Mouth Rinse, BID, Garvin Fila, MD, 15 mL at 12/23/17 0940 .  metoprolol succinate (TOPROL-XL) 24 hr tablet 25 mg, 25 mg, Oral, Daily, Costello, Mary A, NP, 25 mg at 12/23/17 0934 .  ondansetron (ZOFRAN) injection 4 mg, 4 mg, Intravenous, Q6H PRN, Scatliffe, Kristen D, MD, 4 mg at 12/20/17 1917 .  pantoprazole (PROTONIX) injection 40 mg, 40 mg, Intravenous, QHS,  Scatliffe, Kristen D, MD, 40 mg at 12/22/17 2121 .  sodium chloride flush (NS) 0.9 % injection 10-40 mL, 10-40 mL, Intracatheter, Q12H, Garvin Fila, MD, 10 mL at 12/23/17 1015 .  sodium chloride flush (NS) 0.9 % injection 10-40 mL, 10-40 mL, Intracatheter, PRN, Leonie Man,  Lucy Antigua, MD .  traMADol Veatrice Bourbon) tablet 100 mg, 100 mg, Oral, Q6H PRN, Garvin Fila, MD, 100 mg at 12/23/17 1224  Patients Current Diet: Diet heart healthy/carb modified Room service appropriate? Yes; Fluid consistency: Thin Diet - low sodium heart healthy  Precautions / Restrictions Precautions Precautions: Fall Restrictions Weight Bearing Restrictions: No   Has the patient had 2 or more falls or a fall with injury in the past year?No  Prior Activity Level Community (5-7x/wk): Prior to admission patient was fully independent with self-care and home management.  She lives with female significant other, Nicole who helps wtih medication management and appointments.  She has 5 daughters and 13 grandchildren that she enjoys spending time with.  She also enjoys crafts and running errands.   Home Assistive Devices / Equipment Home Assistive Devices/Equipment: None Home Equipment: None(was looking at getting a rollator due to chronic back pain)  Prior Device Use: Indicate devices/aids used by the patient prior to current illness, exacerbation or injury? None of the above  Prior Functional Level Prior Function Level of Independence: Independent Comments: Pt reports she has back pain that limits her at times, and she was considering the purchase of a rollator.  Limited driving, does not work, does household chores, cooking.   Self Care: Did the patient need help bathing, dressing, using the toilet or eating? Independent  Indoor Mobility: Did the patient need assistance with walking from room to room (with or without device)? Independent  Stairs: Did the patient need assistance with internal or external stairs  (with or without device)? Independent  Functional Cognition: Did the patient need help planning regular tasks such as shopping or remembering to take medications? Needed some help  Current Functional Level Cognition  Overall Cognitive Status: Within Functional Limits for tasks assessed Orientation Level: Oriented X4 General Comments: pt giggling throughout session.    Extremity Assessment (includes Sensation/Coordination)  Upper Extremity Assessment: Overall WFL for tasks assessed(limited eval due to severe HA )  Lower Extremity Assessment: Defer to PT evaluation    ADLs  Overall ADL's : Needs assistance/impaired Eating/Feeding: Modified independent, Bed level Grooming: Wash/dry hands, Wash/dry face, Oral care, Brushing hair, Minimal assistance, Sitting Upper Body Bathing: Moderate assistance, Sitting Lower Body Bathing: Maximal assistance, Sit to/from stand Upper Body Dressing : Minimal assistance, Sitting Lower Body Dressing: Total assistance, Sit to/from stand Toilet Transfer: Minimal assistance, Ambulation, RW Toileting- Clothing Manipulation and Hygiene: Total assistance, Sit to/from stand Functional mobility during ADLs: Minimal assistance, Rolling walker General ADL Comments: pt using visual targets for gaze stabilization during ambulation, cued to sit EOB momentarily before standing    Mobility  Overal bed mobility: Needs Assistance Bed Mobility: Supine to Sit Supine to sit: Supervision, HOB elevated Sit to supine: Supervision General bed mobility comments: no assist, supervision for safety    Transfers  Overall transfer level: Needs assistance Equipment used: Rolling walker (2 wheeled) Transfers: Sit to/from Stand Sit to Stand: Min guard Stand pivot transfers: +2 physical assistance, Min assist General transfer comment: min guard with and without walker    Ambulation / Gait / Stairs / Wheelchair Mobility  Ambulation/Gait Ambulation/Gait assistance:  Min guard, +2 safety/equipment(second person for chair follow) Ambulation Distance (Feet): 200 Feet Assistive device: Rolling walker (2 wheeled), None Gait Pattern/deviations: Step-through pattern, Decreased stride length General Gait Details: pt ambulated in hallway with use of RW and close min guard for safety; pt using visual gaze strategies throughout; pt also able to ambulate ~8' forwards and ~8' backwards without  use of an AD with close min guard Gait velocity: decreased Gait velocity interpretation: Below normal speed for age/gender    Posture / Balance Balance Overall balance assessment: Needs assistance Sitting-balance support: Feet supported, No upper extremity supported Sitting balance-Leahy Scale: Good Standing balance support: No upper extremity supported Standing balance-Leahy Scale: Fair Standing balance comment: without UB support    Special needs/care consideration BiPAP/CPAP: Yes, CPAP at night  CPM: No Continuous Drip IV: No Dialysis: No         Life Vest: No Oxygen: No Special Bed: No Trach Size: No Wound Vac (area): No       Skin: Dry                               Bowel mgmt: Continent, last BM 12/21/17 Bladder mgmt: Continent  Diabetic mgmt: HgbA1c 6.3 with goal of < 7.0       Previous Home Environment Living Arrangements: Spouse/significant other(significant other is an Mining engineer and has flexibility) Available Help at Discharge: Family, Available 24 hours/day Type of Home: Apartment Home Layout: One level Home Access: Stairs to enter Entrance Stairs-Rails: Right Entrance Stairs-Number of Steps: 5 Bathroom Shower/Tub: Tub/shower unit, Architectural technologist: Standard Home Care Services: No  Discharge Living Setting Plans for Discharge Living Setting: Lives with (comment)(Female Significant other, Nicole) Type of Home at Discharge: Apartment Discharge Home Layout: One level Discharge Home Access: Stairs to enter Entrance Stairs-Rails:  None Entrance Stairs-Number of Steps: 4-5 Discharge Bathroom Shower/Tub: Tub/shower unit, Curtain Discharge Bathroom Toilet: Standard Discharge Bathroom Accessibility: Yes How Accessible: Accessible via walker Does the patient have any problems obtaining your medications?: No  Social/Family/Support Systems Patient Roles: Partner, Parent, Other (Comment)(Grandparent ) Contact Information: Female significant other, Nicole  Anticipated Caregiver: Nicole Anticipated Caregiver's Contact Information: [phone] Ability/Limitations of Caregiver: None Caregiver Availability: 24/7 Discharge Plan Discussed with Primary Caregiver: Yes Is Caregiver In Agreement with Plan?: Yes Does Caregiver/Family have Issues with Lodging/Transportation while Pt is in Rehab?: No  Goals/Additional Needs Patient/Family Goal for Rehab: PT/OT: Mod I Expected length of stay: 5-7 days Cultural Considerations: None Dietary Needs: Heart Healthy, Carb. Mod. diet restrictions  Equipment Needs: TBD Special Service Needs: None Additional Information: Patient still married to Gus Shifrin she reports that they are good friends [phone] Pt/Family Agrees to Admission and willing to participate: Yes Program Orientation Provided & Reviewed with Pt/Caregiver Including Roles  & Responsibilities: Yes  Decrease burden of Care through IP rehab admission: No  Possible need for SNF placement upon discharge:No  Patient Condition: This patient's medical and functional status has changed since the consult dated: 12/21/17 at 12:43pm in which the Rehabilitation Physician determined and documented that the patient's condition is appropriate for intensive rehabilitative care in an inpatient rehabilitation facility. See "History of Present Illness" (above) for medical update. Functional changes are: Min A +2, 200 feet. Patient's medical and functional status update has been discussed with the Rehabilitation physician and patient remains  appropriate for inpatient rehabilitation. Will admit to inpatient rehab today.  Preadmission Screen Completed By:  Gunnar Fusi, 12/23/2017 1:24 PM ______________________________________________________________________   Discussed status with Dr. Naaman Plummer on 12/23/17 at 64 and received telephone approval for admission today.  Admission Coordinator:  Gunnar Fusi, time 1320/Date 12/23/17             Cosigned by: Meredith Staggers, MD at 12/23/2017 1:47 PM  Revision History

## 2017-12-23 NOTE — Progress Notes (Signed)
Physical Therapy Treatment Patient Details Name: Nicole [name] MRN: [id number] DOB: 1966/05/19 Today's Date: 12/23/2017    History of Present Illness Pt is a 52 y.o. female admitted on 12/18/17 for dizziness and HA with resultant N/V/D.  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  Pt with significant PMH of low back pain with sciatica, migraines, HTN, asthma, and depression.    PT Comments    Pt seen for mobility progression and making excellent progress with functional mobility. All VSSt throughout. Pt would continue to benefit from skilled physical therapy services at this time while admitted and after d/c to address the below listed limitations in order to improve overall safety and independence with functional mobility.    Follow Up Recommendations  CIR     Equipment Recommendations  None recommended by PT    Recommendations for Other Services       Precautions / Restrictions Precautions Precautions: Fall Restrictions Weight Bearing Restrictions: No    Mobility  Bed Mobility Overal bed mobility: Needs Assistance Bed Mobility: Supine to Sit     Supine to sit: Supervision;HOB elevated Sit to supine: Supervision   General bed mobility comments: no assist, supervision for safety  Transfers Overall transfer level: Needs assistance Equipment used: Rolling walker (2 wheeled) Transfers: Sit to/from Stand Sit to Stand: Min guard         General transfer comment: min guard with and without walker  Ambulation/Gait Ambulation/Gait assistance: Min guard;+2 safety/equipment(second person for chair follow) Ambulation Distance (Feet): 200 Feet Assistive device: Rolling walker (2 wheeled);None Gait Pattern/deviations: Step-through pattern;Decreased stride length Gait velocity: decreased Gait velocity interpretation: Below normal speed for age/gender General Gait Details: pt ambulated in  hallway with use of RW and close min guard for safety; pt using visual gaze strategies throughout; pt also able to ambulate ~8' forwards and ~8' backwards without use of an AD with close min guard   Stairs            Wheelchair Mobility    Modified Rankin (Stroke Patients Only) Modified Rankin (Stroke Patients Only) Pre-Morbid Rankin Score: No symptoms Modified Rankin: Moderately severe disability     Balance Overall balance assessment: Needs assistance Sitting-balance support: Feet supported;No upper extremity supported Sitting balance-Leahy Scale: Good     Standing balance support: No upper extremity supported Standing balance-Leahy Scale: Fair Standing balance comment: without UB support                            Cognition Arousal/Alertness: Awake/alert Behavior During Therapy: WFL for tasks assessed/performed Overall Cognitive Status: Within Functional Limits for tasks assessed                                 General Comments: pt giggling throughout session.      Exercises      General Comments        Pertinent Vitals/Pain Pain Assessment: Faces Faces Pain Scale: Hurts little more Pain Location: headache Pain Descriptors / Indicators: Headache Pain Intervention(s): Monitored during session;Repositioned    Home Living                      Prior Function            PT Goals (current goals can now be found in the care plan section) Acute  Rehab PT Goals Patient Stated Goal: Pt wants to get back to normal decrease HA PT Goal Formulation: With patient Time For Goal Achievement: 01/03/18 Potential to Achieve Goals: Good Progress towards PT goals: Progressing toward goals    Frequency    Min 4X/week      PT Plan Current plan remains appropriate    Co-evaluation PT/OT/SLP Co-Evaluation/Treatment: Yes Reason for Co-Treatment: For patient/therapist safety;To address functional/ADL transfers PT goals addressed  during session: Mobility/safety with mobility;Balance;Proper use of DME;Strengthening/ROM OT goals addressed during session: Proper use of Adaptive equipment and DME      AM-PAC PT "6 Clicks" Daily Activity  Outcome Measure  Difficulty turning over in bed (including adjusting bedclothes, sheets and blankets)?: None Difficulty moving from lying on back to sitting on the side of the bed? : None Difficulty sitting down on and standing up from a chair with arms (e.g., wheelchair, bedside commode, etc,.)?: Unable Help needed moving to and from a bed to chair (including a wheelchair)?: A Little Help needed walking in hospital room?: A Little Help needed climbing 3-5 steps with a railing? : A Little 6 Click Score: 18    End of Session Equipment Utilized During Treatment: Gait belt Activity Tolerance: Patient tolerated treatment well Patient left: in chair;with call bell/phone within reach;Other (comment)(CIR admissions coordinator in room) Nurse Communication: Mobility status PT Visit Diagnosis: Unsteadiness on feet (R26.81);Other symptoms and signs involving the nervous system (R29.898);Dizziness and giddiness (R42)     Time: 9604-5409 PT Time Calculation (min) (ACUTE ONLY): 22 min  Charges:  $Gait Training: 8-22 mins                    G Codes:       [place], [place], [place] [phone]    Alessandra Bevels Mohogany Toppins 12/23/2017, 12:25 PM

## 2017-12-23 NOTE — Progress Notes (Signed)
NEUROHOSPITALISTS STROKE TEAM - DAILY PROGRESS NOTE      SUBJECTIVE (INTERVAL HISTORY) Her  Therapists are at the bedside. Patient is ambulating with assistance. No new events reported overnight. Patient notes improvement in her  H/A and dizziness.  Her serum sodium remains suboptimal at 135  and Hypertonic saline drip has been  discontinued y`day afternoon. Plan to transfer to inpatient rehabilitation unit if bed available. Repeat CT scan of the head this morning shows slight improvement in cytotoxic edema and hydrocephalus   OBJECTIVE Lab Results: CBC:  Recent Labs  Lab 12/18/17 1416 12/20/17 0245 12/21/17 0354  WBC 12.7* 9.2 10.2  HGB 12.4 10.2* 9.7*  HCT 39.5 33.9* 32.9*  MCV 80.3 81.9 81.8  PLT 212 174 196   BMP: Recent Labs  Lab 12/18/17 1416  12/19/17 1115  12/20/17 0245  12/21/17 0354  12/22/17 0347 12/22/17 0952 12/22/17 1454 12/22/17 2223 12/23/17 0418 12/23/17 0908  NA 131*   < > 145   < > 142  142   < > 137   < > 136 133* 134* 136 134* 135  K 3.5  --  3.4*  --  3.4*  --  3.1*  --  3.7  --   --   --   --   --   CL 97*  --  112*  --  107  --  102  --  95*  --   --   --   --   --   CO2 21*  --  23  --  24  --  25  --  27  --   --   --   --   --   GLUCOSE 137*  --  100*  --  100*  --  108*  --  67  --   --   --   --   --   BUN 9  --  11  --  7  --  7  --  8  --   --   --   --   --   CREATININE 0.65  --  0.62  --  0.63  --  0.57  --  0.56  --   --   --   --   --   CALCIUM 9.2  --  8.1*  --  8.5*  --  8.4*  --  8.6*  --   --   --   --   --   MG  --   --  2.1  --   --   --   --   --   --   --   --   --   --   --    < > = values in this interval not displayed.   Liver Function Tests:  Recent Labs  Lab 12/18/17 1416  AST 22  ALT 16  ALKPHOS 105  BILITOT 0.9  PROT 8.6*  ALBUMIN 3.9   Coagulation Studies:  No results for input(s): APTT, INR in the last 72 hours. Urinalysis:  Recent Labs  Lab  12/18/17 1455  COLORURINE AMBER*  APPEARANCEUR CLOUDY*  LABSPEC 1.028  PHURINE 6.0  GLUCOSEU NEGATIVE  HGBUR SMALL*  BILIRUBINUR NEGATIVE  KETONESUR NEGATIVE  PROTEINUR 100*  NITRITE NEGATIVE  LEUKOCYTESUR NEGATIVE   PHYSICAL EXAM Temp:  [97.9 F (36.6 C)-99.2 F (37.3 C)] 99.2 F (37.3 C) (02/08 1142) Pulse Rate:  [79-90] 85 (02/08 0934) Resp:  [15-33] 17 (02/08  1610) BP: (96-154)/(66-103) 96/66 (02/08 1217) SpO2:  [83 %-99 %] 97 % (02/08 0837) Weight:  [302 lb 14.6 oz (137.4 kg)] 302 lb 14.6 oz (137.4 kg) (02/08 0500) General - Well nourished, well developed middle aged lady, in no apparent distress HEENT-  Normocephalic,  Cardiovascular - Regular rate and rhythm  Respiratory - Lungs clear bilaterally. No wheezing. Abdomen - soft and non-tender, BS normal Extremities- no edema or cyanosis Neurologic Examination:  Mental Status: Awake and alert. Fully oriented. No dysarthria. Speech fluent with intact comprehension and naming. Able to comprehend all questions accurately. Normal insight, Concentration intact with ability to perform simple addition and accurately recite months of the year in order.  Cranial Nerves: II:  Visual fields intact to left and right. PERRL at 3 mm >> 2 mm.   III,IV, VI: Drift of eyes to right with prominent fast beating horizontal nystagmus to the left;  rotatory component present with upgaze. Able to cross midline to the left. Horzontal nystagmus is present in all directions of gaze, but decreases with downgaze.   V,VII: Smile symmetric, facial temp sensation decreased on the left VIII: hearing intact to voice IX,X: Unable to visualize palate due to severe nausea XI: Symmetric XII: Unable to assess tongue protrusion due to severe nausea Motor: Equal strength in all 4 limbs.  Maximum strength elicited in upper extremities is 4/5 proximally and distally in the context of nausea, vertigo and severe occipital headache. No asymmetry.  Knee extension and  ADF/APF 4+/5 bilateral lower extremities without asymmetry.  Sensory: Temp decreased to LUE. Temp sensation normal to RUE and bilateral lower extremities. FT intact x 4 without extinction.   Deep Tendon Reflexes:   Bilaterally symmetrical and easily elicitable Toes downgoing bilaterally Cerebellar: Dysmetria with slow FNF bilaterally. Heel-shin normal Gait: Not tested  IMAGING: I have personally reviewed the radiological images below and agree with the radiology interpretations. Ct Head Wo Contrast  Result Date: 12/23/2017 CLINICAL DATA:  Follow-up examination for stroke with hydrocephalus. EXAM: CT HEAD WITHOUT CONTRAST TECHNIQUE: Contiguous axial images were obtained from the base of the skull through the vertex without intravenous contrast. COMPARISON:  Prior CT from 12/21/2017. FINDINGS: Brain: Continued interval evolution in bilateral cerebellar infarcts, left larger than right no evidence for hemorrhagic transformation. Associated mass effect within the posterior fossa with secondary partial effacement of the fourth ventricle and quadrigeminal plate cistern, greater on the left. Mild mass effect on the adjacent brainstem again noted. Associated inferior tonsillar herniation noted. Slightly decreased ventricular dilatation of the lateral and third ventricles as compared to previous, most evident at the temporal horns. No new intracranial infarct. No acute intracranial hemorrhage. No mass lesion or midline shift. No extra-axial fluid collection. Vascular: Calcified atherosclerosis at the skull base. No worrisome hyperdense vessel. Skull: Scalp soft tissues and calvarium are unchanged and within normal limits. Sinuses/Orbits: Globes orbital soft tissues within normal limits. Scattered paranasal sinus disease similar to previous. Trace opacity within the right mastoid air cells noted. Other: None. IMPRESSION: 1. Continued interval evolution of left larger than right bilateral cerebellar infarcts with  associated mass effect and inferior tonsillar herniation. Associated mild obstructive hydrocephalus is slightly improved from previous exam. No evidence for hemorrhagic transformation. 2. No other new acute intracranial abnormality. Electronically Signed   By: Rise Mu M.D.   On: 12/23/2017 07:45    Echocardiogram:     Left ventricle: The cavity size was normal. Wall thickness was   increased in a pattern of moderate LVH. Systolic function  was   normal. The estimated ejection fraction was in the range of 60%   to 65%. Wall motion was normal; there were no regional wall   motion abnormalities                                          MRI; Left PICA distribution acute/early subacute infarction and multiple additional very small infarcts in the superior cerebellar hemispheres bilaterally with edema and local mass effect resulting in effacement of fourth ventricle, downward descent of cerebellar tonsils, and partial effacement of quadrigeminal plate cistern. No gross hemorrhage. /MRA Head/Neck  1.Severe motion artifact. 2. Anterior communicating artery 3 mm prominent infundibulum versus aneurysm better characterized on prior CTA of head. 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 3. 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 4. Mid left V4 signal irregularity probably corresponding to thrombus prior CTA. 1 5.. 2. Patent carotid and vertebral arteries of the neck. 3. Possible mild right proximal ICA stenosis, suboptimal assessment due to extensive motion artifact.                                           IMPRESSION: Ms. Nicole [name] is a 52 y.o. 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 CTA head shows occluded LEFT PICA, LEFT V4  thromboembolism resulting in moderate stenosis with dissection not excluded, and severe stenosis of RIGHT PICA, secondary to dissection versus vasospasm or atherosclerosis.  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  Resultant Symptoms: H/A, N/V Stroke Risk Factors: hyperlipidemia and hypertension Other Stroke Risk Factors: Morbid Obesity, Body mass index is 53.66 kg/m.  Migraines, OSA/CPAP  12/23/2017   PLAN  12/23/2017: Continue Statin Continue ASA 81 mg, Nurse to verify allergy/intolerance is just nausea  . SBP Goal less than 180 Medicate for H/A pain, without over sedating Frequent neuro checks Telemetry monitoring PT/OT/SLP Consult PM & Rehab Consult Case Management /MSW Ongoing aggressive stroke risk factor management Patient counseled to be compliant with her medications Patient counseled on Lifestyle modifications including, Diet, Exercise, and Stress Follow up with [facility] in 6 weeks Outpatient TEE and loop recorder  DYSPHAGIA: Passes SLP swallow evaluation Aspiration Precautions in progress  CEREBRAL EDEMA: 23.4% NS Infusion per order set. Sodium goal 150-155. Na Levels every 6 hrs Neurosurgery consulted, Dr Yetta Barre - no need for emergent intervention at this time High risk for respiratory compromise & progression of cerebral edema next 2-3 days. Peak swelling from strokes is usually 3-5 days. At risk for sudden decompensation and death from acute noncommunicating hydrocephalus and/or brainstem compression/herniation Considerstatneurosurgical consultation for hemicraniectomy for mentation worsens or if there is any imaging evidence of herniation  HYPERTENSION: Stable SBP goal of < 180. DBP goal of < 105.  PRN Labetalol, Cleviprex infusion discontinued Long term BP goal normotensive. Restart home B/P medications - 12/19/2017 Home Meds:  Metoprolol, HCTZ, Cardizem   HX of OSA/CPAP RT consulted  HYPERLIPIDEMIA:    Component Value Date/Time   CHOL 140 12/19/2017 0930  TRIG 107 12/19/2017 0930   HDL 34 (L) 12/19/2017 0930   CHOLHDL 4.1 12/19/2017 0930   VLDL 21 12/19/2017 0930   LDLCALC 85 12/19/2017 0930  Home Meds:  Lipitor 40 mg LDL  goal < 70 Increased to Lipitor to 80 mg daily Continue statin at discharge  R/O DIABETES: Lab Results  Component Value Date   HGBA1C 6.3 (H) 12/19/2017    PENDING No results for input(s): GLUCAP in the last 168 hours. HgbA1c goal < 7.0 Continue CBG monitoring and SSI to maintain glucose 140-180 mg/dl DM education, if necessary   OBESITY Morbid Obesity, Body mass index is 53.66 kg/m. Greater than/equal to 30  Other Active Problems: Active Problems:   New cerebellar infarct (HCC)   Encounter for central line placement   Nausea vomiting and diarrhea   Cytotoxic brain edema (HCC)   Hydrocephalus    Hospital day # 4 VTE prophylaxis: SCD's  Diet : Diet heart healthy/carb modified Room service appropriate? Yes; Fluid consistency: Thin Diet - low sodium heart healthy   FAMILY UPDATES: family at bedside  TEAM UPDATES: Rhetta Mura, MD   Prior Home Stroke Medications:  ASA 81 mg, not taking daily Discharge Stroke Meds:  Please discharge patient on TBD, will need to verify if patient has a true allery to ASA vs an intolerance    Disposition: 07-Left Against Medical Advice/Left Without Being Seen/Elopement Therapy Recs:               PENDING Follow Up:  Follow-up Information    Micki Riley, MD. Schedule an appointment as soon as possible for a visit in 6 week(s).   Specialties:  Neurology, Radiology Contact information: [address] [place] [address] [phone]          System, Provider Not In -PCP Follow up in 1-2 weeks   Case Management aware of need      . Long discussion with the patient and answered questions.  The patient  may need TEE and prolonged cardiac monitoring later to find source of embolism in medical situation currently is too tenous to allow her to be sedated for TEE.. I have reviewed telemetry strips on last 2-3 days in the ICU and not found definite evidence of atrial fibrillation so far. Discussed with Dr. Mahala Menghini. Patient is willing to participate in the [facility] stroke registry   Recommend transfer to inpatient rehabilitation unit. Continue aspirin for stroke prevention for now Will arrange for outpatient TEE and loop recorder and follow-up visit in stroke clinic in 6 weeks.   Delia Heady, MD Medical Director [facility] Pager: [phone] 12/23/2017 12:58 PM  To contact Stroke Continuity provider, please refer to [email or web address]. After hours, contact General Neurology

## 2017-12-23 NOTE — Progress Notes (Signed)
Occupational Therapy Treatment Patient Details Name: Nicole [name] MRN: [id number] DOB: 01-17-1966 Today's Date: 12/23/2017    History of present illness 52 y.o. female admitted on 12/18/17 for dizziness and HA with resultant N/V/D.  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  Pt with significant PMH of low back pain with sciatica, migraines, HTN, asthma, and depression.   OT comments  Pt progressing well. Improved balance and endurance for ambulation, ADL transfers. Pt using vision stabilization strategies and avoids looking to right to assist with balance during mobility. Pt in good spirits and pleased with her progress. 02 sats in mid to upper 90s on RA, RN aware.  Follow Up Recommendations  CIR;Supervision/Assistance - 24 hour    Equipment Recommendations  3 in 1 bedside commode    Recommendations for Other Services      Precautions / Restrictions Precautions Precautions: Fall       Mobility Bed Mobility Overal bed mobility: Needs Assistance Bed Mobility: Supine to Sit     Supine to sit: Supervision;HOB elevated     General bed mobility comments: no assist, supervision for safety  Transfers Overall transfer level: Needs assistance Equipment used: Rolling walker (2 wheeled) Transfers: Sit to/from Stand Sit to Stand: Min guard         General transfer comment: min guard with and without walker    Balance Overall balance assessment: Needs assistance Sitting-balance support: Feet supported;No upper extremity supported Sitting balance-Leahy Scale: Good       Standing balance-Leahy Scale: Fair Standing balance comment: without UB support                           ADL either performed or assessed with clinical judgement   ADL Overall ADL's : Needs assistance/impaired                 Upper Body Dressing : Minimal assistance;Sitting       Toilet Transfer:  Minimal assistance;Ambulation;RW           Functional mobility during ADLs: Minimal assistance;Rolling walker General ADL Comments: pt using visual targets for gaze stabilization during ambulation, cued to sit EOB momentarily before standing     Vision   Additional Comments: reports dizziness when looking toward L   Perception     Praxis      Cognition Arousal/Alertness: Awake/alert Behavior During Therapy: WFL for tasks assessed/performed Overall Cognitive Status: Within Functional Limits for tasks assessed                                 General Comments: pt giggling throughout session.        Exercises     Shoulder Instructions       General Comments      Pertinent Vitals/ Pain       Pain Assessment: Faces Faces Pain Scale: Hurts little more Pain Location: headache Pain Descriptors / Indicators: Aching Pain Intervention(s): Monitored during session  Home Living                                          Prior Functioning/Environment              Frequency  Min 2X/week  Progress Toward Goals  OT Goals(current goals can now be found in the care plan section)  Progress towards OT goals: Progressing toward goals  Acute Rehab OT Goals Patient Stated Goal: Pt wants to get back to normal decrease HA OT Goal Formulation: With patient Time For Goal Achievement: 01/03/18 Potential to Achieve Goals: Good  Plan Discharge plan remains appropriate    Co-evaluation    PT/OT/SLP Co-Evaluation/Treatment: Yes Reason for Co-Treatment: For patient/therapist safety   OT goals addressed during session: Proper use of Adaptive equipment and DME      AM-PAC PT "6 Clicks" Daily Activity     Outcome Measure   Help from another person eating meals?: None Help from another person taking care of personal grooming?: A Little Help from another person toileting, which includes using toliet, bedpan, or urinal?: A Little Help  from another person bathing (including washing, rinsing, drying)?: A Little Help from another person to put on and taking off regular upper body clothing?: A Little Help from another person to put on and taking off regular lower body clothing?: A Little 6 Click Score: 19    End of Session Equipment Utilized During Treatment: Gait belt;Rolling walker  OT Visit Diagnosis: Unsteadiness on feet (R26.81);Dizziness and giddiness (R42);Pain   Activity Tolerance Patient tolerated treatment well   Patient Left in chair;with call bell/phone within reach;with family/visitor present   Nurse Communication Mobility status        Time: 1610-9604 OT Time Calculation (min): 22 min  Charges: OT General Charges $OT Visit: 1 Visit  12/23/2017 Martie Round, OTR/L Pager: [phone]   Evern Bio 12/23/2017, 11:07 AM

## 2017-12-23 NOTE — Progress Notes (Signed)
Patient family member stated that he would assist the patient with applying the mask.  RT filled water chamber and plugged CPAP to a red outlet.     12/23/17 2033  BiPAP/CPAP/SIPAP  BiPAP/CPAP/SIPAP Pt Type Adult  Mask Type Full face mask  Mask Size Large  EPAP 8 cmH2O  Oxygen Percent 21 %  BiPAP/CPAP/SIPAP CPAP  Patient Home Equipment No  Auto Titrate No  BiPAP/CPAP /SiPAP Vitals  Bilateral Breath Sounds Clear;Diminished

## 2017-12-23 NOTE — Progress Notes (Signed)
Physical Medicine and Rehabilitation Consult Reason for Consult: Decreased functional mobility Referring Physician: [facility]   HPI: Nicole [name] is a 52 y.o. right handed female with history of migraine headaches, obesity, hyperlipidemia, hypertension and chronic low back pain. Per chart review patient lives with significant other. Independent prior to admission. One level home with 5 steps to entry. Significant other is an Biomedical scientist and has flexibility and work schedule. Presented 12/19/2017 with persistent dizziness, fall as well as nausea vomiting. CT of the head showed acute infarcts in the left more than right cerebellum with posterior fossa mass effect and foramen magnum stenosis. CTA of head with no large vessel occlusion or stenosis. Patient did not receive TPA. 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 No gross hemorrhage. Neurosurgery Dr. Marikay Alar consulted for review of CTA no surgical intervention at this time. Patient was placed on 3% saline. MRA of the head with anterior communicating artery 3 mm prominent infundibulum versus aneurysm. Stable right distal V4 occlusion. No new large vessel occlusion. Echocardiogram with ejection fraction of 65% grade 1 diastolic dysfunction. Neurology consulted presently on aspirin for CVA prophylaxis. Tolerating a regular diet. Bouts of hypokalemia with supplement added. Physical and occupational therapy evaluations completed with recommendations of physical medicine rehabilitation consult.   Review of Systems  Constitutional: Negative for chills and fever.  HENT: Negative for hearing loss.   Eyes: Positive for blurred vision.  Respiratory: Positive for shortness of breath. Negative for cough.   Cardiovascular: Negative for chest pain, palpitations and leg swelling.    Gastrointestinal: Positive for nausea and vomiting.  Genitourinary: Negative for dysuria, flank pain and hematuria.  Musculoskeletal: Positive for back pain.  Skin: Negative for rash.  Neurological: Positive for dizziness and headaches. Negative for seizures.  All other systems reviewed and are negative.      Past Medical History:  Diagnosis Date  . Asthma   . Depression   . Hyperlipemia   . Hypertension   . Migraines   . Obesity   . Sciatica    History reviewed. No pertinent surgical history. No family history on file. Social History:  reports that  has never smoked. she has never used smokeless tobacco. She reports that she does not drink alcohol or use drugs. Allergies:       Allergies  Allergen Reactions  . Asa [Aspirin] Other (See Comments)    Contraindication per provider - pt states not allergic, PCP stated did not go with treatment regimen, ok for pt to take medication  . Ibuprofen Nausea Only  . Morphine And Related Palpitations         Medications Prior to Admission  Medication Sig Dispense Refill  . atorvastatin (LIPITOR) 40 MG tablet Take 40 mg by mouth daily.    Marland Kitchen diltiazem (CARDIZEM CD) 240 MG 24 hr capsule Take 240 mg by mouth 2 (two) times daily.     Marland Kitchen FLUoxetine (PROZAC) 20 MG tablet Take 40 mg by mouth 2 (two) times daily.     . furosemide (LASIX) 20 MG tablet Take 20 mg by mouth 2 (two) times daily.    . hydrochlorothiazide (HYDRODIURIL) 25 MG tablet Take 25 mg by mouth daily.    Marland Kitchen lidocaine (LIDODERM) 5 % Place 1 patch onto the skin daily. Remove & Discard patch within 12 hours or as directed by MD 30 patch 0  .  metoprolol succinate (TOPROL-XL) 25 MG 24 hr tablet Take 25 mg by mouth daily.  5  . omeprazole (PRILOSEC) 20 MG capsule Take 20 mg by mouth at bedtime.    Marland Kitchen zolpidem (AMBIEN) 5 MG tablet Take 5 mg by mouth at bedtime as needed for sleep.      Home: Home Living Family/patient expects to be discharged to:: Private  residence Living Arrangements: Spouse/significant other(significant other is an Biomedical scientist and has flexibility) Available Help at Discharge: Family, Available 24 hours/day Type of Home: Apartment Home Access: Stairs to enter Secretary/administrator of Steps: 5 Entrance Stairs-Rails: Right Home Layout: One level Bathroom Shower/Tub: Tub/shower unit, Engineer, building services: Standard Home Equipment: None(was looking at getting a rollator due to chronic back pain)  Functional History: Prior Function Level of Independence: Independent Comments: Pt reports she has back pain that limits her at times, and she was considering the purchase of a rollator.  Limited driving, does not work, does household chores, cooking.  Functional Status:  Mobility: Bed Mobility Overal bed mobility: Needs Assistance Bed Mobility: Supine to Sit, Sit to Supine Supine to sit: Supervision, HOB elevated Sit to supine: Supervision General bed mobility comments: Supervision to get EOB.  HOB mildly elevated to get up.   Transfers Overall transfer level: Needs assistance Equipment used: 2 person hand held assist Transfers: Sit to/from Stand, Stand Pivot Transfers Sit to Stand: +2 physical assistance, Min assist Stand pivot transfers: +2 physical assistance, Min assist General transfer comment: Two person min assist for balance and stability.  Pt's eyes closed during the transfer due to HA and dizziness.  Attempted targeting education.  LEs supported by bed.  Ambulation/Gait Ambulation/Gait assistance: +2 physical assistance, Min assist Ambulation Distance (Feet): 3 Feet Assistive device: 2 person hand held assist Gait Pattern/deviations: Step-to pattern, Leaning posteriorly General Gait Details: Pt able to take 3-4 steps towards the [facility] with two person min hand held assist for balance, again eyes closed due to increased HA and dizziness.    ADL: ADL Overall ADL's : Needs assistance/impaired Eating/Feeding:  Modified independent, Bed level Grooming: Wash/dry hands, Wash/dry face, Oral care, Brushing hair, Minimal assistance, Sitting Upper Body Bathing: Moderate assistance, Sitting Lower Body Bathing: Maximal assistance, Sit to/from stand Upper Body Dressing : Maximal assistance, Sitting Lower Body Dressing: Total assistance, Sit to/from stand Toilet Transfer: Minimal assistance, +2 for physical assistance, Stand-pivot, BSC Toileting- Clothing Manipulation and Hygiene: Total assistance, Sit to/from stand Functional mobility during ADLs: Minimal assistance, +2 for physical assistance General ADL Comments: limited by severe HA and dizziness   Cognition: Cognition Overall Cognitive Status: Within Functional Limits for tasks assessed(not specifica) Orientation Level: Oriented X4 Cognition Arousal/Alertness: Awake/alert Behavior During Therapy: Anxious(fearful of pain, fearful of the stroke, and fearful of movin) Overall Cognitive Status: Within Functional Limits for tasks assessed(not specifica)  Blood pressure (!) 137/93, pulse 73, temperature 98.3 F (36.8 C), temperature source Oral, resp. rate 20, height 5\' 3"  (1.6 m), weight (!) 136.4 kg (300 lb 11.3 oz), last menstrual period 12/11/2017, SpO2 97 %. Physical Exam  Vitals reviewed. Constitutional: She is oriented to person, place, and time.  52 year old right-handed obese female  HENT:  Head: Normocephalic.  Eyes: EOM are normal.  Neck: Normal range of motion. Neck supple. No thyromegaly present.  Cardiovascular: Normal rate, regular rhythm and normal heart sounds.  Respiratory: Effort normal and breath sounds normal. No respiratory distress.  GI: Soft. Bowel sounds are normal. She exhibits no distension.  Neurological: She is alert and oriented to  person, place, and time.  Patient is tearful. Follows full commands. Fair awareness of deficits  Skin: Skin is warm and dry.   Assessment/Plan: Diagnosis: Left PICA infarct with truncal  ataxia 1. Does the need for close, 24 hr/day medical supervision in concert with the patient's rehab needs make it unreasonable for this patient to be served in a less intensive setting? Potentially 2. Co-Morbidities requiring supervision/potential complications: Hypertension 3. Due to bladder management, bowel management, safety, skin/wound care, disease management, medication administration and patient education, does the patient require 24 hr/day rehab nursing? Potentially 4. Does the patient require coordinated care of a physician, rehab nurse, PT, OT to address physical and functional deficits in the context of the above medical diagnosis(es)? Potentially Addressing deficits in the following areas: balance, endurance, locomotion, strength, transferring, bowel/bladder control, bathing, dressing and toileting 5. Can the patient actively participate in an intensive therapy program of at least 3 hrs of therapy per day at least 5 days per week? Yes 6. The potential for patient to make measurable gains while on inpatient rehab is good 7. Anticipated functional outcomes upon discharge from inpatient rehab are modified independent  with PT, modified independent with OT, n/a with SLP. 8. Estimated rehab length of stay to reach the above functional goals is: NA 9. Anticipated D/C setting: Home 10. Anticipated post D/C treatments: HH therapy 11. Overall Rehab/Functional Prognosis: good  RECOMMENDATIONS: This patient's condition is appropriate for continued rehabilitative care in the following setting: [facility] Therapy Patient has agreed to participate in recommended program. Yes Note that insurance prior authorization may be required for reimbursement for recommended care.  Comment: Last PT note indicates min assist ambulation however patient states that she was able to ambulate without an assistive device with supervision today.  She also was able to stand with one foot in front of the other and her eyes  closed today. Based on this, I would expect that she can be managed at home with home health or outpatient therapy. If function declines may consider a very short inpatient rehab stay  Erick Colace, MD 12/21/2017          Routing History

## 2017-12-23 NOTE — Progress Notes (Signed)
Patient ID: Nicole [name], female   DOB: [birth year], 52 y.o.   MRN: [id number] Patient arrived via wheelchair from 3M13 and NT. Patient and family oriented to room, rehab process, rehab schedule, fall prevention plan, safety plan, and health resource notebook with verbal understanding.

## 2017-12-23 NOTE — PMR Pre-admission (Signed)
PMR Admission Coordinator Pre-Admission Assessment  Patient: Nicole [name] is an 52 y.o., female MRN: [id number] DOB: 13-Nov-1966 Height: '5\' 3"'  (160 cm) Weight: (!) 137.4 kg (302 lb 14.6 oz)              Insurance Information HMO:     PPO:      PCP:      IPA:      80/20:      OTHER: Brewing technologist PRIMARY: [facility]      Policy#: [id number]      Subscriber: Sharlotte Alamo CM Name: Avel Peace      Phone#: [phone]     Fax#: [phone] or email: [email or web address] Attach via update button Pre-Cert#: [id number]  For 14 days called 12/23/17 to confirm admission. Upon admission submit MD initial admission, Evals, treatment plans, progress notes, ELOS, and DC plans.    Employer: Spouse  Benefits:  Phone #: Verified online     Name: [email or web address] online Eff. Date:      Deduct: $150      Out of Pocket Max: $3000      Life Max: N/A CIR: $250 a day up until Max/Cap is met      SNF: $250 a day up until MAx/Cap is met Outpatient: Necessity      Co-Pay: $41 per visit Home Health: Necessity, 100       Co-Pay: $0 DME: 80%     Co-Pay: 20% Providers: In-network  SECONDARY: None        Medicaid Application Date:       Case Manager:  Disability Application Date:       Case Worker:   Emergency Contact Information Contact Information    Name Relation Home Work Mobile   Alviar,June Significant other [phone]     [name] Daughter   [phone]     Current Medical History  Patient Admitting Diagnosis: Left PICA infarct with truncal ataxia  History of Present Illness: Nicole [name] a 52 y.o.right handed femalewith history of migraine headaches, obesity, hyperlipidemia, hypertension and chronic low back pain maintained on Lidoderm patch. Per chart review patient lives with female significant other June. Independent prior to admission. One level home with 5 steps to entry. Significant other is an Mining engineer and has flexibility with work schedule. Presented 12/19/2017  with persistent dizziness, fall as well as nausea vomiting. CT of the head showed acute infarcts in the left more than right cerebellum with posterior fossa mass effect and foramen magnum stenosis. CTA of head with no large vessel occlusion or stenosis. Patient did not receive TPA. 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 No gross hemorrhage. MRA showed anterior communicating artery 3 mm prominent infundibulum versus aneurysm. Stable right distal V4 occlusion. No new large vessel occlusion. Neurosurgery Dr. Sherley Bounds consulted for review of CTA no surgical intervention at this time. Patient was placed on 3% saline, which has been discontinued. Latest follow-up cranial CT scan 12/23/2017 showed continued internal evolution of left larger than right bilateral cerebellar infarcts no evidence for hemorrhagic transformation. Noted associated mild obstructive hydrocephalus slightly improved from previous exam. Echocardiogram with ejection fraction of 67% grade 1 diastolic dysfunction. Neurology consulted presently on aspirin for CVA prophylaxis. Tolerating a regular texture diet. Bouts of hypokalemia with supplement added. Physical and occupational therapy evaluations completed with recommendations of physical medicine rehabilitation consult. Patient was admitted for  a comprehensive rehabilitation program 12/23/17  NIH Total: 0    Past Medical History  Past Medical History:  Diagnosis Date  . Asthma   . Depression   . Hyperlipemia   . Hypertension   . Migraines   . Obesity   . Sciatica     Family History  family history is not on file.  Prior Rehab/Hospitalizations:  Has the patient had major surgery during 100 days prior to admission? Yes  Current Medications   Current Facility-Administered Medications:  .  0.9 %   sodium chloride infusion, 250 mL, Intravenous, PRN, Scatliffe, Kristen D, MD .  acetaminophen (TYLENOL) tablet 650 mg, 650 mg, Oral, Q4H PRN, Amie Portland, MD, 650 mg at 12/22/17 0753 .  aspirin EC tablet 81 mg, 81 mg, Oral, Daily, Costello, Mary A, NP, 81 mg at 12/23/17 0934 .  atorvastatin (LIPITOR) tablet 80 mg, 80 mg, Oral, Daily, Costello, Mary A, NP, 80 mg at 12/23/17 0934 .  butalbital-acetaminophen-caffeine (FIORICET, ESGIC) 50-325-40 MG per tablet 1 tablet, 1 tablet, Oral, Q6H PRN, Costello, Mary A, NP, 1 tablet at 12/22/17 1009 .  chlorhexidine (PERIDEX) 0.12 % solution, , , ,  .  Chlorhexidine Gluconate Cloth 2 % PADS 6 each, 6 each, Topical, Daily, Garvin Fila, MD, 6 each at 12/23/17 1014 .  diltiazem (CARDIZEM) tablet 120 mg, 120 mg, Oral, Q6H, Costello, Mary A, NP, 120 mg at 12/23/17 1217 .  FLUoxetine (PROZAC) capsule 40 mg, 40 mg, Oral, BID, Costello, Mary A, NP, 40 mg at 12/23/17 0934 .  furosemide (LASIX) tablet 20 mg, 20 mg, Oral, BID, Costello, Mary A, NP, 20 mg at 12/23/17 0934 .  hydrochlorothiazide (HYDRODIURIL) tablet 25 mg, 25 mg, Oral, Daily, Costello, Mary A, NP, 25 mg at 12/23/17 0934 .  labetalol (NORMODYNE,TRANDATE) injection 5 mg, 5 mg, Intravenous, Q2H PRN, Costello, Mary A, NP, 5 mg at 12/19/17 2152 .  lidocaine (LIDODERM) 5 % 1 patch, 1 patch, Transdermal, Daily PRN, Costello, Mary A, NP .  MEDLINE mouth rinse, 15 mL, Mouth Rinse, BID, Garvin Fila, MD, 15 mL at 12/23/17 0940 .  metoprolol succinate (TOPROL-XL) 24 hr tablet 25 mg, 25 mg, Oral, Daily, Costello, Mary A, NP, 25 mg at 12/23/17 0934 .  ondansetron (ZOFRAN) injection 4 mg, 4 mg, Intravenous, Q6H PRN, Scatliffe, Kristen D, MD, 4 mg at 12/20/17 1917 .  pantoprazole (PROTONIX) injection 40 mg, 40 mg, Intravenous, QHS, Scatliffe, Kristen D, MD, 40 mg at 12/22/17 2121 .  sodium chloride flush (NS) 0.9 % injection 10-40 mL, 10-40 mL, Intracatheter, Q12H, Garvin Fila, MD, 10 mL at 12/23/17 1015 .   sodium chloride flush (NS) 0.9 % injection 10-40 mL, 10-40 mL, Intracatheter, PRN, Garvin Fila, MD .  traMADol Veatrice Bourbon) tablet 100 mg, 100 mg, Oral, Q6H PRN, Garvin Fila, MD, 100 mg at 12/23/17 1224  Patients Current Diet: Diet heart healthy/carb modified Room service appropriate? Yes; Fluid consistency: Thin Diet - low sodium heart healthy  Precautions / Restrictions Precautions Precautions: Fall Restrictions Weight Bearing Restrictions: No   Has the patient had 2 or more falls or a fall with injury in the past year?No  Prior Activity Level Community (5-7x/wk): Prior to admission patient was fully independent with self-care and home management.  She lives with female significant other, June who helps wtih medication management and appointments.  She has 5 daughters and 13 grandchildren that she enjoys spending time with.  She also enjoys crafts and running errands.   Home  Assistive Devices / Equipment Home Assistive Devices/Equipment: None Home Equipment: None(was looking at getting a rollator due to chronic back pain)  Prior Device Use: Indicate devices/aids used by the patient prior to current illness, exacerbation or injury? None of the above  Prior Functional Level Prior Function Level of Independence: Independent Comments: Pt reports she has back pain that limits her at times, and she was considering the purchase of a rollator.  Limited driving, does not work, does household chores, cooking.   Self Care: Did the patient need help bathing, dressing, using the toilet or eating? Independent  Indoor Mobility: Did the patient need assistance with walking from room to room (with or without device)? Independent  Stairs: Did the patient need assistance with internal or external stairs (with or without device)? Independent  Functional Cognition: Did the patient need help planning regular tasks such as shopping or remembering to take medications? Needed some help  Current  Functional Level Cognition  Overall Cognitive Status: Within Functional Limits for tasks assessed Orientation Level: Oriented X4 General Comments: pt giggling throughout session.    Extremity Assessment (includes Sensation/Coordination)  Upper Extremity Assessment: Overall WFL for tasks assessed(limited eval due to severe HA )  Lower Extremity Assessment: Defer to PT evaluation    ADLs  Overall ADL's : Needs assistance/impaired Eating/Feeding: Modified independent, Bed level Grooming: Wash/dry hands, Wash/dry face, Oral care, Brushing hair, Minimal assistance, Sitting Upper Body Bathing: Moderate assistance, Sitting Lower Body Bathing: Maximal assistance, Sit to/from stand Upper Body Dressing : Minimal assistance, Sitting Lower Body Dressing: Total assistance, Sit to/from stand Toilet Transfer: Minimal assistance, Ambulation, RW Toileting- Clothing Manipulation and Hygiene: Total assistance, Sit to/from stand Functional mobility during ADLs: Minimal assistance, Rolling walker General ADL Comments: pt using visual targets for gaze stabilization during ambulation, cued to sit EOB momentarily before standing    Mobility  Overal bed mobility: Needs Assistance Bed Mobility: Supine to Sit Supine to sit: Supervision, HOB elevated Sit to supine: Supervision General bed mobility comments: no assist, supervision for safety    Transfers  Overall transfer level: Needs assistance Equipment used: Rolling walker (2 wheeled) Transfers: Sit to/from Stand Sit to Stand: Min guard Stand pivot transfers: +2 physical assistance, Min assist General transfer comment: min guard with and without walker    Ambulation / Gait / Stairs / Wheelchair Mobility  Ambulation/Gait Ambulation/Gait assistance: Min guard, +2 safety/equipment(second person for chair follow) Ambulation Distance (Feet): 200 Feet Assistive device: Rolling walker (2 wheeled), None Gait Pattern/deviations: Step-through pattern,  Decreased stride length General Gait Details: pt ambulated in hallway with use of RW and close min guard for safety; pt using visual gaze strategies throughout; pt also able to ambulate ~8' forwards and ~8' backwards without use of an AD with close min guard Gait velocity: decreased Gait velocity interpretation: Below normal speed for age/gender    Posture / Balance Balance Overall balance assessment: Needs assistance Sitting-balance support: Feet supported, No upper extremity supported Sitting balance-Leahy Scale: Good Standing balance support: No upper extremity supported Standing balance-Leahy Scale: Fair Standing balance comment: without UB support    Special needs/care consideration BiPAP/CPAP: Yes, CPAP at night  CPM: No Continuous Drip IV: No Dialysis: No         Life Vest: No Oxygen: No Special Bed: No Trach Size: No Wound Vac (area): No       Skin: Dry  Bowel mgmt: Continent, last BM 12/21/17 Bladder mgmt: Continent  Diabetic mgmt: HgbA1c 6.3 with goal of < 7.0       Previous Home Environment Living Arrangements: Spouse/significant other(significant other is an Mining engineer and has flexibility) Available Help at Discharge: Family, Available 24 hours/day Type of Home: Apartment Home Layout: One level Home Access: Stairs to enter Entrance Stairs-Rails: Right Entrance Stairs-Number of Steps: 5 Bathroom Shower/Tub: Tub/shower unit, Architectural technologist: [place]: No  Discharge Living Setting Plans for Discharge Living Setting: Lives with (comment)(Female Significant other, June) Type of Home at Discharge: Apartment Discharge Home Layout: One level Discharge Home Access: Stairs to enter Entrance Stairs-Rails: None Entrance Stairs-Number of Steps: 4-5 Discharge Bathroom Shower/Tub: Tub/shower unit, Curtain Discharge Bathroom Toilet: Standard Discharge Bathroom Accessibility: Yes How Accessible: Accessible via  walker Does the patient have any problems obtaining your medications?: No  Social/Family/Support Systems Patient Roles: Partner, Parent, Other (Comment)(Grandparent ) Contact Information: Female significant other, June  Anticipated Caregiver: June Anticipated Caregiver's Contact Information: [phone] Ability/Limitations of Caregiver: None Caregiver Availability: 24/7 Discharge Plan Discussed with Primary Caregiver: Yes Is Caregiver In Agreement with Plan?: Yes Does Caregiver/Family have Issues with Lodging/Transportation while Pt is in Rehab?: No  Goals/Additional Needs Patient/Family Goal for Rehab: PT/OT: Mod I Expected length of stay: 5-7 days Cultural Considerations: None Dietary Needs: Heart Healthy, Carb. Mod. diet restrictions  Equipment Needs: TBD Special Service Needs: None Additional Information: Patient still married to Gus Marcantonio she reports that they are good friends [phone] Pt/Family Agrees to Admission and willing to participate: Yes Program Orientation Provided & Reviewed with Pt/Caregiver Including Roles  & Responsibilities: Yes  Decrease burden of Care through IP rehab admission: No  Possible need for SNF placement upon discharge:No  Patient Condition: This patient's medical and functional status has changed since the consult dated: 12/21/17 at 12:43pm in which the Rehabilitation Physician determined and documented that the patient's condition is appropriate for intensive rehabilitative care in an inpatient rehabilitation facility. See "History of Present Illness" (above) for medical update. Functional changes are: Min A +2, 200 feet. Patient's medical and functional status update has been discussed with the Rehabilitation physician and patient remains appropriate for inpatient rehabilitation. Will admit to inpatient rehab today.  Preadmission Screen Completed By:  Gunnar Fusi, 12/23/2017 1:24  PM ______________________________________________________________________   Discussed status with Dr. Naaman Plummer on 12/23/17 at 35 and received telephone approval for admission today.  Admission Coordinator:  Gunnar Fusi, time 1320/Date 12/23/17

## 2017-12-23 NOTE — Progress Notes (Signed)
Inpatient Rehabilitation  I have received insurance authorization and have IP Rehab bed available today.  Received clearance from Dr. Pearlean Brownie and Dr. Mahala Menghini. Will proceed with admission today.  Call if questions.   Charlane Ferretti., CCC/SLP Admission Coordinator  [facility] Inpatient Rehabilitation  Cell [phone]

## 2017-12-23 NOTE — Discharge Summary (Signed)
Physician Discharge Summary  Nicole Nicole [name] ZOX:[id number] DOB: [birth year] DOA: 12/18/2017  PCP: Nicole Nicole [name]  Admit date: 12/18/2017 Discharge date: 12/23/2017  Time spent: 25 minutes  Recommendations for Outpatient Follow-up:   Needs OP neurology follow-up  Follow with stroke MD on d/c  Initiated Metformin 500 bid on d/c for A1c 7  Needs good headache control and might benefit from other modalities as per Rhab MD  ?TEE [name] the future as per Neurology for cause  Discharge Diagnoses:  Active Problems:   New cerebellar infarct (HCC)   Encounter for central line placement   Nausea vomiting and diarrhea   Cytotoxic brain edema (HCC)   Hydrocephalus   Discharge Condition: improved  Diet recommendation:  hh low salt diabetic  Filed Weights   12/22/17 2222 12/23/17 0000 12/23/17 0500  Weight: (!) 137.4 kg (302 lb 14.6 oz) (!) 137.4 kg (302 lb 14.6 oz) (!) 137.4 kg (302 lb 14.6 oz)    History of present illness:  52 year old asthma, morbid obesity, chronic intractable migraine, HTN, HLD admitted on 12/19/2017 and CT head showed large left PICA infarct with edema and inferior tonsillar herniation CTA confirmed left PICA severe stenosis of right patient was admitted by neurology and neurosurgery was aware of the patient CCM was consulted to manage giving hypertonic saline and patient was kept on the same and started on this Transferred to [facility] 2/7   Hospital Course:  Large left posterior inferior cerebellar infarct with mass edema and early obstructive hydrocephalus-defer to neurology further management sodium is 133 and looks like goal is 150-placed on aspirin by neurology-patient can transfer to SDU as now off hypertonic saline-RPt CT head showed decrease [name] swelling of the cytotoxic edema and it was felt she was stable for d/c to CIR HTN blood pressure goal below 180 restarted on home meds on 2/4 History of OSA/CPAP placed on CPAP at night Diabetes mellitus with A1c  7-no med s PTA-will start metformin 500 bid and observe--might need to increase dose [name] 2-3 weeks and check sugars Morbid obesity BMI 52 Chronic intractable migraines-tramadol on board, might use fiorcet as per Rehab MD-could benefit from Ultrasound modalities etc    Procedures:  Multiple   Consultations:  Ns  Neurology   CIR   Discharge Exam: Vitals:   12/23/17 0934 12/23/17 1142  BP: (!) 118/103   Pulse: 85   Resp:    Temp:  99.2 F (37.3 C)  SpO2:      General: awake alert some dizzy a little tired no ha Cardiovascular: s1 s2 no m/r/g NSR Respiratory: clear no added sound no wheeze Neuro intact moving 4 limbs power 5/5 tongue protrudes midline Reflexes equivocal   Discharge Instructions   Discharge Instructions    Ambulatory referral to Neurology   Complete by:  As directed    An appointment is requested [name] approximately: 6 weeks Follow up with stroke clinic (Nicole Nicole [name] preferred, if not available, then consider Nicole Nicole [name], [facility] or Nicole Nicole [name] whoever is available) at [facility] [name] about 6-8 weeks. Thanks.   Diet - low sodium heart healthy   Complete by:  As directed    Increase activity slowly   Complete by:  As directed      Allergies as of 12/23/2017      Reactions   Asa [aspirin] Other (See Comments)   Contraindication per provider - pt states not allergic, PCP stated did not go with treatment regimen, ok for pt to take medication   Ibuprofen Nausea Only  Morphine And Related Palpitations      Medication List    STOP taking these medications   furosemide 20 MG tablet Commonly known as:  LASIX     TAKE these medications   atorvastatin 40 MG tablet Commonly known as:  LIPITOR Take 40 mg by mouth daily.   diltiazem 240 MG 24 hr capsule Commonly known as:  CARDIZEM CD Take 240 mg by mouth 2 (two) times daily.   FLUoxetine 20 MG tablet Commonly known as:  PROZAC Take 40 mg by mouth 2 (two) times daily.   hydrochlorothiazide 25 MG tablet Commonly  known as:  HYDRODIURIL Take 25 mg by mouth daily.   lidocaine 5 % Commonly known as:  LIDODERM Place 1 patch onto the skin daily. Remove & Discard patch within 12 hours or as directed by MD   metFORMIN 500 MG tablet Commonly known as:  GLUCOPHAGE Take 1 tablet (500 mg total) by mouth 2 (two) times daily with a meal.   metoprolol succinate 25 MG 24 hr tablet Commonly known as:  TOPROL-XL Take 25 mg by mouth daily.   omeprazole 20 MG capsule Commonly known as:  PRILOSEC Take 20 mg by mouth at bedtime.   traMADol 50 MG tablet Commonly known as:  ULTRAM Take 2 tablets (100 mg total) by mouth every 6 (six) hours as needed for moderate pain.   zolpidem 5 MG tablet Commonly known as:  AMBIEN Take 5 mg by mouth at bedtime as needed for sleep.      Allergies  Allergen Reactions  . Asa [Aspirin] Other (See Comments)    Contraindication per provider - pt states not allergic, PCP stated did not go with treatment regimen, ok for pt to take medication  . Ibuprofen Nausea Only  . Morphine And Related Palpitations   Follow-up Information    Nicole Riley, MD. Schedule an appointment as soon as possible for a visit [name] 6 week(s).   Specialties:  Neurology, Radiology Contact information: [address] [place] [address] [phone]            The results of significant diagnostics from this hospitalization (including imaging, microbiology, ancillary and laboratory) are listed below for reference.    Significant Diagnostic Studies: Ct Angio Head W Or Wo Nicole [name]  Result Date: 12/19/2017 CLINICAL DATA:  Dizziness, fall 2 days ago with possible loss of consciousness. EXAM: CT ANGIOGRAPHY HEAD TECHNIQUE: Multidetector CT imaging of the head was performed using the standard protocol during bolus administration of intravenous Nicole [name]. Multiplanar CT image reconstructions and MIPs were obtained to evaluate the vascular anatomy. Nicole [name]:  50mL ISOVUE-370 IOPAMIDOL  (ISOVUE-370) INJECTION 76% COMPARISON:  None. FINDINGS: CT HEAD BRAIN: Large LEFT inferior cerebellar acute to subacute infarct. Additional small bilateral cerebellar infarcts. Cytotoxic edema resulting and inferior cerebellar tonsil herniation, effaced cerebral spinal fluid at the foramen magnum. Very mild prominence of the supratentorial ventricles. No intraparenchymal hemorrhage. No abnormal extra-axial fluid collections. VASCULAR: Unremarkable. SKULL/SOFT TISSUES: No skull fracture. No significant soft tissue swelling. ORBITS/SINUSES: The included ocular globes and orbital contents are normal.The mastoid aircells and included paranasal sinuses are well-aerated. OTHER: None. CTA HEAD-noisy image quality. ANTERIOR CIRCULATION: Patent cervical internal carotid arteries, petrous, cavernous and supra clinoid internal carotid arteries. Patent anterior communicating artery with 4 mm origin aneurysm versus infundibulum. Patent anterior and middle cerebral arteries. 5 mm bilobed LEFT MCA bifurcation. No large vessel occlusion, significant stenosis, Nicole [name] extravasation. POSTERIOR CIRCULATION: Tandem severe stenosis RIGHT posterior-inferior cerebellar artery, non demonstrable LEFT  posterior-inferior cerebellar artery. RIGHT distal V4 segment occluded. Central filling defect LEFT mid V4 segment with moderate stenosis. Patent basilar artery. Robust bilateral posterior communicating arteries, patent posterior cerebral arteries. No Nicole [name] extravasation or aneurysm. VENOUS SINUSES: Major dural venous sinuses are patent though not tailored for evaluation on this angiographic examination. ANATOMIC VARIANTS: None. DELAYED PHASE: Not performed. MIP images reviewed. IMPRESSION: CT HEAD: 1. 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 Additional acute small bilateral cerebellar infarcts. 2. Cerebellar edema resulting [name] inferior tonsillar herniation and early obstructive hydrocephalus.  CTA HEAD: 1. Occluded LEFT PICA. Severe stenosis RIGHT PICA, secondary to dissection, less likely vaso spasm or atherosclerosis. 2. LEFT V4 thromboembolism resulting [name] moderate stenosis, dissection not excluded. 3. 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 4. Recommend CTA NECK. Critical Value/emergent results were called by telephone at the time of interpretation on 12/19/2017 at 2:10 am to PA. [name] , who verbally acknowledged these results. Electronically Signed   By: Awilda Metro M.D.   On: 12/19/2017 02:18   Dg Chest 2 View  Result Date: 12/18/2017 CLINICAL DATA:  Pt to ED via [facility] with flu like symptoms--started Wednesday-- saw PCP -- given meds for migraine, felt better Wednesday night, worse Thursday-- EXAM: CHEST  2 VIEW COMPARISON:  None. FINDINGS: The heart size and mediastinal contours are within normal limits. Both lungs are clear. The visualized skeletal structures are unremarkable. IMPRESSION: No active cardiopulmonary disease. Electronically Signed   By: Norva Pavlov M.D.   On: 12/18/2017 17:16   Ct Head Wo Nicole [name]  Result Date: 12/23/2017 CLINICAL DATA:  Follow-up examination for stroke with hydrocephalus. EXAM: CT HEAD WITHOUT Nicole [name] TECHNIQUE: Contiguous axial images were obtained from the base of the skull through the vertex without intravenous Nicole [name]. COMPARISON:  Prior CT from 12/21/2017. FINDINGS: Brain: Continued interval evolution [name] bilateral cerebellar infarcts, left larger than right no evidence for hemorrhagic transformation. Associated mass effect within the posterior fossa with secondary partial effacement of the fourth ventricle and quadrigeminal plate cistern, greater on the left. Mild mass effect on the adjacent brainstem again noted. Associated inferior tonsillar herniation noted. Slightly decreased ventricular dilatation of the lateral and third ventricles as compared to previous, most evident at the temporal horns. No new  intracranial infarct. No acute intracranial hemorrhage. No mass lesion or midline shift. No extra-axial fluid collection. Vascular: Calcified atherosclerosis at the skull base. No worrisome hyperdense vessel. Skull: Scalp soft tissues and calvarium are unchanged and within normal limits. Sinuses/Orbits: Globes orbital soft tissues within normal limits. Scattered paranasal sinus disease similar to previous. Trace opacity within the right mastoid air cells noted. Other: None. IMPRESSION: 1. Continued interval evolution of left larger than right bilateral cerebellar infarcts with associated mass effect and inferior tonsillar herniation. Associated mild obstructive hydrocephalus is slightly improved from previous exam. No evidence for hemorrhagic transformation. 2. No other new acute intracranial abnormality. Electronically Signed   By: Rise Mu M.D.   On: 12/23/2017 07:45   Ct Head Wo Nicole [name]  Result Date: 12/21/2017 CLINICAL DATA:  Stroke follow-up. EXAM: CT HEAD WITHOUT Nicole [name] TECHNIQUE: Contiguous axial images were obtained from the base of the skull through the vertex without intravenous Nicole [name]. COMPARISON:  Brain MRI and CT 12/19/2017 FINDINGS: Brain: A large left PICA infarct and additional smaller bilateral cerebellar infarcts are unchanged from the prior studies. Associated edema and posterior fossa mass effect resulting [name] partial effacement of the fourth ventricle and quadrilateral plate cistern and mass effect on  the brainstem are unchanged. Inferior tonsillar herniation and mild dilatation of the lateral ventricles are unchanged. There is no evidence of acute supratentorial infarct. No extra-axial fluid collection or acute hemorrhage is identified. Vascular: Calcified atherosclerosis at the skull base, most notably involving the left vertebral artery. Skull: No fracture or focal osseous lesion. Sinuses/Orbits: Large left maxillary sinus mucous retention cyst with mild mucosal thickening  scattered throughout the paranasal sinuses bilaterally. Clear mastoid air cells. Unremarkable orbits. Other: None. IMPRESSION: Unchanged edema and mass effect associated with acute left larger than right cerebellar infarcts. Unchanged inferior tonsillar herniation and mild obstructive hydrocephalus. No acute hemorrhage. Electronically Signed   By: Sebastian Ache M.D.   On: 12/21/2017 07:25   Ct Head Wo Nicole [name]  Result Date: 12/19/2017 CLINICAL DATA:  Fatigue and malaise. EXAM: CT HEAD WITHOUT Nicole [name] TECHNIQUE: Contiguous axial images were obtained from the base of the skull through the vertex without intravenous Nicole [name]. COMPARISON:  CTA earlier today FINDINGS: Brain: Acute infarcts involving the left more than right cerebellum with cytotoxic edema causing posterior fossa mass effect. There is cerebellar tonsillar descent by 8-9 mm and fourth ventricular narrowing/effacement. Mild lateral ventriculomegaly, most convincing at the temporal horns. Negative for hemorrhage or visible interval infarct. Vascular: Recent CTA. No hyperdense vessel. There is atherosclerotic calcification of the left V4 segment. Skull: Negative Sinuses/Orbits: Large retention cyst [name] the left maxillary sinus. Mucosal thickening elsewhere. IMPRESSION: Acute infarcts [name] the left more than right cerebellum with posterior fossa mass effect and foramen magnum stenosis. Fourth ventricular narrowing with mild lateral ventriculomegaly. No progression from study earlier today. Electronically Signed   By: Marnee Spring M.D.   On: 12/19/2017 07:34   Mr Maxine Glenn Head Wo Nicole [name]  Addendum Date: 12/19/2017   ADDENDUM REPORT: 12/19/2017 16:00 ADDENDUM: 20 cc MultiHance administered. Electronically Signed   By: Mitzi Hansen M.D.   On: 12/19/2017 16:00   Result Date: 12/19/2017 CLINICAL DATA:  52 y/o  F; acute left PICA distribution infarction. EXAM: MRI HEAD WITHOUT AND WITH Nicole [name] MRA HEAD WITHOUT Nicole [name] MRA NECK WITHOUT AND WITH  Nicole [name] TECHNIQUE: Multiplanar, multiecho pulse sequences of the brain and surrounding structures were obtained without and with intravenous Nicole [name]. Angiographic images of the Circle of Willis were obtained using MRA technique without intravenous Nicole [name]. Angiographic images of the neck were obtained using MRA technique without and with intravenous Nicole [name]. Carotid stenosis measurements (when applicable) are obtained utilizing NASCET criteria, using the distal internal carotid diameter as the denominator. COMPARISON:  None. FINDINGS: MRI HEAD FINDINGS Severe motion degradation of multiple sequences. Brain: Reduced diffusion within the left inferior cerebellar hemisphere [name] PICA distribution compatible with acute/early subacute infarct additional very small areas of acute/early subacute infarction within the left superior cerebellar hemisphere, vermis, and right superior cerebellar hemisphere. Infarcts demonstrate T2 FLAIR hyperintense signal abnormality and mild local mass effect with partial effacement of the fourth ventricle, left quadrigeminal plate cistern, and downward cerebellar tonsillar descent. Motion degraded SWI sequence, no gross hemorrhage. Findings are stable [name] comparison with prior CT of the head given differences [name] technique. Stable mild enlargement of the lateral ventricles. No extra-axial collection no additional signal abnormality elsewhere [name] the brain. No abnormal enhancement of the brain. Vascular: As below. Skull and upper cervical spine: Normal marrow signal. Sinuses/Orbits: Patchy opacification of ethmoid sinuses, large left maxillary sinus mucous retention cyst, and diffuse paranasal sinus mucosal thickening. No abnormal signal of mastoid air cells. Orbits are unremarkable. Other: Negative. MRA HEAD FINDINGS Severe motion degradation. Internal carotid arteries:  Patent. Anterior cerebral arteries:  Patent. Middle cerebral arteries: Patent. Left MCA bifurcation fusiform aneurysm is  poorly visualized on the current study due to motion artifact. Anterior communicating artery: Right A1/A-comm junction prominent infundibulum versus 3 x 3 mm aneurysm (series 4, image 75). Posterior communicating arteries:  Patent.  Bilateral fetal PCA. Posterior cerebral arteries:  Patent. Basilar artery:  Diffuse contour irregularity. Vertebral arteries: The right vertebral artery terminates [name] the right PICA and the downstream right vertebral artery is persistently occluded. Unchanged irregularity of the left V4 segment probably corresponding to thrombus as seen on CTA. No appreciable left PICA. No additional large vessel occlusion or aneurysm identified. MRA NECK FINDINGS Severe motion degradation. Aortic arch: Patent. Right common carotid artery: Patent. Right internal carotid artery: Patent. There may be a mild degree of stenosis of the proximal right internal carotid artery. Right vertebral artery: Patent. Left common carotid artery: Patent. Left Internal carotid artery: Patent. Left Vertebral artery: Patent. There is no definite evidence of hemodynamically significant stenosis by NASCET criteria, occlusion, or aneurysm unless noted above. IMPRESSION: MRI head: 1. Severe motion artifact. 2. Left PICA distribution acute/early subacute infarction and multiple additional very small infarcts [name] the superior cerebellar hemispheres bilaterally with edema and local mass effect resulting [name] effacement of fourth ventricle, downward descent of cerebellar tonsils, and partial effacement of quadrigeminal plate cistern. No gross hemorrhage. Findings are stable from prior CT of head given differences [name] technique. 3. Mild to moderate paranasal sinus disease. MRA head: 1. Severe motion artifact. 2. Anterior communicating artery 3 mm prominent infundibulum versus aneurysm better characterized on prior CTA of head. 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 3. Stable right distal V4 occlusion and  no visible left PICA, likely occluded. 4. Mid left V4 signal irregularity probably corresponding to thrombus prior CTA. 5. No new large vessel occlusion or aneurysm identified. MRA neck: 1. Severe motion artifact. 2. Patent carotid and vertebral arteries of the neck. 3. Possible mild right proximal ICA stenosis, suboptimal assessment due to extensive motion artifact. Consider carotid Doppler or repeat CTA/MRA when patient is able to hold still. Electronically Signed: By: Mitzi Hansen M.D. On: 12/19/2017 15:07   Mr Maxine Glenn Neck W Wo Nicole [name]  Addendum Date: 12/19/2017   ADDENDUM REPORT: 12/19/2017 16:00 ADDENDUM: 20 cc MultiHance administered. Electronically Signed   By: Mitzi Hansen M.D.   On: 12/19/2017 16:00   Result Date: 12/19/2017 CLINICAL DATA:  52 y/o  F; acute left PICA distribution infarction. EXAM: MRI HEAD WITHOUT AND WITH Nicole [name] MRA HEAD WITHOUT Nicole [name] MRA NECK WITHOUT AND WITH Nicole [name] TECHNIQUE: Multiplanar, multiecho pulse sequences of the brain and surrounding structures were obtained without and with intravenous Nicole [name]. Angiographic images of the Circle of Willis were obtained using MRA technique without intravenous Nicole [name]. Angiographic images of the neck were obtained using MRA technique without and with intravenous Nicole [name]. Carotid stenosis measurements (when applicable) are obtained utilizing NASCET criteria, using the distal internal carotid diameter as the denominator. COMPARISON:  None. FINDINGS: MRI HEAD FINDINGS Severe motion degradation of multiple sequences. Brain: Reduced diffusion within the left inferior cerebellar hemisphere [name] PICA distribution compatible with acute/early subacute infarct additional very small areas of acute/early subacute infarction within the left superior cerebellar hemisphere, vermis, and right superior cerebellar hemisphere. Infarcts demonstrate T2 FLAIR hyperintense signal abnormality and mild local mass effect with partial  effacement of the fourth ventricle, left quadrigeminal plate cistern, and downward cerebellar tonsillar descent. Motion degraded SWI sequence, no gross hemorrhage. Findings are  stable [name] comparison with prior CT of the head given differences [name] technique. Stable mild enlargement of the lateral ventricles. No extra-axial collection no additional signal abnormality elsewhere [name] the brain. No abnormal enhancement of the brain. Vascular: As below. Skull and upper cervical spine: Normal marrow signal. Sinuses/Orbits: Patchy opacification of ethmoid sinuses, large left maxillary sinus mucous retention cyst, and diffuse paranasal sinus mucosal thickening. No abnormal signal of mastoid air cells. Orbits are unremarkable. Other: Negative. MRA HEAD FINDINGS Severe motion degradation. Internal carotid arteries:  Patent. Anterior cerebral arteries:  Patent. Middle cerebral arteries: Patent. Left MCA bifurcation fusiform aneurysm is poorly visualized on the current study due to motion artifact. Anterior communicating artery: Right A1/A-comm junction prominent infundibulum versus 3 x 3 mm aneurysm (series 4, image 75). Posterior communicating arteries:  Patent.  Bilateral fetal PCA. Posterior cerebral arteries:  Patent. Basilar artery:  Diffuse contour irregularity. Vertebral arteries: The right vertebral artery terminates [name] the right PICA and the downstream right vertebral artery is persistently occluded. Unchanged irregularity of the left V4 segment probably corresponding to thrombus as seen on CTA. No appreciable left PICA. No additional large vessel occlusion or aneurysm identified. MRA NECK FINDINGS Severe motion degradation. Aortic arch: Patent. Right common carotid artery: Patent. Right internal carotid artery: Patent. There may be a mild degree of stenosis of the proximal right internal carotid artery. Right vertebral artery: Patent. Left common carotid artery: Patent. Left Internal carotid artery: Patent. Left Vertebral  artery: Patent. There is no definite evidence of hemodynamically significant stenosis by NASCET criteria, occlusion, or aneurysm unless noted above. IMPRESSION: MRI head: 1. Severe motion artifact. 2. Left PICA distribution acute/early subacute infarction and multiple additional very small infarcts [name] the superior cerebellar hemispheres bilaterally with edema and local mass effect resulting [name] effacement of fourth ventricle, downward descent of cerebellar tonsils, and partial effacement of quadrigeminal plate cistern. No gross hemorrhage. Findings are stable from prior CT of head given differences [name] technique. 3. Mild to moderate paranasal sinus disease. MRA head: 1. Severe motion artifact. 2. Anterior communicating artery 3 mm prominent infundibulum versus aneurysm better characterized on prior CTA of head. 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 3. 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 4. Mid left V4 signal irregularity probably corresponding to thrombus prior CTA. 5. No new large vessel occlusion or aneurysm identified. MRA neck: 1. Severe motion artifact. 2. Patent carotid and vertebral arteries of the neck. 3. Possible mild right proximal ICA stenosis, suboptimal assessment due to extensive motion artifact. Consider carotid Doppler or repeat CTA/MRA when patient is able to hold still. Electronically Signed: By: Mitzi Hansen M.D. On: 12/19/2017 15:07   Mr Nicole Nicole [name]  Addendum Date: 12/19/2017   ADDENDUM REPORT: 12/19/2017 16:00 ADDENDUM: 20 cc MultiHance administered. Electronically Signed   By: Mitzi Hansen M.D.   On: 12/19/2017 16:00   Result Date: 12/19/2017 CLINICAL DATA:  52 y/o  F; acute left PICA distribution infarction. EXAM: MRI HEAD WITHOUT AND WITH Nicole [name] MRA HEAD WITHOUT Nicole [name] MRA NECK WITHOUT AND WITH Nicole [name] TECHNIQUE: Multiplanar, multiecho pulse sequences of the brain and surrounding  structures were obtained without and with intravenous Nicole [name]. Angiographic images of the Circle of Willis were obtained using MRA technique without intravenous Nicole [name]. Angiographic images of the neck were obtained using MRA technique without and with intravenous Nicole [name]. Carotid stenosis measurements (when applicable) are obtained utilizing NASCET criteria, using the distal internal carotid diameter as the denominator.  COMPARISON:  None. FINDINGS: MRI HEAD FINDINGS Severe motion degradation of multiple sequences. Brain: Reduced diffusion within the left inferior cerebellar hemisphere [name] PICA distribution compatible with acute/early subacute infarct additional very small areas of acute/early subacute infarction within the left superior cerebellar hemisphere, vermis, and right superior cerebellar hemisphere. Infarcts demonstrate T2 FLAIR hyperintense signal abnormality and mild local mass effect with partial effacement of the fourth ventricle, left quadrigeminal plate cistern, and downward cerebellar tonsillar descent. Motion degraded SWI sequence, no gross hemorrhage. Findings are stable [name] comparison with prior CT of the head given differences [name] technique. Stable mild enlargement of the lateral ventricles. No extra-axial collection no additional signal abnormality elsewhere [name] the brain. No abnormal enhancement of the brain. Vascular: As below. Skull and upper cervical spine: Normal marrow signal. Sinuses/Orbits: Patchy opacification of ethmoid sinuses, large left maxillary sinus mucous retention cyst, and diffuse paranasal sinus mucosal thickening. No abnormal signal of mastoid air cells. Orbits are unremarkable. Other: Negative. MRA HEAD FINDINGS Severe motion degradation. Internal carotid arteries:  Patent. Anterior cerebral arteries:  Patent. Middle cerebral arteries: Patent. Left MCA bifurcation fusiform aneurysm is poorly visualized on the current study due to motion artifact. Anterior communicating  artery: Right A1/A-comm junction prominent infundibulum versus 3 x 3 mm aneurysm (series 4, image 75). Posterior communicating arteries:  Patent.  Bilateral fetal PCA. Posterior cerebral arteries:  Patent. Basilar artery:  Diffuse contour irregularity. Vertebral arteries: The right vertebral artery terminates [name] the right PICA and the downstream right vertebral artery is persistently occluded. Unchanged irregularity of the left V4 segment probably corresponding to thrombus as seen on CTA. No appreciable left PICA. No additional large vessel occlusion or aneurysm identified. MRA NECK FINDINGS Severe motion degradation. Aortic arch: Patent. Right common carotid artery: Patent. Right internal carotid artery: Patent. There may be a mild degree of stenosis of the proximal right internal carotid artery. Right vertebral artery: Patent. Left common carotid artery: Patent. Left Internal carotid artery: Patent. Left Vertebral artery: Patent. There is no definite evidence of hemodynamically significant stenosis by NASCET criteria, occlusion, or aneurysm unless noted above. IMPRESSION: MRI head: 1. Severe motion artifact. 2. Left PICA distribution acute/early subacute infarction and multiple additional very small infarcts [name] the superior cerebellar hemispheres bilaterally with edema and local mass effect resulting [name] effacement of fourth ventricle, downward descent of cerebellar tonsils, and partial effacement of quadrigeminal plate cistern. No gross hemorrhage. Findings are stable from prior CT of head given differences [name] technique. 3. Mild to moderate paranasal sinus disease. MRA head: 1. Severe motion artifact. 2. Anterior communicating artery 3 mm prominent infundibulum versus aneurysm better characterized on prior CTA of head. 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 3. 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 4. Mid left V4 signal irregularity probably  corresponding to thrombus prior CTA. 5. No new large vessel occlusion or aneurysm identified. MRA neck: 1. Severe motion artifact. 2. Patent carotid and vertebral arteries of the neck. 3. Possible mild right proximal ICA stenosis, suboptimal assessment due to extensive motion artifact. Consider carotid Doppler or repeat CTA/MRA when patient is able to hold still. Electronically Signed: By: Mitzi Hansen M.D. On: 12/19/2017 15:07   Dg Chest Port 1 View  Result Date: 12/19/2017 CLINICAL DATA:  Femoral line placement. EXAM: PORTABLE CHEST 1 VIEW COMPARISON:  Chest radiograph December 18, 2017 FINDINGS: The cardiac silhouette is upper limits of normal [name] size, mediastinal silhouette is not suspicious. No pleural effusion or focal  consolidation. No pneumothorax. Soft tissue planes and included osseous structures are non suspicious. IMPRESSION: Borderline cardiomegaly.  No acute pulmonary process. Electronically Signed   By: Awilda Metro M.D.   On: 12/19/2017 06:11    Microbiology: Recent Results (from the past 240 hour(s))  MRSA PCR Screening     Status: None   Collection Time: 12/19/17  4:30 AM  Result Value Ref Range Status   MRSA by PCR NEGATIVE NEGATIVE Final    Comment:        The GeneXpert MRSA Assay (FDA approved for NASAL specimens only), is one component of a comprehensive MRSA colonization surveillance program. It is not intended to diagnose MRSA infection nor to guide or monitor treatment for MRSA infections. Performed at [facility], [address], [place], [place] [address]      Labs: Basic Metabolic Panel: Recent Labs  Lab 12/18/17 1416  12/19/17 1115  12/20/17 0245  12/21/17 0354  12/22/17 0347 12/22/17 6045 12/22/17 1454 12/22/17 2223 12/23/17 0418 12/23/17 0908  NA 131*   < > 145   < > 142  142   < > 137   < > 136 133* 134* 136 134* 135  K 3.5  --  3.4*  --  3.4*  --  3.1*  --  3.7  --   --   --   --   --   CL 97*  --  112*  --  107  --   102  --  95*  --   --   --   --   --   CO2 21*  --  23  --  24  --  25  --  27  --   --   --   --   --   GLUCOSE 137*  --  100*  --  100*  --  108*  --  67  --   --   --   --   --   BUN 9  --  11  --  7  --  7  --  8  --   --   --   --   --   CREATININE 0.65  --  0.62  --  0.63  --  0.57  --  0.56  --   --   --   --   --   CALCIUM 9.2  --  8.1*  --  8.5*  --  8.4*  --  8.6*  --   --   --   --   --   MG  --   --  2.1  --   --   --   --   --   --   --   --   --   --   --    < > = values [name] this interval not displayed.   Liver Function Tests: Recent Labs  Lab 12/18/17 1416  AST 22  ALT 16  ALKPHOS 105  BILITOT 0.9  PROT 8.6*  ALBUMIN 3.9   No results for input(s): LIPASE, AMYLASE [name] the last 168 hours. No results for input(s): AMMONIA [name] the last 168 hours. CBC: Recent Labs  Lab 12/18/17 1416 12/20/17 0245 12/21/17 0354  WBC 12.7* 9.2 10.2  NEUTROABS 10.5*  --   --   HGB 12.4 10.2* 9.7*  HCT 39.5 33.9* 32.9*  MCV 80.3 81.9 81.8  PLT 212 174 196   Cardiac Enzymes: No  results for input(s): CKTOTAL, CKMB, CKMBINDEX, TROPONINI [name] the last 168 hours. BNP: BNP (last 3 results) No results for input(s): BNP [name] the last 8760 hours.  ProBNP (last 3 results) No results for input(s): PROBNP [name] the last 8760 hours.  CBG: No results for input(s): GLUCAP [name] the last 168 hours.     Signed:  Rhetta Mura MD   [facility] 12/23/2017, 11:42 AM

## 2017-12-24 ENCOUNTER — Inpatient Hospital Stay (HOSPITAL_COMMUNITY): Admitting: Physical Therapy

## 2017-12-24 ENCOUNTER — Inpatient Hospital Stay (HOSPITAL_COMMUNITY)

## 2017-12-24 DIAGNOSIS — Z8673 Personal history of transient ischemic attack (TIA), and cerebral infarction without residual deficits: Secondary | ICD-10-CM

## 2017-12-24 DIAGNOSIS — E871 Hypo-osmolality and hyponatremia: Secondary | ICD-10-CM

## 2017-12-24 DIAGNOSIS — I10 Essential (primary) hypertension: Secondary | ICD-10-CM

## 2017-12-24 DIAGNOSIS — D62 Acute posthemorrhagic anemia: Secondary | ICD-10-CM

## 2017-12-24 NOTE — Progress Notes (Signed)
Occupational Therapy Session Note  Patient Details  Name: Nicole [name] MRN: [id number] Date of Birth: 03-29-1966  Today's Date: 12/24/2017 OT Individual Time: 1530-1555 OT Individual Time Calculation (min): 25 min    Short Term Goals: Week 1:  OT Short Term Goal 1 (Week 1): stg=ltg 2/2 elos  Skilled Therapeutic Interventions/Progress Updates:  Balance/vestibular training;Discharge planning;Pain management;Self Care/advanced ADL retraining;Therapeutic Activities;UE/LE Coordination activities;Disease mangement/prevention;Functional mobility training;Patient/family education;Skin care/wound managment;Therapeutic Exercise;Visual/perceptual remediation/compensation;Community reintegration;DME/adaptive equipment instruction;Neuromuscular re-education;Psychosocial support;UE/LE Strength taining/ROM;Wheelchair propulsion/positioning   1;1. Pt and fiance present during session and RN removing IV upon entering room. Pt supin>sitting EOB with supervision and Vc for gaze stabilization. OT demonstrates reacher, sock aide and shoe funnel for doffing/donning socks and shoes. Pt able to return demonstrate with min instructional cues and increased time d/t fatigue. Pt EOB>supine iwht supervision and OT exits room with bed alarm set, call light in reach, and fiance present  Therapy Documentation Precautions:  Precautions Precautions: Fall Restrictions Weight Bearing Restrictions: No  See Function Navigator for Current Functional Status.   Therapy/Group: Individual Therapy  Shon Hale 12/24/2017, 3:56 PM

## 2017-12-24 NOTE — Evaluation (Signed)
Physical Therapy Assessment and Plan  Patient Details  Name: Nicole [name] MRN: [id number] Date of Birth: [birth year]  PT Diagnosis: Abnormality of gait, Dizziness and giddiness, Hemiplegia non-dominant, Muscle weakness and Vertigo Rehab Potential: Good ELOS: 5-7 days    Today's Date: 12/24/2017 PT Individual Time: 1005-1100 AND 1300-1350 PT Individual Time Calculation (min): 55 min  And 60mn  Problem List:  Patient Active Problem List   Diagnosis Date Noted  . 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 Cytotoxic brain edema ([place] 12/20/2017  . Hydrocephalus 12/20/2017  . [place] infarct ([place] 12/19/2017  . Encounter for central line placement   . Nausea vomiting and diarrhea     Past Medical History:  Past Medical History:  Diagnosis Date  . Asthma   . Depression   . Hyperlipemia   . Hypertension   . Migraines   . Obesity   . Sciatica    Past Surgical History: History reviewed. No pertinent surgical history.  Assessment & Plan Clinical Impression: Patient is a 52y.o. right handed female with history of migraine headaches, obesity, hyperlipidemia, hypertension and chronic low back pain maintained on Lidoderm patch. Per chart review patient lives with significant other. Independent prior to admission. One level home with 5 steps to entry. Significant other is an UMining engineerand has flexibility and work schedule. Presented 12/19/2017 with persistent dizziness, fall as well as nausea vomiting. CT of the head showed acute infarcts in the left more than right cerebellum with posterior fossa mass effect and foramen magnum stenosis. CTA of head with no large vessel occlusion or stenosis. Patient did not receive TPA. 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 No  gross hemorrhage. MRA showed anterior communicating artery 3 mm prominent infundibulum versus aneurysm. Stable right distal V4 occlusion. No new large vessel occlusion. Neurosurgery Dr. DSherley Boundsconsulted for review of CTA no surgical intervention at this time. Patient was placed on 3% saline and since discontinued. Latest follow-up cranial CT scan 12/23/2017 showed continued internal evolution of left larger than right bilateral cerebellar infarcts no evidence for hemorrhagic transformation. Noted associated mild obstructive hydrocephalus slightly improved from previous exam. Echocardiogram with ejection fraction of 612%grade 1 diastolic dysfunction. Neurology consulted presently on aspirin for CVA prophylaxis. Tolerating a regular diet. Bouts of hypokalemia with supplement added.  Patient transferred to CIR on 12/23/2017 .   Patient currently requires supervision with mobility secondary to muscle weakness, decreased cardiorespiratoy endurance, decreased motor planning, decreased visual acuity and decreased visual perceptual skills, central origin and peripheral and decreased standing balance, decreased postural control, hemiplegia and decreased balance strategies.  Prior to hospitalization, patient was independent  with mobility and lived with   SO in a [place].  Home access is 5Stairs to enter.  Patient will benefit from skilled PT intervention to maximize safe functional mobility, minimize fall risk and decrease caregiver burden for planned discharge home with intermittent assist.  Anticipate patient will benefit from follow up OP at discharge.  PT - End of Session Activity Tolerance: Tolerates 10 - 20 min activity with multiple rests Endurance Deficit: Yes PT Assessment Rehab Potential (ACUTE/IP ONLY): Good PT Barriers to Discharge: Inaccessible home environment;Decreased caregiver support;Home environment access/layout PT Patient demonstrates impairments in the following area(s):  Balance;Edema;Endurance;Motor;Nutrition;Pain;Safety PT Transfers Functional Problem(s): Bed Mobility;Bed to Chair;Car;Floor;Furniture PT Locomotion Functional Problem(s): Ambulation;Wheelchair Mobility;Stairs PT  Plan PT Intensity: Minimum of 1-2 x/day ,45 to 90 minutes PT Frequency: 5 out of 7 days PT Duration Estimated Length of Stay: 5-7 days  PT Treatment/Interventions: Ambulation/gait training;Balance/vestibular training;Cognitive remediation/compensation;Community reintegration;Discharge planning;Disease management/prevention;Functional electrical stimulation;Functional mobility training;DME/adaptive equipment instruction;Pain management;Neuromuscular re-education;Patient/family education;Psychosocial support;Skin care/wound management;Splinting/orthotics;Therapeutic Activities;Therapeutic Exercise;Stair training;UE/LE Strength taining/ROM;Wheelchair propulsion/positioning;UE/LE Coordination activities;Visual/perceptual remediation/compensation PT Transfers Anticipated Outcome(s): Mod I with LRAD  PT Locomotion Anticipated Outcome(s): Ambulatory at mod I level and LRAD  PT Recommendation Recommendations for Other Services: Therapeutic Recreation consult Therapeutic Recreation Interventions: Outing/community reintergration Follow Up Recommendations: Outpatient PT Patient destination: Home Equipment Recommended: Rolling walker with 5" wheels  Skilled Therapeutic Intervention Session 1.  Pt received supine in bed and agreeable to PT. Supine>sit transfer wit supervision assist for safety.  PT instructed patient in PT Evaluation and initiated treatment intervention; see below for results.  PT educated patient in [place], rehab potential, rehab goals, and discharge recommendations.   Pt returned to room and performed stand pivot  transfer to bed with supervision assist. Sit>supine completed with supervision assist, and left supine in bed with call bell in reach and all needs met.    Session 2.   Gait training with rollator 150f x 2 with supervision assist.   nustep reciprocal movement training x 8 min with min cues for full ROM and   PT instructed pt in Berg balance test. Patient demonstrates increased fall risk as noted by score of  39 /56 on Berg Balance Scale.  (<36= high risk for falls, close to 100%; 37-45 significant >80%; 46-51 moderate >50%; 52-55 lower >25%) Pt noted to have increased dizziness only with rotational head movement. Suspect BPPV in addition al central origin dizziness. Pt instructed in gaze stabilization exercises.   Pt returned to room and performed ambulatory transfer to bed with rollator and supervision assist. Sit>supine completed with supervision assist, and pt  left supine in bed with call bell in reach and all needs met.    PT Evaluation Precautions/Restrictions Precautions Precautions: Fall Restrictions Weight Bearing Restrictions: No General   Vital Signs Pain Pain Assessment Pain Type: Chronic pain Pain Location: Back Pain Descriptors / Indicators: Aching Home Living/Prior Functioning Home Living Available Help at Discharge: Family;Available 24 hours/day Type of Home: Apartment Home Access: Stairs to enter Entrance Stairs-Number of Steps: 5 Entrance Stairs-Rails: Right Home Layout: One level Bathroom Shower/Tub: TProduct/process development scientist Standard Bathroom Accessibility: Yes Prior Function Level of Independence: Needs assistance with ADLs(intermittant assist with footwear from SO)  Able to Take Stairs?: Yes Vocation: On disability Comments: Pt reports she has back pain that limits her at times, and she was considering the purchase of a rollator.  Limited driving, does not work, does household chores, cooking.  Vision/Perception  Vision - Assessment Additional Comments: (dizziness when looking L) Perception Perception: Impaired Praxis Praxis: Intact  Cognition Overall Cognitive Status: Within Functional Limits  for tasks assessed Attention: Selective Selective Attention: Appears intact Memory: Appears intact Safety/Judgment: Appears intact Sensation Sensation Light Touch: Appears Intact Hot/Cold: Appears Intact Proprioception: Appears Intact Coordination Gross Motor Movements are Fluid and Coordinated: Yes Fine Motor Movements are Fluid and Coordinated: Yes Finger Nose Finger Test: WCommunity [facility] Monterey PeninsulaHeel Shin Test: WBaptist Physicians Surgery CenterMotor  Motor Motor: Other (comment)(mild) Motor - Skilled Clinical Observations: mild strength deficits  Mobility Bed Mobility Bed Mobility: Rolling Right;Rolling Left;Supine to Sit;Sit to Supine Rolling Right: 5: Supervision Rolling Left: 5: Supervision Supine to Sit: 5: Supervision Sit to Supine: 5: Supervision Transfers Transfers: Yes Sit to Stand: 5: Supervision Stand to  Sit: 5: Supervision Stand Pivot Transfers: 5: Supervision Stand Pivot Transfer Details: Verbal cues for safe use of DME/AE;Verbal cues for precautions/safety Locomotion  Ambulation Ambulation: Yes Ambulation/Gait Assistance: 5: Supervision Ambulation Distance (Feet): 150 Feet Assistive device: Rolling walker;4-wheeled walker Gait Gait: Yes Gait Pattern: Impaired Gait Pattern: Wide base of support Stairs / Additional Locomotion Stairs: Yes Stair Management Technique: Two rails Number of Stairs: 8 Height of Stairs: 6 Wheelchair Mobility Wheelchair Mobility: Yes Wheelchair Assistance: 5: Careers information officer: Both upper extremities Wheelchair Parts Management: Needs assistance Distance: 137f   Trunk/Postural Assessment  Cervical Assessment Cervical Assessment: Exceptions to WFL(forward head) Thoracic Assessment Thoracic Assessment: Within Functional Limits Lumbar Assessment Lumbar Assessment: Exceptions to WFL(poor rotation ) Postural Control Postural Control: Within Functional Limits  Balance Balance Balance Assessed: Yes Standardized Balance Assessment Standardized Balance  Assessment: Berg Balance Test Berg Balance Test Sit to Stand: Able to stand without using hands and stabilize independently Standing Unsupported: Able to stand safely 2 minutes Sitting with Back Unsupported but Feet Supported on Floor or Stool: Able to sit safely and securely 2 minutes Stand to Sit: Controls descent by using hands Transfers: Able to transfer safely, definite need of hands Standing Unsupported with Eyes Closed: Able to stand 10 seconds with supervision Standing Ubsupported with Feet Together: Able to place feet together independently and stand for 1 minute with supervision From Standing, Reach Forward with Outstretched Arm: Can reach confidently >25 cm (10") From Standing Position, Pick up Object from Floor: Able to pick up shoe, needs supervision From Standing Position, Turn to Look Behind Over each Shoulder: Turn sideways only but maintains balance Turn 360 Degrees: Needs assistance while turning Standing Unsupported, Alternately Place Feet on Step/Stool: Able to complete >2 steps/needs minimal assist Standing Unsupported, One Foot in Front: Able to take small step independently and hold 30 seconds Standing on One Leg: Able to lift leg independently and hold 5-10 seconds Total Score: 39 Static Sitting Balance Static Sitting - Level of Assistance: 6: Modified independent (Device/Increase time) Dynamic Sitting Balance Dynamic Sitting - Level of Assistance: 5: Stand by assistance Static Standing Balance Static Standing - Level of Assistance: 5: Stand by assistance Dynamic Standing Balance Dynamic Standing - Level of Assistance: 4: Min assist(no UE support ) Extremity Assessment  RUE Assessment RUE Assessment: Exceptions to WFL(generalized weakness) LUE Assessment LUE Assessment: Exceptions to WFL(generalized weakness) RLE Assessment RLE Assessment: Within Functional Limits LLE Assessment LLE Assessment: Within Functional Limits   See Function Navigator for Current  Functional Status.   Refer to Care Plan for Long Term Goals  Recommendations for other services: Therapeutic Recreation  Outing/community reintegration  Discharge Criteria: Patient will be discharged from PT if patient refuses treatment 3 consecutive times without medical reason, if treatment goals not met, if there is a change in medical status, if patient makes no progress towards goals or if patient is discharged from hospital.  The above assessment, treatment plan, treatment alternatives and goals were discussed and mutually agreed upon: by patient and by family  ALorie Phenix2/07/2018, 11:01 AM

## 2017-12-24 NOTE — Progress Notes (Signed)
[facility]     PROGRESS NOTE  Subjective/Complaints:  Patient seen lying in bed this morning with a washcloth over her head. She states she has a headache this morning and just asked for pain medications. She states she slept well overnight. She is ready to begin her day therapies.  ROS: + Headache. Denies CP, SOB, nausea, vomiting, diarrhea.  Objective: Vital Signs: Blood pressure (!) 150/62, pulse 75, temperature 98.2 F (36.8 C), temperature source Oral, resp. rate 18, height 5\' 3"  (1.6 m), weight 131.7 kg (290 lb 5.5 oz), last menstrual period 12/11/2017, SpO2 97 %. Ct Head Wo Contrast  Result Date: 12/23/2017 CLINICAL DATA:  Follow-up examination for stroke with hydrocephalus. EXAM: CT HEAD WITHOUT CONTRAST TECHNIQUE: Contiguous axial images were obtained from the base of the skull through the vertex without intravenous contrast. COMPARISON:  Prior CT from 12/21/2017. FINDINGS: Brain: Continued interval evolution in bilateral cerebellar infarcts, left larger than right no evidence for hemorrhagic transformation. Associated mass effect within the posterior fossa with secondary partial effacement of the fourth ventricle and quadrigeminal plate cistern, greater on the left. Mild mass effect on the adjacent brainstem again noted. Associated inferior tonsillar herniation noted. Slightly decreased ventricular dilatation of the lateral and third ventricles as compared to previous, most evident at the temporal horns. No new intracranial infarct. No acute intracranial hemorrhage. No mass lesion or midline shift. No extra-axial fluid collection. Vascular: Calcified atherosclerosis at the skull base. No worrisome hyperdense vessel. Skull: Scalp soft tissues and calvarium are unchanged and within normal limits. Sinuses/Orbits: Globes orbital soft tissues within normal limits. Scattered paranasal sinus disease similar to previous. Trace opacity within the right mastoid air  cells noted. Other: None. IMPRESSION: 1. Continued interval evolution of left larger than right bilateral cerebellar infarcts with associated mass effect and inferior tonsillar herniation. Associated mild obstructive hydrocephalus is slightly improved from previous exam. No evidence for hemorrhagic transformation. 2. No other new acute intracranial abnormality. Electronically Signed   By: Rise Mu M.D.   On: 12/23/2017 07:45   No results for input(s): WBC, HGB, HCT, PLT in the last 72 hours. Recent Labs    12/22/17 0347  12/23/17 0908 12/23/17 1442  NA 136   < > 135 134*  K 3.7  --   --   --   CL 95*  --   --   --   GLUCOSE 67  --   --   --   BUN 8  --   --   --   CREATININE 0.56  --   --   --   CALCIUM 8.6*  --   --   --    < > = values in this interval not displayed.   CBG (last 3)  No results for input(s): GLUCAP in the last 72 hours.  Wt Readings from Last 3 Encounters:  12/24/17 131.7 kg (290 lb 5.5 oz)  12/23/17 (!) 137.4 kg (302 lb 14.6 oz)  11/12/16 133.2 kg (293 lb 9 oz)    Physical Exam:  BP (!) 150/62 (BP Location: Left Arm)   Pulse 75   Temp 98.2 F (36.8 C) (Oral)   Resp 18   Ht 5\' 3"  (1.6 m)   Wt 131.7 kg (290 lb 5.5 oz)   LMP 12/11/2017 (Exact Date)   SpO2 97%   BMI 51.43 kg/m  Constitutional: She appearswell-developedand well-nourished. +Distressed HENT: Normocephalicand atraumatic.  Eyes:EOMare normal. No discharge.  Cardiovascular: Normal rate,regular rhythm. No JVD  Respiratory:Breath sounds normal. Norespiratory distress.  GI: Bowel sounds are normal. She exhibitsno distension.  Musculoskeletal:She exhibits noedema, no tenderness. Skin. Warm and dry Neurological.Alert and oriented. Mild left upper ataxia.  Motor: Grossly 5/5 throughout Speech is clear.  Normal insight and awareness Sensation intact to light touch Psych: pleasant and appropriate  Assessment/Plan: 1. 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 Physiatrist is providing close team supervision and 24 hour management of active medical problems listed below. Physiatrist and rehab team continue to assess barriers to discharge/monitor patient progress toward functional and medical goals.  Function:  Bathing Bathing position      Bathing parts      Bathing assist        Upper Body Dressing/Undressing Upper body dressing                    Upper body assist        Lower Body Dressing/Undressing Lower body dressing                                  Lower body assist        Toileting Toileting Toileting activity did not occur: N/A Toileting steps completed by patient: Adjust clothing prior to toileting, Performs perineal hygiene, Adjust clothing after toileting   Toileting Assistive Devices: Grab bar or rail  Toileting assist Assist level: Touching or steadying assistance (Pt.75%)   Transfers Chair/bed transfer   Chair/bed transfer method: Stand pivot Chair/bed transfer assist level: Touching or steadying assistance (Pt > 75%) Chair/bed transfer assistive device: Scientist, physiological          Cognition Comprehension Comprehension assist level: Follows basic conversation/direction with no assist  Expression Expression assist level: Expresses basic needs/ideas: With no assist  Social Interaction Social Interaction assist level: Interacts appropriately 75 - 89% of the time - Needs redirection for appropriate language or to initiate interaction.  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 DVT Prophylaxis/Anticoagulation: SCDs. Monitor for any signs of DVT 3. Pain Management/migraine headaches/chronic back pain:Lidoderm patch,Fioricet for headaches,Ultram as needed 4. Mood:Prozac 40 mg twice a day 5. Neuropsych: This patientiscapable of making decisions on herown behalf. 6. Skin/Wound Care:Routine skin checks 7. Fluids/Electrolytes/Nutrition:Routine I&O's 8.Hypertension. Toprol-XL 25 mg daily, HCTZ 25 mg daily, Lasix 20 mg twice a day, Cardizem 120 mg every 6 hours.    Monitor with increased activity, adjust dosing as necessary 9.Hyperlipidemia. 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 Dietary follow-up 11. Acute blood loss anemia   Hemoglobin 9.7 on 2/6   Labs ordered for Monday 12. Hyponatremia   Sodium 134 on 2/8   Labs ordered for Monday  LOS (Days) 1 A FACE TO FACE EVALUATION WAS PERFORMED  Karanveer Ramakrishnan Karis Juba 12/24/2017 11:06 AM

## 2017-12-24 NOTE — H&P (Addendum)
Physical Medicine and Rehabilitation Admission H&P       Chief Complaint  Patient presents with  . Cerebrovascular Accident  : HPI: Nicole [name] a 52 y.o.right handed femalewith history of migraine headaches, obesity, hyperlipidemia, hypertension and chronic low back pain maintained on Lidoderm patch. Per chart review patient lives with significant other. Independent prior to admission. One level home with 5 steps to entry. Significant other is an Mining engineer and has flexibility and work schedule. Presented 12/19/2017 with persistent dizziness, fall as well as nausea vomiting. CT of the head showed acute infarcts in the left more than right cerebellum with posterior fossa mass effect and foramen magnum stenosis. CTA of head with no large vessel occlusion or stenosis. Patient did not receive TPA. 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 No gross hemorrhage. MRA showed anterior communicating artery 3 mm prominent infundibulum versus aneurysm. Stable right distal V4 occlusion. No new large vessel occlusion. Neurosurgery Dr. Sherley Bounds consulted for review of CTA no surgical intervention at this time. Patient was placed on 3% saline and since discontinued. Latest follow-up cranial CT scan 12/23/2017 showed continued internal evolution of left larger than right bilateral cerebellar infarcts no evidence for hemorrhagic transformation. Noted associated mild obstructive hydrocephalus slightly improved from previous exam.  Echocardiogram with ejection fraction of 25% grade 1 diastolic dysfunction. Neurology consulted presently on aspirin for CVA prophylaxis. Tolerating a regular diet. Bouts of hypokalemia with supplement added. Physical and occupational therapy evaluations completed with recommendations of physical  medicine rehabilitation consult. Patient was admitted for a comprehensive rehabilitation program    Review of Systems  Constitutional: Negative for chills and fever.  HENT: Negative for hearing loss.   Eyes: Positive for blurred vision.  Respiratory: Negative for cough and shortness of breath.        Occasional shortness of breath with exertion  Cardiovascular: Negative for chest pain, palpitations and leg swelling.  Gastrointestinal: Positive for constipation, nausea and vomiting.  Genitourinary: Negative for dysuria, flank pain and hematuria.  Musculoskeletal: Positive for back pain and myalgias.  Skin: Negative for rash.  Neurological: Positive for dizziness and headaches. Negative for seizures.  Psychiatric/Behavioral: Positive for depression.  All other systems reviewed and are negative.      Past Medical History:  Diagnosis Date  . Asthma   . Depression   . Hyperlipemia   . Hypertension   . Migraines   . Obesity   . Sciatica    History reviewed. No pertinent surgical history. No family history on file. Social History:  reports that  has never smoked. she has never used smokeless tobacco. She reports that she does not drink alcohol or use drugs. Allergies:       Allergies  Allergen Reactions  . Asa [Aspirin] Other (See Comments)    Contraindication per provider - pt states not allergic, PCP stated did not go with treatment regimen, ok for pt to take medication  . Ibuprofen Nausea Only  . Morphine And Related Palpitations         Medications Prior to Admission  Medication Sig Dispense Refill  . atorvastatin (LIPITOR) 40 MG tablet Take 40 mg by mouth daily.    Marland Kitchen diltiazem (CARDIZEM CD) 240 MG 24 hr capsule Take 240 mg by mouth 2 (two) times daily.     Marland Kitchen FLUoxetine (PROZAC) 20 MG tablet Take 40  mg by mouth 2 (two) times daily.     . furosemide (LASIX) 20 MG tablet Take 20 mg by mouth 2 (two) times daily.    . hydrochlorothiazide (HYDRODIURIL)  25 MG tablet Take 25 mg by mouth daily.    Marland Kitchen lidocaine (LIDODERM) 5 % Place 1 patch onto the skin daily. Remove & Discard patch within 12 hours or as directed by MD 30 patch 0  . metoprolol succinate (TOPROL-XL) 25 MG 24 hr tablet Take 25 mg by mouth daily.  5  . omeprazole (PRILOSEC) 20 MG capsule Take 20 mg by mouth at bedtime.    Marland Kitchen zolpidem (AMBIEN) 5 MG tablet Take 5 mg by mouth at bedtime as needed for sleep.      Drug Regimen Review Drug regimen was reviewed and remains appropriate with no significant issues identified  Home: Home Living Family/patient expects to be discharged to:: Private residence Living Arrangements: Spouse/significant other(significant other is an Mining engineer and has flexibility) Available Help at Discharge: Family, Available 24 hours/day Type of Home: Apartment Home Access: Stairs to enter [employer] of Steps: 5 Entrance Stairs-Rails: Right Home Layout: One level Bathroom Shower/Tub: Tub/shower unit, Architectural technologist: Standard Home Equipment: None(was looking at getting a rollator due to chronic back pain)   Functional History: Prior Function Level of Independence: Independent Comments: Pt reports she has back pain that limits her at times, and she was considering the purchase of a rollator.  Limited driving, does not work, does household chores, cooking.   Functional Status:  Mobility: Bed Mobility Overal bed mobility: Needs Assistance Bed Mobility: Supine to Sit, Sit to Supine Supine to sit: Supervision, HOB elevated Sit to supine: Supervision General bed mobility comments: received in chair Transfers Overall transfer level: Needs assistance Equipment used: 2 person hand held assist Transfers: Sit to/from Stand Sit to Stand: +2 physical assistance, Min assist Stand pivot transfers: +2 physical assistance, Min assist General transfer comment: 2 person HHA for stability and safety upon elevation to upright. Noted  left sway. VCs for hand placement and targeting Ambulation/Gait Ambulation/Gait assistance: Min assist, Mod assist, +2 physical assistance Ambulation Distance (Feet): 3 Feet Assistive device: 2 person hand held assist Gait Pattern/deviations: Step-to pattern, Leaning posteriorly, Drifts right/left General Gait Details: patient requires 2 person min assist with one episode of moderate assist upon fatigue for LOB to the left. Patient performed ambulation in 40 ft trials with cues for targeting. decreased Gait velocity: non functional speed at this time Gait velocity interpretation: <1.8 ft/sec, indicative of risk for recurrent falls  ADL: ADL Overall ADL's : Needs assistance/impaired Eating/Feeding: Modified independent, Bed level Grooming: Wash/dry hands, Wash/dry face, Oral care, Brushing hair, Minimal assistance, Sitting Upper Body Bathing: Moderate assistance, Sitting Lower Body Bathing: Maximal assistance, Sit to/from stand Upper Body Dressing : Maximal assistance, Sitting Lower Body Dressing: Total assistance, Sit to/from stand Toilet Transfer: Minimal assistance, +2 for physical assistance, Stand-pivot, BSC Toileting- Clothing Manipulation and Hygiene: Total assistance, Sit to/from stand Functional mobility during ADLs: Minimal assistance, +2 for physical assistance General ADL Comments: limited by severe HA and dizziness   Cognition: Cognition Overall Cognitive Status: Within Functional Limits for tasks assessed Orientation Level: Oriented X4 Cognition Arousal/Alertness: Awake/alert Behavior During Therapy: Anxious(fearful of pain, fearful of the stroke, and fearful of movin) Overall Cognitive Status: Within Functional Limits for tasks assessed  Physical Exam: Blood pressure (!) 158/89, pulse 87, temperature 98.1 F (36.7 C), temperature source Oral, resp. rate (!) 22, height _0  (1.6 m), weight  134.1 kg (295 lb 10.2 oz), last menstrual period 12/11/2017, SpO2 95  %. Physical Exam  Vitals reviewed. Constitutional: She appears well-developed and well-nourished.  HENT:  Head: Normocephalic and atraumatic.  Eyes: EOM are normal. Right eye exhibits no discharge. Left eye exhibits no discharge.  Neck: Normal range of motion. Neck supple. No JVD present. No thyromegaly present.  Cardiovascular: Normal rate, regular rhythm and normal heart sounds. Exam reveals no friction rub.  No murmur heard. Respiratory: Breath sounds normal. No respiratory distress. She has no wheezes.  GI: Soft. Bowel sounds are normal. She exhibits no distension. There is no tenderness. There is no rebound.  Musculoskeletal: Normal range of motion. She exhibits no edema.  Skin. Warm and dry Neurological. Alert and appropriate. No focal CN findings. Became dizzy and a few beats of lateral nystagmus with head turning to the left. Mild left upper and lower limb ataxia. Strength grossly 4+ to 5/5 RUE and RLE and 4 to 4+/5 LUE and LLE. Sensory function intact. Speech is clear. Normal insight and awareness Psych: pleasant and appropriate     LabResultsLast48Hours        Results for orders placed or performed during the hospital encounter of 12/18/17 (from the past 48 hour(s))  Sodium     Status: None   Collection Time: 12/20/17  9:07 AM  Result Value Ref Range   Sodium 140 135 - 145 mmol/L    Comment: Performed at [facility], [place] [address], [place], [place] [address]  Sodium     Status: None   Collection Time: 12/20/17  2:53 PM  Result Value Ref Range   Sodium 145 135 - 145 mmol/L    Comment: Performed at [facility], [place] [address], [place], [place] [address]  Sodium     Status: None   Collection Time: 12/20/17  9:42 PM  Result Value Ref Range   Sodium 138 135 - 145 mmol/L    Comment: Performed at [facility], [place] [address], [place], [place] [address]  CBC     Status: Abnormal   Collection Time: 12/21/17  3:54 AM  Result Value Ref  Range   WBC 10.2 4.0 - 10.5 K/uL   RBC 4.02 3.87 - 5.11 MIL/uL   Hemoglobin 9.7 (L) 12.0 - 15.0 g/dL   HCT 32.9 (L) 36.0 - 46.0 %   MCV 81.8 78.0 - 100.0 fL   MCH 24.1 (L) 26.0 - 34.0 pg   MCHC 29.5 (L) 30.0 - 36.0 g/dL   RDW 14.6 11.5 - 15.5 %   Platelets 196 150 - 400 K/uL    Comment: Performed at [facility], [place] [address], [place], [place] [address]  Basic metabolic panel     Status: Abnormal   Collection Time: 12/21/17  3:54 AM  Result Value Ref Range   Sodium 137 135 - 145 mmol/L   Potassium 3.1 (L) 3.5 - 5.1 mmol/L   Chloride 102 101 - 111 mmol/L   CO2 25 22 - 32 mmol/L   Glucose, Bld 108 (H) 65 - 99 mg/dL   BUN 7 6 - 20 mg/dL   Creatinine, Ser 0.57 0.44 - 1.00 mg/dL   Calcium 8.4 (L) 8.9 - 10.3 mg/dL   GFR calc non Af Amer >60 >60 mL/min   GFR calc Af Amer >60 >60 mL/min    Comment: (NOTE) The eGFR has been calculated using the CKD EPI equation. This calculation has not been validated in all  clinical situations. eGFR's persistently <60 mL/min signify possible Chronic Kidney Disease.    Anion gap 10 5 - 15    Comment: Performed at [facility] [address] [place], [place] [address]  Sodium     Status: None   Collection Time: 12/21/17 11:02 AM  Result Value Ref Range   Sodium 138 135 - 145 mmol/L    Comment: Performed at [facility] [address] [place], [place] [address]  Sodium     Status: None   Collection Time: 12/21/17  2:43 PM  Result Value Ref Range   Sodium 138 135 - 145 mmol/L    Comment: Performed at [facility] [address] [place], [place] [address]  Sodium     Status: None   Collection Time: 12/22/17 12:11 AM  Result Value Ref Range   Sodium 136 135 - 145 mmol/L    Comment: Performed at [facility] [address], [place], [place] [address]  Basic metabolic panel     Status: Abnormal   Collection Time: 12/22/17  3:47 AM  Result Value Ref Range   Sodium 136  135 - 145 mmol/L   Potassium 3.7 3.5 - 5.1 mmol/L   Chloride 95 (L) 101 - 111 mmol/L   CO2 27 22 - 32 mmol/L   Glucose, Bld 67 65 - 99 mg/dL   BUN 8 6 - 20 mg/dL   Creatinine, Ser 0.56 0.44 - 1.00 mg/dL   Calcium 8.6 (L) 8.9 - 10.3 mg/dL   GFR calc non Af Amer >60 >60 mL/min   GFR calc Af Amer >60 >60 mL/min    Comment: (NOTE) The eGFR has been calculated using the CKD EPI equation. This calculation has not been validated in all clinical situations. eGFR's persistently <60 mL/min signify possible Chronic Kidney Disease.    Anion gap 14 5 - 15    Comment: Performed at [facility] [address], [place],  [address]      ImagingResults(Last48hours)  Ct Head Wo Contrast  Result Date: 12/21/2017 CLINICAL DATA:  Stroke follow-up. EXAM: CT HEAD WITHOUT CONTRAST TECHNIQUE: Contiguous axial images were obtained from the base of the skull through the vertex without intravenous contrast. COMPARISON:  Brain MRI and CT 12/19/2017 FINDINGS: Brain: A large left PICA infarct and additional smaller bilateral cerebellar infarcts are unchanged from the prior studies. Associated edema and posterior fossa mass effect resulting in partial effacement of the fourth ventricle and quadrilateral plate cistern and mass effect on the brainstem are unchanged. Inferior tonsillar herniation and mild dilatation of the lateral ventricles are unchanged. There is no evidence of acute supratentorial infarct. No extra-axial fluid collection or acute hemorrhage is identified. Vascular: Calcified atherosclerosis at the skull base, most notably involving the left vertebral artery. Skull: No fracture or focal osseous lesion. Sinuses/Orbits: Large left maxillary sinus mucous retention cyst with mild mucosal thickening scattered throughout the paranasal sinuses bilaterally. Clear mastoid air cells. Unremarkable orbits. Other: None. IMPRESSION: Unchanged edema and mass effect associated with acute  left larger than right cerebellar infarcts. Unchanged inferior tonsillar herniation and mild obstructive hydrocephalus. No acute hemorrhage. Electronically Signed   By: Logan Bores M.D.   On: 12/21/2017 07:25        Medical Problem List and Plan: 1.  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  DVT Prophylaxis/Anticoagulation: SCDs. Monitor for any signs of DVT 3. Pain Management/migraine headaches/chronic back pain: Lidoderm patch, Fioricet for headaches, Ultram as needed 4. Mood: Prozac 40 mg twice a day 5. Neuropsych: This patient is capable of making decisions on her own behalf. 6. Skin/Wound Care: Routine skin checks 7. Fluids/Electrolytes/Nutrition: Routine I&O's with follow-up chemistries 8. Hypertension. Toprol-XL 25 mg daily, HCTZ 25 mg daily, Lasix 20 mg twice a day, Cardizem 120 mg every 6 hours. Monitor with activity, adjust dosing as necessary 9. Hyperlipidemia. Lipitor 10. Obesity. BMI 53.27. Dietary follow-up   Post Admission Physician Evaluation: 1. Functional deficits secondary  to left PICA infarct. 2. Patient is admitted to receive collaborative, interdisciplinary care between the physiatrist, rehab nursing staff, and therapy team. 3. Patient's level of medical complexity and substantial therapy needs in context of that medical necessity cannot be provided at a lesser intensity of care such as a SNF. 4. Patient has experienced substantial functional loss from his/her baseline which was documented above under the "Functional History" and "Functional Status" headings.  Judging by the patient's diagnosis, physical exam, and functional history, the patient has potential for functional progress which will result in measurable gains while on inpatient rehab.  These gains will be of substantial and practical use upon discharge  in facilitating mobility and self-care at the household  level. 5. Physiatrist will provide 24 hour management of medical needs as well as oversight of the therapy plan/treatment and provide guidance as appropriate regarding the interaction of the two. 6. The Preadmission Screening has been reviewed and patient status is unchanged unless otherwise stated above. 7. 24 hour rehab nursing will assist with bladder management, bowel management, safety, skin/wound care, disease management, medication administration, pain management and patient education  and help integrate therapy concepts, techniques,education, etc. 8. PT will assess and treat for/with: Lower extremity strength, range of motion, stamina, balance, functional mobility, safety, adaptive techniques and equipment, vestibular rx, NMR.   Goals are: mod I. 9. OT will assess and treat for/with: ADL's, functional mobility, safety, upper extremity strength, adaptive techniques and equipment, NMR, vestibular rx, community reentry.   Goals are: mod I. Therapy may proceed with showering this patient. 10. SLP will assess and treat for/with: n/a.  Goals are: n/a. 11. Case Management and Social Worker will assess and treat for psychological issues and discharge planning. 12. Team conference will be held weekly to assess progress toward goals and to determine barriers to discharge. 13. Patient will receive at least 3 hours of therapy per day at least 5 days per week. 14. ELOS: 5-7 days       15. Prognosis:  excellent     Meredith Staggers, MD, [facility] 12/23/2017  Lavon Paganini [place], PA-C 12/22/2017  This exam and report was completed on 12/23/17 and were moved to the rehab encounter for charting purposes.

## 2017-12-24 NOTE — Evaluation (Signed)
Occupational Therapy Assessment and Plan  Patient Details  Name: Nicole [name] MRN: [id number] Date of Birth: 1966/05/02  OT Diagnosis: acute pain, lumbago (low back pain) and muscle weakness (generalized) Rehab Potential:   ELOS: 5-7   Today's Date: 12/24/2017 OT Individual Time:  -        Problem List:  Patient Active Problem List   Diagnosis Date Noted  . 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 Hydrocephalus 12/20/2017  . New cerebellar infarct ([place]) 12/19/2017  . Encounter for central line placement   . Nausea vomiting and diarrhea     Past Medical History:  Past Medical History:  Diagnosis Date  . Asthma   . Depression   . Hyperlipemia   . Hypertension   . Migraines   . Obesity   . Sciatica    Past Surgical History: History reviewed. No pertinent surgical history.  Assessment & Plan Clinical Impression: Patient  is a 52 y.o. right handed female with history of migraine headaches, obesity, hyperlipidemia, hypertension and chronic low back pain maintained on Lidoderm patch. Per chart review patient lives with significant other. Independent prior to admission. One level home with 5 steps to entry. Significant other is an Mining engineer and has flexibility and work schedule. Presented 12/19/2017 with persistent dizziness, fall as well as nausea vomiting. CT of the head showed acute infarcts in the left more than right cerebellum with posterior fossa mass effect and foramen magnum stenosis. CTA of head with no large vessel occlusion or stenosis. Patient did not receive TPA. 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 No gross hemorrhage. MRA showed anterior communicating artery 3 mm prominent infundibulum versus aneurysm.  Stable right distal V4 occlusion. No new large vessel occlusion. Neurosurgery Dr. Sherley Bounds consulted for review of CTA no surgical intervention at this time. Patient was placed on 3% saline and since discontinued. Latest follow-up cranial CT scan 12/23/2017 showed continued internal evolution of left larger than right bilateral cerebellar infarcts no evidence for hemorrhagic transformation. Noted associated mild obstructive hydrocephalus slightly improved from previous exam.  Echocardiogram with ejection fraction of 93% grade 1 diastolic dysfunction. Neurology consulted presently on aspirin for CVA prophylaxis. Tolerating a regular diet. Bouts of hypokalemia with supplement added. Physical and occupational therapy evaluations completed with recommendations of physical medicine rehabilitation consult. Patient was admitted for a comprehensive rehabilitation program  Patient currently requires min with basic self-care skills secondary to muscle weakness, decreased cardiorespiratoy endurance and decreased standing balance and decreased balance strategies.  Prior to hospitalization, patient could complete BADL with independent .  Patient will benefit from skilled intervention to increase independence with basic self-care skills prior to discharge home independently.  Anticipate patient will require intermittent supervision and follow up outpatient.  OT - End of Session Activity Tolerance: Tolerates 30+ min activity with multiple rests Endurance Deficit: Yes OT Assessment Rehab Potential (ACUTE ONLY): Excellent OT Patient demonstrates impairments in the following area(s): Balance;Endurance;Motor;Pain;Safety;Other (Comment)(vestibular) OT Basic ADL's Functional Problem(s): Grooming;Bathing;Dressing;Toileting OT Advanced ADL's Functional Problem(s): Simple Meal Preparation OT Transfers Functional Problem(s): Toilet;Tub/Shower OT Plan OT Intensity: Minimum of 1-2 x/day, 45 to 90 minutes OT Frequency: 5  out of 7 days OT Duration/Estimated Length of Stay: 5-7 OT Treatment/Interventions: Balance/vestibular training;Discharge planning;Pain management;Self Care/advanced ADL retraining;Therapeutic Activities;UE/LE Coordination activities;Disease mangement/prevention;Functional mobility training;Patient/family education;Skin care/wound managment;Therapeutic  Exercise;Visual/perceptual remediation/compensation;Community reintegration;DME/adaptive equipment instruction;Neuromuscular re-education;Psychosocial support;UE/LE Strength taining/ROM;Wheelchair propulsion/positioning OT Self Feeding Anticipated Outcome(s): no goal OT Basic Self-Care Anticipated Outcome(s): MOD I OT Toileting Anticipated Outcome(s): MOD I OT Bathroom Transfers Anticipated Outcome(s): MOD I OT Recommendation Patient destination: Home Follow Up Recommendations: Outpatient OT Equipment Recommended: Tub/shower bench   Skilled Therapeutic Intervention 1:1. Pt reporting migraine, RN aware. Pt requesting to shower still. Pt ambulates with RW with CGA and VC for sequencing of transfer onto TTB in shower and gaze stabilization while turning and walking. Pt bathes at sit to stand level with A to wash back and B feet d/t low back pain. OT dons B socks prior to pt walking to w/c in front of sink in same manner as above. Pt dresses at sit to stand level with CGA for standing balance while advancing pants past hips. Pt reports feeling ill/ nauseas. Pt transfers back to bed with RW with CGA and Vc for safe reach back. Pt supine HOB elevated with emesis episode. RN alerted. Pt reporting feeling "a llittle better" afterwards. Exited session with RN entering room.   OT Evaluation Precautions/Restrictions  Precautions Precautions: Fall Restrictions Weight Bearing Restrictions: No General Chart Reviewed: Yes Vital Signs  Pain Pain Assessment Pain Assessment: 0-10 Pain Score: 10-Worst pain ever Pain Type: Chronic pain Pain Location:  Back Pain Descriptors / Indicators: Aching Home Living/Prior Functioning Home Living Family/patient expects to be discharged to:: Private residence Living Arrangements: Spouse/significant other Available Help at Discharge: Family, Available 24 hours/day Type of Home: Apartment Home Access: Stairs to enter Technical brewer of Steps: 5 Entrance Stairs-Rails: Right Home Layout: One level Bathroom Shower/Tub: Tub/shower unit, Architectural technologist: Standard IADL History Homemaking Responsibilities: Yes 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  Limited driving, does not work, does household chores, cooking.  ADL   Vision Patient Visual Report: Blurring of vision;Other (comment) Additional Comments: (dizziness when looking L) Perception  Perception: Within Functional Limits Praxis Praxis: Intact Cognition Overall Cognitive Status: Within Functional Limits for tasks assessed Orientation Level: Person;Place;Situation Person: Oriented Place: Oriented Situation: Oriented Year: 2019 Month: February Day of Week: Correct Memory: Appears intact Immediate Memory Recall: Sock;Blue;Bed Memory Recall: Sock;Blue;Bed Memory Recall Sock: With Cue Memory Recall Blue: With Cue Memory Recall Bed: With Cue Attention: Selective Selective Attention: Appears intact Safety/Judgment: Appears intact Sensation Sensation Light Touch: Appears Intact Hot/Cold: Appears Intact Proprioception: Appears Intact Coordination Gross Motor Movements are Fluid and Coordinated: Yes Fine Motor Movements are Fluid and Coordinated: Yes Motor  Motor Motor: Hemiplegia(mild) Mobility  Transfers Transfers: Sit to Stand;Stand to Sit Sit  to Stand: 4: Min guard Stand to Sit: 4: Min guard  Trunk/Postural Assessment  Cervical Assessment Cervical Assessment: Within Functional Limits Thoracic Assessment Thoracic Assessment: Within Functional Limits Lumbar Assessment Lumbar Assessment: Within Functional Limits Postural Control Postural Control: Within Functional Limits  Balance Balance Balance Assessed: Yes Extremity/Trunk Assessment RUE Assessment RUE Assessment: Exceptions to WFL(generalized weakness) LUE Assessment LUE Assessment: Exceptions to WFL(generalized weakness)   See Function Navigator for Current Functional Status.   Refer to Care Plan for Long Term Goals  Recommendations for other services: Therapeutic Recreation  Pet therapy, Kitchen group and Outing/community reintegration   Discharge Criteria: Patient will be discharged from OT if patient refuses treatment 3  consecutive times without medical reason, if treatment goals not met, if there is a change in medical status, if patient makes no progress towards goals or if patient is discharged from hospital.  The above assessment, treatment plan, treatment alternatives and goals were discussed and mutually agreed upon: by patient  Tonny Branch 12/24/2017, 9:03 AM

## 2017-12-24 NOTE — Plan of Care (Signed)
  Not Progressing RH BOWEL ELIMINATION RH STG MANAGE BOWEL WITH ASSISTANCE Description STG Manage Bowel with min.Assistance.  No BM > 3 days per patient report. 12/24/2017 0310 - Not Progressing by Martina Sinner, RN RH STG MANAGE BOWEL W/MEDICATION W/ASSISTANCE Description STG Manage Bowel with Medication with mod. I Assistance.  12/24/2017 0310 - Not Progressing by Martina Sinner, RN

## 2017-12-25 ENCOUNTER — Inpatient Hospital Stay (HOSPITAL_COMMUNITY): Admitting: Physical Therapy

## 2017-12-25 DIAGNOSIS — G441 Vascular headache, not elsewhere classified: Secondary | ICD-10-CM

## 2017-12-25 MED ORDER — TRAZODONE HCL 50 MG PO TABS
50.0000 mg | ORAL_TABLET | Freq: Every evening | ORAL | Status: DC | PRN
  Administered 2017-12-25 – 2017-12-27 (×2): 50 mg via ORAL
  Filled 2017-12-25 (×2): qty 1

## 2017-12-25 MED ORDER — MECLIZINE HCL 25 MG PO TABS
25.0000 mg | ORAL_TABLET | Freq: Every day | ORAL | Status: DC | PRN
  Administered 2017-12-25 – 2017-12-26 (×2): 25 mg via ORAL
  Filled 2017-12-25 (×2): qty 1

## 2017-12-25 NOTE — Progress Notes (Signed)
Slept well. Refused to wear CPAP, not comfortable per patient. Without complaint of HA thus far this shift. Nicole [name] A

## 2017-12-25 NOTE — Plan of Care (Signed)
  Not Progressing RH PAIN MANAGEMENT RH STG PAIN MANAGED AT OR BELOW PT'S PAIN GOAL Description <3 on a 0-10 pain scale  Rates pain 8 or more.  12/25/2017 2345 - Not Progressing by Martina Sinner, RN

## 2017-12-25 NOTE — Progress Notes (Signed)
Patient is  crying and complains of on and off chest tightness but no pain . She claims she is  very anxious  due to her dizziness. She claims she is afraid and unable to go to sleep at night. MD notified new orders noted. Per patient hot packs helps with her dizziness.RN  provided hot packs.Will continue to monitor.

## 2017-12-25 NOTE — Progress Notes (Signed)
Woke up with HA-PRN fioricet given. "I tried to tough it out". Encouraged patient to call as soon as head hurts. Complained of feeling dizzy, along with HA.Nicole [name]

## 2017-12-25 NOTE — Progress Notes (Signed)
[facility] PHYSICAL MEDICINE & REHABILITATION     PROGRESS NOTE  Subjective/Complaints:  Patient seen lying in bed this morning.  She states she slept well overnight.  She states she had a great first day of therapy yesterday.  She states she has a headache and dizziness this morning, but just received pain medications.  She states the pain medications relieve the pain.  Family at bedside.  ROS: + Headache, dizziness. Denies CP, SOB, nausea, vomiting, diarrhea.  Objective: Vital Signs: Blood pressure 133/83, pulse 95, temperature 98 F (36.7 C), temperature source Oral, resp. rate 18, height 5\' 3"  (1.6 m), weight 132 kg (291 lb 0.1 oz), last menstrual period 12/11/2017, SpO2 97 %. No results found. No results for input(s): WBC, HGB, HCT, PLT in the last 72 hours. Recent Labs    12/23/17 0908 12/23/17 1442  NA 135 134*   CBG (last 3)  No results for input(s): GLUCAP in the last 72 hours.  Wt Readings from Last 3 Encounters:  12/25/17 132 kg (291 lb 0.1 oz)  12/23/17 (!) 137.4 kg (302 lb 14.6 oz)  11/12/16 133.2 kg (293 lb 9 oz)    Physical Exam:  BP 133/83 (BP Location: Left Arm)   Pulse 95   Temp 98 F (36.7 C) (Oral)   Resp 18   Ht 5\' 3"  (1.6 m)   Wt 132 kg (291 lb 0.1 oz)   LMP 12/11/2017 (Exact Date)   SpO2 97%   BMI 51.55 kg/m  Constitutional: She appearswell-developedand well-nourished. +Distressed HENT: Normocephalicand atraumatic.  Eyes:EOMare normal. No discharge.  Cardiovascular: RRR. No JVD Respiratory:Breath sounds normal. Norespiratory distress.  GI: Bowel sounds are normal. She exhibitsno distension.  Musculoskeletal:She exhibits noedema, no tenderness. Skin. Warm and dry Neurological.Alert and oriented. Mild left upper ataxia.  Motor: Grossly 5/5 throughout (stable) Speech is clear.  Normal insight and awareness Sensation intact to light touch Psych: pleasant and appropriate  Assessment/Plan: 1. Functional deficits secondary to left  PICA infarct which require 3+ hours per day of interdisciplinary therapy in a comprehensive inpatient rehab setting. Physiatrist is providing close team supervision and 24 hour management of active medical problems listed below. Physiatrist and rehab team continue to assess barriers to discharge/monitor patient progress toward functional and medical goals.  Function:  Bathing Bathing position      Bathing parts Body parts bathed by patient: Right arm, Left arm, Chest, Abdomen, Front perineal area, Buttocks, Right upper leg, Left upper leg Body parts bathed by helper: Right lower leg, Left lower leg, Back  Bathing assist        Upper Body Dressing/Undressing Upper body dressing   What is the patient wearing?: Pull over shirt/dress     Pull over shirt/dress - Perfomed by patient: Thread/unthread right sleeve, Thread/unthread left sleeve, Put head through opening, Pull shirt over trunk          Upper body assist        Lower Body Dressing/Undressing Lower body dressing   What is the patient wearing?: Socks, Shoes Underwear - Performed by patient: Thread/unthread left underwear leg, Thread/unthread right underwear leg, Pull underwear up/down   Pants- Performed by patient: Thread/unthread right pants leg, Thread/unthread left pants leg, Pull pants up/down     Non-skid slipper socks- Performed by helper: Don/doff right sock, Don/doff left sock Socks - Performed by patient: Don/doff right sock, Don/doff left sock   Shoes - Performed by patient: Don/doff right shoe, Don/doff left shoe(slip on)  Lower body assist Assist for lower body dressing: Touching or steadying assistance (Pt > 75%)      Toileting Toileting Toileting activity did not occur: N/A Toileting steps completed by patient: Adjust clothing prior to toileting, Performs perineal hygiene, Adjust clothing after toileting   Toileting Assistive Devices: Grab bar or rail  Toileting assist Assist level:  Touching or steadying assistance (Pt.75%)   Transfers Chair/bed transfer   Chair/bed transfer method: Stand pivot Chair/bed transfer assist level: Supervision or verbal cues Chair/bed transfer assistive device: Environmental consultant, Designer, fashion/clothing     Max distance: 125ft  Assist level: Supervision or verbal cues   Wheelchair     Max wheelchair distance: 161ft  Assist Level: Supervision or verbal cues  Cognition Comprehension Comprehension assist level: Follows basic conversation/direction with no assist  Expression Expression assist level: Expresses basic needs/ideas: With no assist  Social Interaction Social Interaction assist level: Interacts appropriately 75 - 89% of the time - Needs redirection for appropriate language or to initiate interaction.  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 DVT Prophylaxis/Anticoagulation: SCDs. Monitor for any signs of DVT 3. Pain Management/migraine headaches/chronic back pain:Lidoderm patch,Fioricet for headaches,Ultram as needed   Will consider Topamax 4. Mood:Prozac 40 mg twice a day 5. Neuropsych: This patientiscapable of making decisions on herown behalf. 6. Skin/Wound Care:Routine skin checks 7. Fluids/Electrolytes/Nutrition:Routine I&O's 8.Hypertension. Toprol-XL 25 mg daily, HCTZ 25 mg daily, Lasix 20 mg twice a day, Cardizem 120 mg every 6 hours.    Controlled on 2/10 9.Hyperlipidemia. 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 Dietary follow-up 11. Acute blood loss anemia   Hemoglobin 9.7 on 2/6   Labs ordered for tomorrow 12. Hyponatremia   Sodium 134 on 2/8   Labs ordered for  tomorrow  LOS (Days) 2 A FACE TO FACE EVALUATION WAS PERFORMED  Sair Faulcon Karis Juba 12/25/2017 1:57 PM

## 2017-12-25 NOTE — Progress Notes (Signed)
Physical Therapy Session Note  Patient Details  Name: Nicole [name] MRN: [id number] Date of Birth: 1966/09/04  Today's Date: 12/25/2017   Short Term Goals: Week 1:  PT Short Term Goal 1 (Week 1): STG =LTG due to ELOS  Skilled Therapeutic Interventions/Progress Updates: Pt received in bed; c/o not feeling well all morning, states she took medication and is finally starting to start feeling a little better and would rather rest at this time. Pt emotional, tearful and frustrated stating "I don't know why I feel this way"; offered emotional support and allowed pt to rest at this time. Remained in bed, all needs in reach. Continue per POC at able.      Therapy Documentation Precautions:  Precautions Precautions: Fall Restrictions Weight Bearing Restrictions: No General: PT Amount of Missed Time (min): 45 Minutes PT Missed Treatment Reason: Patient ill (Comment)  See Function Navigator for Current Functional Status.   Therapy/Group: Individual Therapy  Vista Lawman 12/25/2017, 1:07 PM

## 2017-12-26 ENCOUNTER — Inpatient Hospital Stay (HOSPITAL_COMMUNITY): Admitting: Occupational Therapy

## 2017-12-26 ENCOUNTER — Inpatient Hospital Stay (HOSPITAL_COMMUNITY): Admitting: Physical Therapy

## 2017-12-26 DIAGNOSIS — E876 Hypokalemia: Secondary | ICD-10-CM

## 2017-12-26 DIAGNOSIS — D72829 Elevated white blood cell count, unspecified: Secondary | ICD-10-CM

## 2017-12-26 LAB — CBC WITH DIFFERENTIAL/PLATELET
BASOS ABS: 0.1 10*3/uL (ref 0.0–0.1)
Basophils Relative: 0 %
EOS ABS: 0.4 10*3/uL (ref 0.0–0.7)
Eosinophils Relative: 3 %
HCT: 38.7 % (ref 36.0–46.0)
HEMOGLOBIN: 12.1 g/dL (ref 12.0–15.0)
LYMPHS ABS: 2.8 10*3/uL (ref 0.7–4.0)
Lymphocytes Relative: 24 %
MCH: 24.9 pg — AB (ref 26.0–34.0)
MCHC: 31.3 g/dL (ref 30.0–36.0)
MCV: 79.8 fL (ref 78.0–100.0)
Monocytes Absolute: 0.8 10*3/uL (ref 0.1–1.0)
Monocytes Relative: 7 %
NEUTROS PCT: 66 %
Neutro Abs: 7.7 10*3/uL (ref 1.7–7.7)
Platelets: 346 10*3/uL (ref 150–400)
RBC: 4.85 MIL/uL (ref 3.87–5.11)
RDW: 14.8 % (ref 11.5–15.5)
WBC: 11.8 10*3/uL — AB (ref 4.0–10.5)

## 2017-12-26 LAB — COMPREHENSIVE METABOLIC PANEL
ALT: 17 U/L (ref 14–54)
AST: 17 U/L (ref 15–41)
Albumin: 3.5 g/dL (ref 3.5–5.0)
Alkaline Phosphatase: 100 U/L (ref 38–126)
Anion gap: 16 — ABNORMAL HIGH (ref 5–15)
BUN: 17 mg/dL (ref 6–20)
CHLORIDE: 91 mmol/L — AB (ref 101–111)
CO2: 29 mmol/L (ref 22–32)
CREATININE: 0.88 mg/dL (ref 0.44–1.00)
Calcium: 9.2 mg/dL (ref 8.9–10.3)
GFR calc Af Amer: >60 mL/min (ref >60)
Glucose, Bld: 136 mg/dL — ABNORMAL HIGH (ref 65–99)
POTASSIUM: 2.7 mmol/L — AB (ref 3.5–5.1)
Sodium: 136 mmol/L (ref 135–145)
Total Bilirubin: 0.4 mg/dL (ref 0.3–1.2)
Total Protein: 7.8 g/dL (ref 6.5–8.1)

## 2017-12-26 MED ORDER — POTASSIUM CHLORIDE CRYS ER 20 MEQ PO TBCR: 30.0000 meq | EXTENDED_RELEASE_TABLET | Freq: Three times a day (TID) | ORAL | Status: DC

## 2017-12-26 MED ORDER — MECLIZINE HCL 25 MG PO TABS
12.5000 mg | ORAL_TABLET | Freq: Three times a day (TID) | ORAL | Status: DC | PRN
  Administered 2017-12-27 (×2): 12.5 mg via ORAL
  Filled 2017-12-26 (×2): qty 1

## 2017-12-26 MED ORDER — POTASSIUM CHLORIDE CRYS ER 20 MEQ PO TBCR
30.0000 meq | EXTENDED_RELEASE_TABLET | Freq: Two times a day (BID) | ORAL | Status: DC
  Administered 2017-12-26 – 2017-12-27 (×2): 30 meq via ORAL
  Filled 2017-12-26 (×2): qty 1

## 2017-12-26 MED ORDER — POTASSIUM CHLORIDE CRYS ER 20 MEQ PO TBCR
20.0000 meq | EXTENDED_RELEASE_TABLET | Freq: Three times a day (TID) | ORAL | Status: DC
  Administered 2017-12-26: 20 meq via ORAL
  Filled 2017-12-26: qty 1

## 2017-12-26 NOTE — Progress Notes (Signed)
C/O HA at HS, PRN fioricet given at 2201, along with PRN trazodone, per patient's request. Reports a good nights sleep. At 0526, c/o HA, PRN fioricet given. After ambulating to BR, C/O dizziness, declined PRN antivert- "maybe later." LBM 12/22/17, agreeable to take LOC after last therapy today. Refusing to wear CPAP machine, not comfortable.  RT assisted with machine/mask setup, patient removed. Brought own machine in, need to encourage her to wear. Periods of apnea noted at HS. Spoke with patient about need to wear CPAP. O2 sat this am=92% RA. Alfredo Martinez A

## 2017-12-26 NOTE — Progress Notes (Signed)
[facility] PHYSICAL MEDICINE & REHABILITATION     PROGRESS NOTE  Subjective/Complaints:  Patient seen lying in bed this AM.  Overnight, pt had anxiety and chest pressure.  Meds ordereed.  Pt states she feels much better this AM and slept well overnight.    ROS: Denies dizziness, headaches, CP, SOB, nausea, vomiting, diarrhea.  Objective: Vital Signs: Blood pressure 124/84, pulse 95, temperature 98.2 F (36.8 C), temperature source Oral, resp. rate 16, height 5\' 3"  (1.6 m), weight 129.1 kg (284 lb 9.8 oz), last menstrual period 12/11/2017, SpO2 92 %. No results found. Recent Labs    12/26/17 0503  WBC 11.8*  HGB 12.1  HCT 38.7  PLT 346   Recent Labs    12/23/17 1442 12/26/17 0503  NA 134* 136  K  --  2.7*  CL  --  91*  GLUCOSE  --  136*  BUN  --  17  CREATININE  --  0.88  CALCIUM  --  9.2   CBG (last 3)  No results for input(s): GLUCAP in the last 72 hours.  Wt Readings from Last 3 Encounters:  12/26/17 129.1 kg (284 lb 9.8 oz)  12/23/17 (!) 137.4 kg (302 lb 14.6 oz)  11/12/16 133.2 kg (293 lb 9 oz)    Physical Exam:  BP 124/84 (BP Location: Right Arm)   Pulse 95   Temp 98.2 F (36.8 C) (Oral)   Resp 16   Ht 5\' 3"  (1.6 m)   Wt 129.1 kg (284 lb 9.8 oz)   LMP 12/11/2017 (Exact Date)   SpO2 92%   BMI 50.42 kg/m  Constitutional: She appearswell-developedand well-nourished. +Distressed HENT: Normocephalicand atraumatic.  Eyes:EOMare normal. No discharge.  Cardiovascular: RRR. No JVD Respiratory:Breath sounds normal. Norespiratory distress.  GI: Bowel sounds are normal. She exhibitsno distension.  Musculoskeletal:She exhibits noedema, no tenderness. Skin. Warm and dry Neurological.Alert and oriented. Mild left upper ataxia.  Motor: Grossly 5/5 throughout (unchanged) Speech is clear.  Normal insight and awareness Sensation intact to light touch Psych: pleasant and appropriate  Assessment/Plan: 1. Functional deficits secondary to left PICA  infarct which require 3+ hours per day of interdisciplinary therapy in a comprehensive inpatient rehab setting. Physiatrist is providing close team supervision and 24 hour management of active medical problems listed below. Physiatrist and rehab team continue to assess barriers to discharge/monitor patient progress toward functional and medical goals.  Function:  Bathing Bathing position      Bathing parts Body parts bathed by patient: Right arm, Left arm, Chest, Abdomen, Front perineal area, Buttocks, Right upper leg, Left upper leg Body parts bathed by helper: Right lower leg, Left lower leg, Back  Bathing assist        Upper Body Dressing/Undressing Upper body dressing   What is the patient wearing?: Pull over shirt/dress     Pull over shirt/dress - Perfomed by patient: Thread/unthread right sleeve, Thread/unthread left sleeve, Put head through opening, Pull shirt over trunk          Upper body assist Assist Level: Supervision or verbal cues(per OT note)      Lower Body Dressing/Undressing Lower body dressing   What is the patient wearing?: Socks, Shoes Underwear - Performed by patient: Thread/unthread left underwear leg, Thread/unthread right underwear leg, Pull underwear up/down   Pants- Performed by patient: Thread/unthread right pants leg, Thread/unthread left pants leg, Pull pants up/down     Non-skid slipper socks- Performed by helper: Don/doff right sock, Don/doff left sock Socks - Performed by patient:  Don/doff right sock, Don/doff left sock   Shoes - Performed by patient: Don/doff right shoe, Don/doff left shoe(slip on)            Lower body assist Assist for lower body dressing: Touching or steadying assistance (Pt > 75%)      Toileting Toileting Toileting activity did not occur: N/A Toileting steps completed by patient: Adjust clothing prior to toileting, Performs perineal hygiene, Adjust clothing after toileting   Toileting Assistive Devices: Grab bar  or rail  Toileting assist Assist level: Touching or steadying assistance (Pt.75%)   Transfers Chair/bed transfer   Chair/bed transfer method: Stand pivot Chair/bed transfer assist level: Supervision or verbal cues Chair/bed transfer assistive device: Environmental consultant, Designer, fashion/clothing     Max distance: 200' Assist level: Supervision or verbal cues   Wheelchair     Max wheelchair distance: 157ft  Assist Level: Supervision or verbal cues  Cognition Comprehension Comprehension assist level: Follows basic conversation/direction with no assist  Expression Expression assist level: Expresses basic needs/ideas: With no assist  Social Interaction Social Interaction assist level: Interacts appropriately 75 - 89% of the time - Needs redirection for appropriate language or to initiate interaction.  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 DVT Prophylaxis/Anticoagulation: SCDs. Monitor for any signs of DVT 3. Pain Management/migraine headaches/chronic back pain:Lidoderm patch,Fioricet for headaches,Ultram as needed   Will consider Topamax if necessary 4. Mood:Prozac 40 mg twice a day 5. Neuropsych: This patientiscapable of making decisions on herown behalf. 6. Skin/Wound Care:Routine skin checks 7. Fluids/Electrolytes/Nutrition:Routine I&O's 8.Hypertension. Toprol-XL 25 mg daily, HCTZ 25 mg daily, Lasix 20 mg twice a day, Cardizem 120 mg every 6 hours.    Controlled on 2/11 9.Hyperlipidemia. 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 Dietary follow-up 11. Acute blood loss anemia: Resolved   Hemoglobin 12.1 on 2/11 12. Hyponatremia: Resolved    Sodium 136 on 2/11 13. Hypokalemia: Critical value  K+ 2.7 on 2/11   Labs ordered for tomorrow  Supplementation initiated x 3 days. 14. Leukocytosis  WBCs 11.8 on 2/11   Afebrile  Cont to monitor  LOS (Days) 3 A FACE TO FACE EVALUATION WAS PERFORMED  Zavian Slowey Karis Juba 12/26/2017 11:00 AM

## 2017-12-26 NOTE — IPOC Note (Signed)
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   Hyponatremia   Acute blood loss anemia   Essential hypertension   Vascular headache   Leukocytosis   Hypokalemia     Functional Problem List: Nursing Medication Management, Edema, Endurance, Pain, Safety, Nutrition  PT Balance, Edema, Endurance, Motor, Nutrition, Pain, Safety  OT Balance, Endurance, Motor, Pain, Safety, Other (Comment)(vestibular)  SLP    TR         Basic ADL's: OT Grooming, Bathing, Dressing, Toileting     Advanced  ADL's: OT Simple Meal Preparation     Transfers: PT Bed Mobility, Bed to Chair, Car, Floor, Occupational psychologist, Research scientist (life sciences): PT Ambulation, Psychologist, prison and probation services, Stairs     Additional Impairments: OT    SLP        TR      Anticipated Outcomes Item Anticipated Outcome  Self Feeding no goal  Swallowing      Basic self-care  MOD I  Toileting  MOD I   Bathroom Transfers MOD I  Bowel/Bladder  Supervision  Transfers  Mod I with LRAD   Locomotion  Ambulatory at mod I level and LRAD   Communication     Cognition     Pain  <3 on a 0-10 pain scale  Safety/Judgment  Min assist with rolling walker   Therapy Plan: PT Intensity: Minimum of 1-2 x/day ,45 to 90 minutes PT Frequency: 5 out of 7 days PT Duration Estimated Length of Stay: 5-7 days  OT Intensity: Minimum of 1-2 x/day, 45 to 90 minutes OT Frequency: 5 out of 7 days OT Duration/Estimated Length of Stay: 5-7      Team Interventions: Nursing Interventions Patient/Family Education, Pain Management, Psychosocial Support, Medication Management, Disease Management/Prevention, Discharge Planning  PT interventions Ambulation/gait training, Balance/vestibular training, Cognitive  remediation/compensation, Community reintegration, Discharge planning, Disease management/prevention, Functional electrical stimulation, Functional mobility training, DME/adaptive equipment instruction, Pain management, Neuromuscular re-education, Patient/family education, Psychosocial support, Skin care/wound management, Splinting/orthotics, Therapeutic Activities, Therapeutic Exercise, Stair training, UE/LE Strength taining/ROM, Wheelchair propulsion/positioning, UE/LE Coordination activities, Visual/perceptual remediation/compensation  OT Interventions Balance/vestibular training, Discharge planning, Pain management, Self Care/advanced ADL retraining, Therapeutic Activities, UE/LE Coordination activities, Disease mangement/prevention, Functional mobility training, Patient/family education, Skin care/wound managment, Therapeutic Exercise, Visual/perceptual remediation/compensation, Firefighter, Fish farm manager, Neuromuscular re-education, Psychosocial support, UE/LE Strength taining/ROM, Wheelchair propulsion/positioning  SLP Interventions    TR Interventions    SW/CM Interventions Discharge Planning, Psychosocial Support, Patient/Family Education   Barriers to Discharge MD  Medical stability  Nursing Weight, Medication compliance, Inaccessible home environment    PT Inaccessible home environment, Decreased caregiver support, Home environment access/layout    OT      SLP      SW       Team Discharge Planning: Destination: PT-Home ,OT- Home , SLP-  Projected Follow-up: PT-Outpatient PT, OT-  Outpatient OT, SLP-  Projected Equipment Needs: PT-Rolling walker with 5" wheels, OT- Tub/shower bench, SLP-  Equipment Details: PT- , OT-  Patient/family involved in discharge planning: PT- Patient,  OT-Patient, SLP-   MD ELOS: 3-5 days. Medical Rehab Prognosis:  Good  Assessment: 52 y.o.right handed femalewith history of migraine headaches, obesity, hyperlipidemia,  hypertension and chronic low back painmaintained on Lidoderm patch. Presented 12/19/2017 with persistent dizziness, fall as well as nausea vomiting. CT of the head showed  acute infarcts in the left more than right cerebellum with posterior fossa mass effect and foramen magnum stenosis. CTA of head with no large vessel occlusion or stenosis. Patient did not receive TPA. 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 No new large vessel occlusion.Neurosurgery Dr. Marikay Alar consulted for review of CTA no surgical intervention at this time. Patient was placed on 3% salineand since discontinued.Follow-up cranial CT scan 12/23/2017 showed continued internal evolution of left larger than right bilateral cerebellar infarcts no evidence for hemorrhagic transformation. Noted associated mild obstructive hydrocephalus slightly improved from previous exam.Echocardiogram with ejection fraction of 65% grade 1 diastolic dysfunction. Neurology consulted presently on aspirin for CVA prophylaxis. Tolerating a regular diet. Bouts of hypokalemia with supplement added. Patient with resulting functional deficits with balance, endurance, mobility, and self-care.  Will set goals for Mod I with PT/OT.   See Team Conference Notes for weekly updates to the plan of care

## 2017-12-26 NOTE — Significant Event (Signed)
CRITICAL VALUE ALERT  Critical Value:  K 2.7   Date & Time Notied:  12/26/17 0645  Provider Notified: Deatra Ina, PA  Orders Received/Actions taken: New orders placed by PA

## 2017-12-26 NOTE — Progress Notes (Signed)
Physical Therapy Session Note  Patient Details  Name: Nicole [name] MRN: [id number] Date of Birth: 10/29/1966  Today's Date: 12/26/2017 PT Individual Time: 1300-1400 PT Individual Time Calculation (min): 60 min   Short Term Goals: Week 1:  PT Short Term Goal 1 (Week 1): STG =LTG due to ELOS  Skilled Therapeutic Interventions/Progress Updates:    no c/o pain.  Session focus on pt education for d/c and habituation for R/L head turns during functional mobility and strengthening tasks.    Pt ambulates with rollator throughout session with distant supervision.  PT instructed pt in alternating toe taps and criss cross toe taps to 4" step with min guard fade to supervision for balance.  Progress to static stance with RLE/LLE propped on 4" step for increased balance challenge and visual tracking during ball tap game.  Pt engaged in seated activity matching cards on L side, focus on tracking R>L>R with compensatory/habituation techniques to reduce symptoms of dizziness.  Gait in hallway focus on R/L head turns to find numbered disks.  Seated UE diagonals with weighted ball focus on tracking ball with eyes.  UEB 2x3 minutes forwards and backwards for UE strengthening and overall activity tolerance.  Pt ambulated back to room at end of session and positioned in bed with significant other present.  Call bell in reach and needs met.   Therapy Documentation Precautions:  Precautions Precautions: Fall Restrictions Weight Bearing Restrictions: No   See Function Navigator for Current Functional Status.   Therapy/Group: Individual Therapy  Michel Santee 12/26/2017, 3:17 PM

## 2017-12-26 NOTE — Progress Notes (Signed)
Occupational Therapy Session Note  Patient Details  Name: Nicole [name] MRN: [id number] Date of Birth: September 17, 1966  Today's Date: 12/26/2017 OT Individual Time: 3664-4034 OT Individual Time Calculation (min): 58 min    Short Term Goals: Week 1:  OT Short Term Goal 1 (Week 1): stg=ltg 2/2 elos  Skilled Therapeutic Interventions/Progress Updates:    Upon entering the room, pt supine in bed awaiting therapist with no c/o pain this session. Pt agreeable to OT intervention. Pt ambulating with rollator 150' to ADL apartment with supervision. Pt preforming various transfers similar to home environment on low sofa, rocking recliner chair, and standard bed with overall supervision with use of rollator. OT educating and demonstrating use of rollator for transfer onto TTB. Pt returning demonstration with steady assistance for safety. OT recommended safety treads as well and removal of rugs in bathroom to decrease fall risk. Pt verbalized understanding. Pt ambulating 200' towards [place] tower and then back to room in same manner as above. Pt returning to sit on EOB with breakfast tray in front of her. Bed alarm activated and call bell within reach.   Therapy Documentation Precautions:  Precautions Precautions: Fall Restrictions Weight Bearing Restrictions: No   See Function Navigator for Current Functional Status.   Therapy/Group: Individual Therapy  Alen Bleacher 12/26/2017, 10:22 AM

## 2017-12-26 NOTE — Progress Notes (Signed)
Occupational Therapy Session Note  Patient Details  Name: Nicole [name] MRN: [id number] Date of Birth: September 14, 1966  Today's Date: 12/26/2017 OT Individual Time: 7182-0990 OT Individual Time Calculation (min): 71 min    Short Term Goals: Week 1:  OT Short Term Goal 1 (Week 1): stg=ltg 2/2 elos  Skilled Therapeutic Interventions/Progress Updates:    OT treatment session focused on UB strengthening, fine motor coordination, and dc planning. Discussed home set-up and safety modifications including bathroom set-up. Pt ambulated to day room without assistance using rollator walker. Pt completed 15 mins on Sci Fit arm bike on level 2 with RPMs above 32. Worked on dynamic balance on [employer] with focus on hip, knee, and ankle strategy. Provided pt with home fine motor program and practiced graded peg board activity and card shuffling task using L hand. Dynamic standing, hand-eye coordination, and B UE coordination with standing dynavision activity. Pt ambulated back to room with rollator mod I. OT reviewed safety precautions and pt left seated EOB with needs met and spouse present.    Therapy Documentation Precautions:  Precautions Precautions: Fall Restrictions Weight Bearing Restrictions: No Pain:  none/denies pain  See Function Navigator for Current Functional Status.   Therapy/Group: Individual Therapy  Valma Cava 12/26/2017, 2:39 PM

## 2017-12-26 NOTE — Plan of Care (Signed)
  Progressing Consults RH STROKE PATIENT EDUCATION Description See Patient Education module for education specifics  12/26/2017 1250 - Progressing by Marylene Land, RN RH SKIN INTEGRITY RH STG MAINTAIN SKIN INTEGRITY WITH ASSISTANCE Description STG Maintain Skin Integrity With minimal Assistance.  12/26/2017 1250 - Progressing by Marylene Land, RN Flowsheets Taken 12/26/2017 1250  STG: Maintain skin integrity with assistance 6-Modified independent RH SAFETY RH STG ADHERE TO SAFETY PRECAUTIONS W/ASSISTANCE/DEVICE Description STG Adhere to Safety Precautions With minimal Assistance appropriate assistive Device.  12/26/2017 1250 - Progressing by Marylene Land, RN Flowsheets Taken 12/26/2017 1250  STG:Pt will adhere to safety precautions with assistance/device 5-Supervision/set up RH PAIN MANAGEMENT RH STG PAIN MANAGED AT OR BELOW PT'S PAIN GOAL Description <3 on a 0-10 pain scale  12/26/2017 1250 - Progressing by Marylene Land, RN RH BOWEL ELIMINATION RH STG MANAGE BOWEL W/MEDICATION W/ASSISTANCE Description STG Manage Bowel with Medication with mod. I Assistance.  12/26/2017 1250 - Progressing by Marylene Land, RN Flowsheets Taken 12/26/2017 1250  STG: Pt will manage bowels with medication with assistance 6-Modified independent

## 2017-12-27 ENCOUNTER — Inpatient Hospital Stay (HOSPITAL_COMMUNITY): Admitting: Physical Therapy

## 2017-12-27 ENCOUNTER — Inpatient Hospital Stay (HOSPITAL_COMMUNITY): Admitting: Occupational Therapy

## 2017-12-27 LAB — BASIC METABOLIC PANEL WITH GFR
Anion gap: 15 (ref 5–15)
BUN: 18 mg/dL (ref 6–20)
CO2: 27 mmol/L (ref 22–32)
Calcium: 9.4 mg/dL (ref 8.9–10.3)
Chloride: 93 mmol/L — ABNORMAL LOW (ref 101–111)
Creatinine, Ser: 0.8 mg/dL (ref 0.44–1.00)
GFR calc Af Amer: >60 mL/min (ref >60)
GFR calc non Af Amer: >60 mL/min (ref >60)
Glucose, Bld: 120 mg/dL — ABNORMAL HIGH (ref 65–99)
Potassium: 3 mmol/L — ABNORMAL LOW (ref 3.5–5.1)
Sodium: 135 mmol/L (ref 135–145)

## 2017-12-27 MED ORDER — POTASSIUM CHLORIDE CRYS ER 20 MEQ PO TBCR
30.0000 meq | EXTENDED_RELEASE_TABLET | Freq: Three times a day (TID) | ORAL | Status: DC
  Administered 2017-12-27 – 2017-12-28 (×3): 30 meq via ORAL
  Filled 2017-12-27 (×3): qty 1

## 2017-12-27 NOTE — Progress Notes (Signed)
Physical Therapy Discharge Summary  Patient Details  Name: Nicole [name] MRN: [id number] Date of Birth: 24-Sep-1966  Today's Date: 12/27/2017 PT Individual Time: 0900-1000 PT Individual Time Calculation (min): 60 min    Patient has met 10 of 10 long term goals due to improved activity tolerance, improved balance, improved postural control and ability to compensate for deficits.  Patient to discharge at an ambulatory level Modified Independent.    Recommendation:  Patient will benefit from ongoing skilled PT services in outpatient setting to continue to advance safe functional mobility, address ongoing impairments in balance, and dizziness, and minimize fall risk.  Equipment: rollator  Reasons for discharge: treatment goals met  Patient/family agrees with progress made and goals achieved: Yes   Skilled PT Intervention: No c/o pain but reporting feeling a little "off" this morning.  Vitals WNL.  Session focus on d/c assessment, education, and activity tolerance.  Pt completing all functional mobility at mod I level, except supervision on compliant surfaces and stairs.  Discussed BERG results from evaluation (did not reassess 2/2 short LOS), and made recommendations for safety with mobility at d/c.  Also discussed f/u with therapy to focus on improving balance and reducing dizziness at d/c.  Nustep x5 minutes at level 3 with LEs only for activity tolerance and strengthening.  Pt returned to room at end of session and positioned with call bell in reach and needs met.   PT Discharge Precautions/Restrictions Precautions Precautions: Fall Precaution Comments: watch for dizziness, especially with head turns Restrictions Weight Bearing Restrictions: No Vital Signs Therapy Vitals Temp: 97.8 F (36.6 C) Temp Source: Oral Pulse Rate: 95 Resp: 20 BP: 133/80 Patient Position (if appropriate): Lying Oxygen Therapy SpO2: 95 % O2 Device: Not Delivered(2L of oxygen d/c'd) O2 Flow Rate  (L/min): 2 L/min Pain Pain Assessment Pain Assessment: No/denies pain Pain Score: 0-No pain Vision/Perception  Vision - Assessment Additional Comments: ongoing dizziness when looking L, improved from eval Perception Perception: Within Functional Limits Praxis Praxis: Intact  Cognition Overall Cognitive Status: Within Functional Limits for tasks assessed Arousal/Alertness: Awake/alert Orientation Level: Oriented X4 Attention: Selective Selective Attention: Appears intact Memory: Appears intact Awareness: Appears intact Safety/Judgment: Appears intact Sensation Sensation Light Touch: Appears Intact(LEs ) Proprioception: Appears Intact Coordination Gross Motor Movements are Fluid and Coordinated: Yes Fine Motor Movements are Fluid and Coordinated: Yes Finger Nose Finger Test: [facility] Heel Shin Test: [facility] Motor  Motor Motor: Within Functional Limits  Mobility Bed Mobility Bed Mobility: Supine to Sit;Sit to Supine Supine to Sit: 6: Modified independent (Device/Increase time);HOB elevated Sit to Supine: HOB elevated;6: Modified independent (Device/Increase time) Transfers Transfers: Yes Sit to Stand: 6: Modified independent (Device/Increase time) Stand to Sit: 6: Modified independent (Device/Increase time) Stand Pivot Transfers: 6: Modified independent (Device/Increase time) Locomotion  Ambulation Ambulation: Yes Ambulation/Gait Assistance: 6: Modified independent (Device/Increase time) Ambulation Distance (Feet): 200 Feet Assistive device: 4-wheeled walker Stairs / Additional Locomotion Stairs: Yes Stairs Assistance: 5: Supervision Stairs Assistance Details: Verbal cues for precautions/safety Stairs Assistance Details (indicate cue type and reason): cues for decreasing looking down to avoid dizziness Stair Management Technique: Two rails Number of Stairs: 12 Ramp: 5: Supervision Wheelchair Mobility Wheelchair Mobility: No  Trunk/Postural Assessment  Cervical  Assessment Cervical Assessment: Within Functional Limits Thoracic Assessment Thoracic Assessment: Within Functional Limits Lumbar Assessment Lumbar Assessment: Exceptions to WFL(decreased rotation) Postural Control Postural Control: Within Functional Limits  Balance Balance Balance Assessed: Yes Static Sitting Balance Static Sitting - Level of Assistance: 6: Modified independent (Device/Increase time) Dynamic Sitting Balance Dynamic  Sitting - Level of Assistance: 6: Modified independent (Device/Increase time) Static Standing Balance Static Standing - Level of Assistance: 6: Modified independent (Device/Increase time) Dynamic Standing Balance Dynamic Standing - Level of Assistance: 6: Modified independent (Device/Increase time) Extremity Assessment      RLE Assessment RLE Assessment: Within Functional Limits LLE Assessment LLE Assessment: Within Functional Limits   See Function Navigator for Current Functional Status.   E  12/27/2017, 9:44 AM   

## 2017-12-27 NOTE — Progress Notes (Signed)
Physical Therapy Session Note  Patient Details  Name: Azya Barbero MRN: [id number] Date of Birth: [birth year]  Today's Date: 12/27/2017 PT Individual Time: 1500-1530 PT Individual Time Calculation (min): 30 min   Short Term Goals: Week 1:  PT Short Term Goal 1 (Week 1): STG =LTG due to ELOS  Skilled Therapeutic Interventions/Progress Updates: Pt presented in bed agreeable to therapy. Pt indicated that has some "wooziness" currently but able to participate. PTA instructed pt in [place] B exercises which pt was able to perform with min cues for technique and good potential for carryover. PTA discussed with pt and SO at length performing exercises at counter or hall with support and supervision. Pt also instructed in performing figure eight with discretion and to avoid when having increased bouts of dizziness. Pt also instructed to bring in HEP at initial OP appt. Pt left in standard chair at end of session with SO present and needs met.      Therapy Documentation Precautions:  Precautions Precautions: Fall Precaution Comments: watch for dizziness w/ head turns Restrictions Weight Bearing Restrictions: No General:   Vital Signs: Therapy Vitals Temp: 98.3 F (36.8 C) Temp Source: Oral Pulse Rate: 91 Resp: 18 BP: 134/86 Patient Position (if appropriate): Sitting Oxygen Therapy SpO2: 96 % O2 Device: Not Delivered   See Function Navigator for Current Functional Status.   Therapy/Group: Individual Therapy  Terita Hejl  Doyel Mulkern, PTA  12/27/2017, 3:57 PM

## 2017-12-27 NOTE — Progress Notes (Signed)
[facility] PHYSICAL MEDICINE & REHABILITATION     PROGRESS NOTE  Subjective/Complaints:      No issues overnite, making excellent progress.  Pt would like to go home tomorrow  ROS: Denies dizziness, headaches, CP, SOB, nausea, vomiting, diarrhea.  Objective: Vital Signs: Blood pressure 133/80, pulse 95, temperature 97.8 F (36.6 C), temperature source Oral, resp. rate 20, height 5\' 3"  (1.6 m), weight 131.7 kg (290 lb 5.5 oz), last menstrual period 12/11/2017, SpO2 98 %. No results found. Recent Labs    12/26/17 0503  WBC 11.8*  HGB 12.1  HCT 38.7  PLT 346   Recent Labs    12/26/17 0503 12/27/17 0603  NA 136 135  K 2.7* 3.0*  CL 91* 93*  GLUCOSE 136* 120*  BUN 17 18  CREATININE 0.88 0.80  CALCIUM 9.2 9.4   CBG (last 3)  No results for input(s): GLUCAP in the last 72 hours.  Wt Readings from Last 3 Encounters:  12/27/17 131.7 kg (290 lb 5.5 oz)  12/23/17 (!) 137.4 kg (302 lb 14.6 oz)  11/12/16 133.2 kg (293 lb 9 oz)    Physical Exam:  BP 133/80 (BP Location: Left Arm)   Pulse 95   Temp 97.8 F (36.6 C) (Oral)   Resp 20   Ht 5\' 3"  (1.6 m)   Wt 131.7 kg (290 lb 5.5 oz)   LMP 12/11/2017 (Exact Date)   SpO2 98%   BMI 51.43 kg/m  Constitutional: She appearswell-developedand well-nourished.  HENT: Normocephalicand atraumatic.  Eyes:EOMare normal. No discharge.  Cardiovascular: RRR. No JVD Respiratory:Breath sounds normal. Norespiratory distress.  GI: Bowel sounds are normal. She exhibitsno distension.  Musculoskeletal:She exhibits noedema, no tenderness. Skin. Warm and dry Neurological.Alert and oriented. Mild left upper ataxia.  Motor: Grossly 5/5 throughout (unchanged) Speech is clear.  Normal insight and awareness Sensation intact to light touch Psych: pleasant and appropriate CN 2-12 intact Assessment/Plan: 1. Functional deficits secondary to left PICA infarct which require 3+ hours per day of interdisciplinary therapy in a  comprehensive inpatient rehab setting. Physiatrist is providing close team supervision and 24 hour management of active medical problems listed below. Physiatrist and rehab team continue to assess barriers to discharge/monitor patient progress toward functional and medical goals.  Function:  Bathing Bathing position      Bathing parts Body parts bathed by patient: Right arm, Left arm, Chest, Abdomen, Front perineal area, Buttocks, Right upper leg, Left upper leg Body parts bathed by helper: Right lower leg, Left lower leg, Back  Bathing assist        Upper Body Dressing/Undressing Upper body dressing   What is the patient wearing?: Pull over shirt/dress     Pull over shirt/dress - Perfomed by patient: Thread/unthread right sleeve, Thread/unthread left sleeve, Put head through opening, Pull shirt over trunk          Upper body assist Assist Level: Supervision or verbal cues(per OT note)      Lower Body Dressing/Undressing Lower body dressing   What is the patient wearing?: Socks, Shoes Underwear - Performed by patient: Thread/unthread left underwear leg, Thread/unthread right underwear leg, Pull underwear up/down   Pants- Performed by patient: Thread/unthread right pants leg, Thread/unthread left pants leg, Pull pants up/down     Non-skid slipper socks- Performed by helper: Don/doff right sock, Don/doff left sock Socks - Performed by patient: Don/doff right sock, Don/doff left sock   Shoes - Performed by patient: Don/doff right shoe, Don/doff left shoe(slip on)  Lower body assist Assist for lower body dressing: Touching or steadying assistance (Pt > 75%)      Toileting Toileting Toileting activity did not occur: N/A Toileting steps completed by patient: Adjust clothing prior to toileting, Performs perineal hygiene, Adjust clothing after toileting   Toileting Assistive Devices: Grab bar or rail  Toileting assist Assist level: Touching or steadying  assistance (Pt.75%)   Transfers Chair/bed transfer   Chair/bed transfer method: Stand pivot Chair/bed transfer assist level: Supervision or verbal cues Chair/bed transfer assistive device: Environmental consultant, Designer, fashion/clothing     Max distance: 200' Assist level: Supervision or verbal cues   Wheelchair     Max wheelchair distance: 15ft  Assist Level: Supervision or verbal cues  Cognition Comprehension Comprehension assist level: Follows complex conversation/direction with no assist  Expression Expression assist level: Expresses complex ideas: With no assist  Social Interaction Social Interaction assist level: Interacts appropriately with others - No medications needed.  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 DVT Prophylaxis/Anticoagulation: SCDs. Monitor for any signs of DVT 3. Pain Management/migraine headaches/chronic back pain:Lidoderm patch,Fioricet for headaches,Ultram as needed    4. Mood:Prozac 40 mg twice a day 5. Neuropsych: This patientiscapable of making decisions on herown behalf. 6. Skin/Wound Care:Routine skin checks 7. Fluids/Electrolytes/Nutrition:Routine I&O's 8.Hypertension. Toprol-XL 25 mg daily, HCTZ 25 mg daily, Lasix 20 mg twice a day, Cardizem 120 mg every 6 hours.     Vitals:   12/27/17 0842 12/27/17 0844  BP:    Pulse:    Resp:    Temp:    SpO2: 95% 98%   9.Hyperlipidemia. 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 Dietary follow-up  13. Hypokalemia: Critical value  K+ 2.7 on 2/11- Repeat 3.0 will recheck in am   Labs ordered for tomorrow  Supplementation initiated x 3  days. 14. Leukocytosis  WBCs 11.8 on 2/11   Afebrile  Cont to monitor  LOS (Days) 4 A FACE TO FACE EVALUATION WAS PERFORMED  Erick Colace 12/27/2017 8:53 AM

## 2017-12-27 NOTE — Discharge Summary (Signed)
Discharge summary job [id number]

## 2017-12-27 NOTE — Discharge Summary (Signed)
Nicole [name], Nicole [name] NO.:  [id number]  MEDICAL RECORD NO.:  [id number]  LOCATION:                                 FACILITY:  PHYSICIAN:  Erick Colace, M.D.DATE OF BIRTH:  03-31-1966  DATE OF ADMISSION:  12/23/2017 DATE OF DISCHARGE:  12/28/2017                              DISCHARGE SUMMARY   DISCHARGE DIAGNOSES: 1. 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 2. Sequential compression devices for deep vein thrombosis     prophylaxis. 3. Pain management. 4. Hypertension. 5. Hyperlipidemia. 6. Morbid obesity. 7. Acute blood loss anemia, resolved. 8. Hyponatremia, resolved. 9. Diabetes mellitus 10. Hypokalemia. Resolved  This is a 52 year old, right-handed female with history of migraine headaches, obesity, hypertension, chronic low-back pain, who lives with significant other.  Presented on December 19, 2017, with dizziness, fall, nausea, vomiting.  CT of the head showed acute infarctions in the left more than right cerebellum with posterior fossa, mass effect, and foramen magnum stenosis.  CTA of the head with no large vessel occlusion or stenosis.  She did not receive tPA.  MRI confirmed left PICA distribution early subacute infarcts as well as multiple infarcts in superior cerebellar hemispheres bilaterally with edema, local mass effect.  No gross hemorrhage.  MRA showed anterior communicating 3-mm prominent infundibulum versus aneurysm.  No large vessel occlusion. Reviewed by Neurosurgery, Dr. Marikay Alar.  No surgical intervention needed.  The patient initially maintained on 3% saline, which was since discontinued.  Latest cranial CT scan showed continued interval evolution of cerebellar infarct.  Echocardiogram with ejection fraction 65%, grade 1 diastolic dysfunction.  Neurology consulted, maintained on aspirin therapy.  Bouts of hypokalemia, with supplement added.  The patient was admitted for comprehensive  rehab program.  PAST MEDICAL HISTORY:  See discharge diagnoses.  SOCIAL HISTORY:  She lives with boyfriend, independent prior to admission, 1-level home, 5 steps to entry.  FUNCTIONAL STATUS UPON ADMISSION TO REHAB SERVICES:  Min to mod assist 3 feet, 2-person handheld assistance, minimal assist sit to stand, mod to max assist activities of daily living.  PHYSICAL EXAMINATION:  VITAL SIGNS:  Blood pressure 158/89, pulse 87, temperature 98, and respirations 20. GENERAL:  Alert female, in no acute distress, appropriate.  She did have some complaints of dizziness. HEENT:  EOMs intact. NECK:  Supple.  Nontender.  No JVD. CARDIAC:  Rate, regular rhythm.  No friction rub. ABDOMEN:  Soft, nontender.  Good bowel sounds. LUNGS:  Clear to auscultation without wheeze.  REHABILITATION HOSPITAL COURSE:  The patient was admitted to Inpatient Rehab Services.  Therapies initiated with physical and occupational therapy as well as rehab nursing.  The following issues were addressed. In regard to left PICA infarction, she would continue on aspirin therapy and arrangements made to follow up with outpatient Neurology Services. Chronic pain management.  She had used a Lidoderm patch in the past for back pain.  Presently using Ultram as well as Fioricet as needed for her headaches with good results.  Blood pressures controlled.  She will continue HCTZ and Toprol as well as Cardizem.  Outpatient followup with her PCP.  Lipitor for hyperlipidemia.  Noted morbid obesity, BMI of  53.27.  Dietary did follow up in regard to discussing with the patient. Hyponatremia had since resolved.  Latest sodium 136 on December 26, 2017. Patient with hemoglobin A1c of 6.3 she had been on Glucophage prior to admission and resumed. Bouts of hypokalemia with supplement added monitored while on HCTZ with follow-up potassium levels 3.6 and potassium discontinued.  The patient received weekly collaborative interdisciplinary  team conferences to discuss estimated length of stay, ongoing family teaching, any barriers to her discharge.  She was ambulating extended distances through the hallways with assistive device.  She could navigate stairs, return back to her room, working with energy conservation techniques, gather her belongings for activities of daily living and homemaking.  Discussed home setting and setup as well as safety modifications, was discussed no driving.  She was modified independent in her room.  DISCHARGE MEDICATIONS:  Included: 1. Aspirin 81 mg p.o. daily. 2. Lipitor 80 mg p.o. daily. 3. Cardizem 120 mg every 6 hours. 4. Prozac 40 mg p.o. b.i.d. 5. HCTZ 25 mg p.o. daily. 6. Toprol-XL 25 mg p.o. daily. 7. Protonix 40 mg p.o. daily. 8. Ultram 100 mg every 6 hours as needed for pain as well as Fioricet     1 tablet every 6 hours as needed headaches. 9. Glucophage 500 mg twice a day   DIET:  Her diet was regular.  FOLLOWUP:  She would follow up outpatient with Dr. Claudette Laws as directed; Dr. Delia Heady, 6 weeks; Dr. Johnney Killian, [facility], [place], [facility] Management.  SPECIAL INSTRUCTIONS:  No driving.     Mariam Dollar, P.A.   ______________________________ Erick Colace, M.D.    DA/MEDQ  D:  12/27/2017  T:  12/27/2017  Job:  [id number]  cc:   Erick Colace, M.D. Pramod P. Pearlean Brownie, MD Dr. Roxy Manns Radiontchenko

## 2017-12-27 NOTE — Progress Notes (Signed)
Inpatient Rehabilitation Center Individual Statement of Services  Patient Name:  Nicole [name]  Date:  12/27/2017  Welcome to the Inpatient Rehabilitation Center.  Our goal is to provide you with an individualized program based on your diagnosis and situation, designed to meet your specific needs.  With this comprehensive rehabilitation program, you will be expected to participate in at least 3 hours of rehabilitation therapies Monday-Friday, with modified therapy programming on the weekends.  Your rehabilitation program will include the following services:  Physical Therapy (PT), Occupational Therapy (OT), 24 hour per day rehabilitation nursing, Case Management (Social Worker), Rehabilitation Medicine, Nutrition Services and Pharmacy Services  Weekly team conferences will be held on Wednesdays to discuss your progress.  Your Social Worker will talk with you frequently to get your input and to update you on team discussions.  Team conferences with you and your family in attendance may also be held.  Expected length of stay:  5 to 7 days  Overall anticipated outcome:  Modified Independent with supervision for community ambulation and stairs  Depending on your progress and recovery, your program may change. Your Social Worker will coordinate services and will keep you informed of any changes. Your Social Worker's name and contact numbers are listed  below.  The following services may also be recommended but are not provided by the Inpatient Rehabilitation Center:   Driving Evaluations  Home Health Rehabiltiation Services  Outpatient Rehabilitation Services   Arrangements will be made to provide these services after discharge if needed.  Arrangements include referral to agencies that provide these services.  Your insurance has been verified to be:   Your primary doctor is:  Dr. Roxy Manns Radiontchenko  Pertinent information will be shared with your doctor and your insurance  company.  Social Worker:  Staci Acosta, LCSW  [phone] or ([phone]  Information discussed with and copy given to patient by: Elvera Lennox, 12/27/2017, 11:51 AM

## 2017-12-27 NOTE — Progress Notes (Signed)
Occupational Therapy Discharge Summary  Patient Details  Name: Nicole [name] MRN: [id number] Date of Birth: February 26, 1966  Today's Date: 12/27/2017  Session 1 OT Individual Time: 9381-0175 OT Individual Time Calculation (min): 28 min   Session 2 OT Individual Time: 1300-1413 OT Individual Time Calculation (min): 73 min     Session 1 OT treatment session focused on dc planning. Pt greeted semi-reclined in bed stating she was feeling dizzy. OT discussed safety awareness with onset of diziness within BADL tasks, energy conservation techniques, and home modifications. Pt demonstrated good safety awareness within conversation and feels she is ready for dc tomorrow.    Session 2 OT treatment session focused on UB there-ex and activity tolerance. Pt given upper body home exercise program and completed there-ex using level 3 green and level 4 blue thera-band. Had pt's spouse assist with exercises and re-iterated importance of body mechanics and form. Functional mobility with rollator through [employer] of hospital. Discussed community safety awareness with rollator and dizziness. Pt returned to room and left seated at EOB with needs met and spouse present.     Patient has met 9 of 9 long term goals due to improved activity tolerance, improved balance and ability to compensate for deficits.  Patient to discharge at overall Modified Independent level.  Patient's care partner is independent to provide the necessary physical assistance at discharge for higher level iADL tasks.  Reasons goals not met: n/a   Recommendation:  No further OT follow up recommended. Pt has received home exercise program for upper body.    Equipment: rolltor walker  Reasons for discharge: treatment goals met and discharge from hospital  Patient/family agrees with progress made and goals achieved: Yes  OT Discharge Precautions/Restrictions  Precautions Precautions: Fall Precaution Comments: watch for dizziness w/  head turns Restrictions Weight Bearing Restrictions: No Pain  none/denies pain ADL ADL Eating: Independent Grooming: Independent Upper Body Bathing: Independent Where Assessed-Upper Body Bathing: Shower Lower Body Bathing: Modified independent Where Assessed-Lower Body Bathing: Shower Upper Body Dressing: Independent Lower Body Dressing: Modified independent Toileting: Independent Toilet Transfer: Modified independent Armed forces technical officer Method: Ambulating Tub/Shower Transfer: Modified independent Tub/Shower Transfer Method: Ambulating ADL Comments: Please see functional navigator Perception  Perception: Within Functional Limits Praxis Praxis: Intact Cognition Overall Cognitive Status: Within Functional Limits for tasks assessed Orientation Level: Oriented X4 Sensation Sensation Light Touch: Appears Intact Coordination Gross Motor Movements are Fluid and Coordinated: Yes Fine Motor Movements are Fluid and Coordinated: Yes Finger Nose Finger Test: [facility] Heel Shin Test: [facility] 9 Hole Peg Test: [employer] Motor  Motor Motor: Within Functional Limits Motor - Discharge Observations: Improved strength and activty tolerance Mobility  Transfers Sit to Stand: 6: Modified independent (Device/Increase time) Stand to Sit: 6: Modified independent (Device/Increase time)  Balance Static Sitting Balance Static Sitting - Level of Assistance: 6: Modified independent (Device/Increase time) Dynamic Sitting Balance Dynamic Sitting - Level of Assistance: 6: Modified independent (Device/Increase time) Static Standing Balance Static Standing - Level of Assistance: 6: Modified independent (Device/Increase time) Dynamic Standing Balance Dynamic Standing - Level of Assistance: 6: Modified independent (Device/Increase time) Extremity/Trunk Assessment RUE Assessment RUE Assessment: Within Functional Limits     See Function Navigator for Current Functional Status.  Daneen Schick Yarelli Decelles 12/27/2017, 7:03  PM

## 2017-12-28 ENCOUNTER — Encounter (HOSPITAL_COMMUNITY): Admitting: Psychology

## 2017-12-28 LAB — BASIC METABOLIC PANEL
Anion gap: 13 (ref 5–15)
BUN: 15 mg/dL (ref 6–20)
CHLORIDE: 99 mmol/L — AB (ref 101–111)
CO2: 25 mmol/L (ref 22–32)
Calcium: 9.2 mg/dL (ref 8.9–10.3)
Creatinine, Ser: 0.7 mg/dL (ref 0.44–1.00)
Glucose, Bld: 168 mg/dL — ABNORMAL HIGH (ref 65–99)
Potassium: 3.6 mmol/L (ref 3.5–5.1)
SODIUM: 137 mmol/L (ref 135–145)

## 2017-12-28 MED ORDER — ATORVASTATIN CALCIUM 80 MG PO TABS: 80.0000 mg | ORAL_TABLET | Freq: Every day | ORAL | 1 refills | Status: AC

## 2017-12-28 MED ORDER — DILTIAZEM HCL 120 MG PO TABS: 120.0000 mg | ORAL_TABLET | Freq: Four times a day (QID) | ORAL | 1 refills | Status: DC

## 2017-12-28 MED ORDER — FLUOXETINE HCL 20 MG PO TABS: 40.0000 mg | ORAL_TABLET | Freq: Two times a day (BID) | ORAL | 3 refills | Status: AC

## 2017-12-28 MED ORDER — MECLIZINE HCL 12.5 MG PO TABS: 12.5000 mg | ORAL_TABLET | Freq: Three times a day (TID) | ORAL | 0 refills | Status: AC | PRN

## 2017-12-28 MED ORDER — TRAMADOL HCL 50 MG PO TABS: 100.0000 mg | ORAL_TABLET | Freq: Four times a day (QID) | ORAL | 0 refills | Status: DC | PRN

## 2017-12-28 MED ORDER — METOPROLOL SUCCINATE ER 25 MG PO TB24: 25.0000 mg | ORAL_TABLET | Freq: Every day | ORAL | 5 refills | Status: DC

## 2017-12-28 MED ORDER — POTASSIUM CHLORIDE ER 10 MEQ PO TBCR: 20.0000 meq | EXTENDED_RELEASE_TABLET | Freq: Every day | ORAL | 0 refills | Status: DC

## 2017-12-28 MED ORDER — METFORMIN HCL 500 MG PO TABS: 500.0000 mg | ORAL_TABLET | Freq: Two times a day (BID) | ORAL | 11 refills | Status: AC

## 2017-12-28 MED ORDER — HYDROCHLOROTHIAZIDE 25 MG PO TABS: 25.0000 mg | ORAL_TABLET | Freq: Every day | ORAL | 1 refills | Status: DC

## 2017-12-28 MED ORDER — BUTALBITAL-APAP-CAFFEINE 50-325-40 MG PO TABS: 1.0000 | ORAL_TABLET | Freq: Four times a day (QID) | ORAL | 0 refills | Status: AC | PRN

## 2017-12-28 MED ORDER — ASPIRIN 81 MG PO TBEC: 81.0000 mg | DELAYED_RELEASE_TABLET | Freq: Every day | ORAL | Status: AC

## 2017-12-28 NOTE — Progress Notes (Signed)
[place] PHYSICAL MEDICINE & REHABILITATION     PROGRESS NOTE  Subjective/Complaints:      No issues overnite, feels ready to go home today  ROS: Denies dizziness, headaches, CP, SOB, nausea, vomiting, diarrhea.  Objective: Vital Signs: Blood pressure 134/86, pulse 88, temperature 98.5 F (36.9 C), temperature source Oral, resp. rate 18, height 5\' 3"  (1.6 m), weight 134.6 kg (296 lb 11.8 oz), last menstrual period 12/11/2017, SpO2 96 %. No results found. Recent Labs    12/26/17 0503  WBC 11.8*  HGB 12.1  HCT 38.7  PLT 346   Recent Labs    12/26/17 0503 12/27/17 0603  NA 136 135  K 2.7* 3.0*  CL 91* 93*  GLUCOSE 136* 120*  BUN 17 18  CREATININE 0.88 0.80  CALCIUM 9.2 9.4   CBG (last 3)  No results for input(s): GLUCAP in the last 72 hours.  Wt Readings from Last 3 Encounters:  12/28/17 134.6 kg (296 lb 11.8 oz)  12/23/17 (!) 137.4 kg (302 lb 14.6 oz)  11/12/16 133.2 kg (293 lb 9 oz)    Physical Exam:  BP 134/86 (BP Location: Left Arm)   Pulse 88   Temp 98.5 F (36.9 C) (Oral)   Resp 18   Ht 5\' 3"  (1.6 m)   Wt 134.6 kg (296 lb 11.8 oz)   LMP 12/11/2017 (Exact Date)   SpO2 96%   BMI 52.57 kg/m  Constitutional: She appearswell-developedand well-nourished.  HENT: Normocephalicand atraumatic.  Eyes:EOMare normal. No discharge.  Cardiovascular: RRR. No JVD Respiratory:Breath sounds normal. Norespiratory distress.  GI: Bowel sounds are normal. She exhibitsno distension.  Musculoskeletal:She exhibits noedema, no tenderness. Skin. Warm and dry Neurological.Alert and oriented. Mild left upper ataxia.  Motor: Grossly 5/5 throughout (unchanged) Speech is clear.  Normal insight and awareness Sensation intact to light touch Psych: pleasant and appropriate CN 2-12 intact Assessment/Plan: 1. Functional deficits secondary to left PICA infarct Stable for D/C today F/u PCP in 3-4 weeks F/u PM&R 2 weeks See D/C summary See D/C  instructions Function:  Bathing Bathing position   Position: Shower  Bathing parts Body parts bathed by patient: Left arm, Right arm, Front perineal area, Abdomen, Chest, Right upper leg, Buttocks, Left upper leg, Left lower leg, Right lower leg Body parts bathed by helper: Right lower leg, Left lower leg, Back  Bathing assist Assist Level: No help, No cues      Upper Body Dressing/Undressing Upper body dressing   What is the patient wearing?: Pull over shirt/dress     Pull over shirt/dress - Perfomed by patient: Thread/unthread right sleeve, Thread/unthread left sleeve, Pull shirt over trunk, Put head through opening          Upper body assist Assist Level: No help, No cues      Lower Body Dressing/Undressing Lower body dressing   What is the patient wearing?: Underwear, Pants, Socks, Shoes Underwear - Performed by patient: Thread/unthread right underwear leg, Pull underwear up/down, Thread/unthread left underwear leg   Pants- Performed by patient: Pull pants up/down, Thread/unthread left pants leg, Thread/unthread right pants leg     Non-skid slipper socks- Performed by helper: Don/doff right sock, Don/doff left sock Socks - Performed by patient: Don/doff right sock, Don/doff left sock   Shoes - Performed by patient: Don/doff left shoe, Don/doff right shoe, Fasten right, Fasten left            Lower body assist Assist for lower body dressing: No Help, No cues  Toileting Toileting Toileting activity did not occur: N/A Toileting steps completed by patient: Adjust clothing prior to toileting, Performs perineal hygiene, Adjust clothing after toileting   Toileting Assistive Devices: Grab bar or rail  Toileting assist Assist level: No help/no cues   Transfers Chair/bed transfer   Chair/bed transfer method: Stand pivot Chair/bed transfer assist level: No Help, no cues, assistive device, takes more than a reasonable amount of time Chair/bed transfer assistive  device: Patent attorney     Max distance: 200 Assist level: No help, No cues, assistive device, takes more than a reasonable amount of time   Wheelchair     Max wheelchair distance: 166ft  Assist Level: Supervision or verbal cues  Cognition Comprehension Comprehension assist level: Follows complex conversation/direction with no assist  Expression Expression assist level: Expresses complex ideas: With no assist  Social Interaction Social Interaction assist level: Interacts appropriately with others - No medications needed.  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 DVT Prophylaxis/Anticoagulation: SCDs. Monitor for any signs of DVT 3. Pain Management/migraine headaches/chronic back pain:Lidoderm patch,Fioricet for headaches,Ultram as needed    4. Mood:Prozac 40 mg twice a day 5. Neuropsych: This patientiscapable of making decisions on herown behalf. 6. Skin/Wound Care:Routine skin checks 7. Fluids/Electrolytes/Nutrition:Routine I&O's 8.Hypertension. Toprol-XL 25 mg daily, HCTZ 25 mg daily, Lasix 20 mg twice a day, Cardizem 120 mg every 6 hours.     Vitals:   12/28/17 0016 12/28/17 0530  BP:  134/86  Pulse: 93 88  Resp: 18 18  Temp:  98.5 F (36.9 C)  SpO2: 95% 96%   9.Hyperlipidemia. 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 Dietary follow-up  13. Hypokalemia:   K+ 2.7 on 2/11- Repeat 3.0 will recheck today     Supplementation initiated x 3 days.  Ongoing supplementation secondary to hydrochlorothiazide 14. Leukocytosis  WBCs 11.8 on 2/11   Afebrile  Cont to  monitor  LOS (Days) 5 A FACE TO FACE EVALUATION WAS PERFORMED  Erick Colace 12/28/2017 9:49 AM

## 2017-12-28 NOTE — Discharge Instructions (Signed)
Inpatient Rehab Discharge Instructions  Nicole [name] Discharge date and time: No discharge date for patient encounter.   Activities/Precautions/ Functional Status: Activity: activity as tolerated Diet: diabetic diet Wound Care: none needed Functional status:  ___ No restrictions     ___ Walk up steps independently ___ 24/7 supervision/assistance   ___ Walk up steps with assistance ___ Intermittent supervision/assistance  ___ Bathe/dress independently ___ Walk with walker     _x__ Bathe/dress with assistance ___ Walk Independently    ___ Shower independently ___ Walk with assistance    ___ Shower with assistance ___ No alcohol     ___ Return to work/school ________  COMMUNITY REFERRALS UPON DISCHARGE:   Outpatient: PT  Agency:  [facility]                            [address], [address]                            [place], [place]  [address]  Phone:  [phone]   Appointment Date/Time:  Monday, February 18th 1:15 PM (arrive at 1 PM) Medical Equipment/Items Ordered:  Rollator  Agency/Supplier:  [facility]                      Phone:  [phone]  GENERAL COMMUNITY RESOURCES FOR PATIENT/FAMILY: Support Groups:  [facility] Stroke Support Group                              Meets the second Thursday of every month from 3-4PM (except June, July, August)                              In the dayroom of [facility]patient Rehabilitation [facility]                              For more information, call [phone]  Special Instructions: No driving STROKE/TIA DISCHARGE INSTRUCTIONS SMOKING Cigarette smoking nearly doubles your risk of having a stroke & is the single most alterable risk factor  If you smoke or have smoked in the last 12 months, you are advised to quit smoking for your health.  Most of the excess cardiovascular risk related to smoking disappears within a year of stopping.  Ask you doctor about anti-smoking  medications  [place] Quit Line: 1-800-QUIT NOW  Free Smoking Cessation Classes [phone]  CHOLESTEROL Know your levels; limit fat & cholesterol in your diet  Lipid Panel     Component Value Date/Time   CHOL 140 12/19/2017 0930   TRIG 107 12/19/2017 0930   HDL 34 (L) 12/19/2017 0930   CHOLHDL 4.1 12/19/2017 0930   VLDL 21 12/19/2017 0930   LDLCALC 85 12/19/2017 0930      Many patients benefit from treatment even if their cholesterol is at goal.  Goal: Total Cholesterol (CHOL) less than 160  Goal:  Triglycerides (TRIG) less than 150  Goal:  HDL greater than 40  Goal:  LDL (LDLCALC) less than 100   BLOOD PRESSURE American Stroke Association blood pressure target is less that 120/80 mm/Hg  Your discharge blood pressure is:  BP: 124/84  Monitor your blood pressure  Limit your salt and  alcohol intake  Many individuals will require more than one medication for high blood pressure  DIABETES (A1c is a blood sugar average for last 3 months) Goal HGBA1c is under 7% (HBGA1c is blood sugar average for last 3 months)  Diabetes:    Lab Results  Component Value Date   HGBA1C 6.3 (H) 12/19/2017     Your HGBA1c can be lowered with medications, healthy diet, and exercise.  Check your blood sugar as directed by your physician  Call your physician if you experience unexplained or low blood sugars.  PHYSICAL ACTIVITY/REHABILITATION Goal is 30 minutes at least 4 days per week  Activity: Increase activity slowly, Therapies: Physical Therapy: Home Health Return to work:   Activity decreases your risk of heart attack and stroke and makes your heart stronger.  It helps control your weight and blood pressure; helps you relax and can improve your mood.  Participate in a regular exercise program.  Talk with your doctor about the best form of exercise for you (dancing, walking, swimming, cycling).  DIET/WEIGHT Goal is to maintain a healthy weight  Your discharge diet is: Diet heart  healthy/carb modified Room service appropriate? Yes; Fluid consistency: Thin  liquids Your height is:  Height: 5\' 3"  (160 cm) Your current weight is: Weight: 129.1 kg (284 lb 9.8 oz) Your Body Mass Index (BMI) is:  BMI (Calculated): 50.43  Following the type of diet specifically designed for you will help prevent another stroke.  Your goal weight range is:    Your goal Body Mass Index (BMI) is 19-24.  Healthy food habits can help reduce 3 risk factors for stroke:  High cholesterol, hypertension, and excess weight.  RESOURCES Stroke/Support Group:  Call [phone]   STROKE EDUCATION PROVIDED/REVIEWED AND GIVEN TO PATIENT Stroke warning signs and symptoms How to activate emergency medical system (call 911). Medications prescribed at discharge. Need for follow-up after discharge. Personal risk factors for stroke. Pneumonia vaccine given:  Flu vaccine given:  My questions have been answered, the writing is legible, and I understand these instructions.  I will adhere to these goals & educational materials that have been provided to me after my discharge from the hospital.      My questions have been answered and I understand these instructions. I will adhere to these goals and the provided educational materials after my discharge from the hospital.  Patient/Caregiver Signature _______________________________ Date __________  Clinician Signature _______________________________________ Date __________  Please bring this form and your medication list with you to all your follow-up doctor's appointments.

## 2017-12-28 NOTE — Progress Notes (Signed)
Placed patient on CPAP via FFM (home mask only) 8.0 cm H20, 21% Fio2. Tolerating well at this time.

## 2017-12-28 NOTE — Progress Notes (Signed)
Patient discussed all the discharged instructions with PA before she left the hospital.

## 2017-12-28 NOTE — Consult Note (Signed)
Neuropsychological Consultation   Patient:   Nicole [name]   DOB:   [birth year]  MR Number:  [id number]  Location:  [facility] [address] [id number] [place] [address] Dept: [id number] Loc: [phone]           Date of Service:   12/28/2017  Start Time:   8 AM End Time:   9 AM  Provider/Observer:  Arley Phenix, Psy.D.       Clinical Neuropsychologist       Billing Code/Service: [phone] 4 Units  Chief Complaint:    Nicole [name] is a 52 year old female with history of migraine headaches, obesity, hyperlipidemia, hypertension and chronic low back painmaintained on Lidoderm patch.  Presented on 12/19/2017 with persistent dizziness, fall and nausea/vomiting.  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  The patient has continued to cope with residual effects during inpatient rehab program.  She has made significant gains but has a great deal of residual worry and anxiety  About why she had stroke and worry about having more.    Reason for Service:  Nicole [name] was referred for neuropsychological consultation due to adjustment issues and coping with residual effects of CVA.  Below is the HPI for the current admission.    Nicole [name] a 52 y.o.right handed femalewith history of migraine headaches, obesity, hyperlipidemia, hypertension and chronic low back painmaintained on Lidoderm patch. Per chart review patient lives with significant other. Independent prior to admission. One level home with 5 steps to entry. Significant other is an Biomedical scientist and has flexibility and work schedule. Presented 12/19/2017 with persistent dizziness, fall as well as nausea vomiting. CT of the head showed acute infarcts in the left more than right cerebellum with posterior fossa mass effect and foramen  magnum stenosis. CTA of head with no large vessel occlusion or stenosis. Patient did not receive TPA. 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 No new large vessel occlusion.Neurosurgery Dr. Marikay Alar consulted for review of CTA no surgical intervention at this time. Patient was placed on 3% salineand since discontinued.Latest follow-up cranial CT scan 12/23/2017 showed continued internal evolution of left larger than right bilateral cerebellar infarcts no evidence for hemorrhagic transformation. Noted associated mild obstructive hydrocephalus slightly improved from previous exam.Echocardiogram with ejection fraction of 65% grade 1 diastolic dysfunction. Neurology consulted presently on aspirin for CVA prophylaxis. Tolerating a regular diet. Bouts of hypokalemia with supplement added. Physical and occupational therapy evaluations completed with recommendations of physical medicine rehabilitation consult.Patient was admitted for a comprehensive rehabilitation program  Current Status:  The patient was in generally good spirits today reporting that she is happy with going home today.  She does report ongoing fear about future risk of stroke.  The patient reports long history of recurrent severe migraines and she worries and her stroke and migraines are related.    Behavioral Observation: Nicole [name]  presents as a 52 y.o.-year-old Right Caucasian Female who appeared her stated age. her dress was Appropriate and she was Well Groomed and her manners were Appropriate to the situation.  her participation was indicative of Appropriate and Attentive behaviors.  There were  not any physical  disabilities noted.  she displayed an appropriate level of cooperation and motivation.     Interactions:    Active Appropriate and Attentive  Attention:   within normal limits and attention span and concentration were age appropriate  Memory:   within normal limits; recent and remote memory intact  Visuo-spatial:  not examined  Speech (Volume):  normal  Speech:   normal; normal  Thought Process:  Coherent and Relevant  Though Content:  WNL; not suicidal and not homicidal  Orientation:   person, place, time/date and situation  Judgment:   Good  Planning:   Good  Affect:    Anxious  Mood:    Anxious  Insight:   Good  Intelligence:   normal  Medical History:   Past Medical History:  Diagnosis Date  . Asthma   . Depression   . Hyperlipemia   . Hypertension   . Migraines   . Obesity   . Sciatica         Abuse/Trauma History: Patient had recent CVA, with fear and worry during event, thinking she was about to die and worried that she was being punished for some bad deed she had done.  Psychiatric History:  Past history of depression, although worry and anxiety are at forefront right now.    Family Med/Psych History: History reviewed. No pertinent family history.  Risk of Suicide/Violence: low Patient denies SI or HI.  Impression/DX:  Nicole [name] is a 52 year old female with history of migraine headaches, obesity, hyperlipidemia, hypertension and chronic low back painmaintained on Lidoderm patch.  Presented on 12/19/2017 with persistent dizziness, fall and nausea/vomiting.  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  The patient has continued to cope with residual effects during inpatient rehab program.  She has made significant gains but has a great deal of residual worry and anxiety  About why she had stroke and worry about having more.    The patient was in  generally good spirits today reporting that she is happy with going home today.  She does report ongoing fear about future risk of stroke.  The patient reports long history of recurrent severe migraines and she worries and her stroke and migraines are related.    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

## 2017-12-28 NOTE — Progress Notes (Signed)
Social Work Discharge Note  The overall goal for the admission was met for:   Discharge location: Yes - home with significant other  Length of Stay: Yes - 5 days   Discharge activity level: Yes - modified independent with supervision for community ambulation and stairs  Home/community participation: Yes  Services provided included: MD, RD, PT, OT, SLP, RN, Pharmacy, Neuropsych and SW  Financial Services: Private Insurance: Tricare  Follow-up services arranged: Outpatient: PT at [facility], DME: rollator from [place] and Patient/Family has no preference for HH/DME agencies  Comments (or additional information):  Pt to d/c with significant other of 6+ years.  He has completed family education.  Pt/S.O. feel prepared to go home.  Pt feels good emotionally and is appreciative of care on CIR.  CSW gave her stroke support group information and financial assistance program through [facility] information.  Patient/Family verbalized understanding of follow-up arrangements: Yes  Individual responsible for coordination of the follow-up plan: pt with support from her significant other  Confirmed correct DME delivered: Trey Sailors 12/28/2017    Hilma Steinhilber, Silvestre Mesi

## 2017-12-29 ENCOUNTER — Telehealth: Payer: Self-pay | Admitting: Registered Nurse

## 2017-12-29 NOTE — Patient Care Conference (Signed)
Inpatient RehabilitationTeam Conference and Plan of Care Update Date: 12/28/2017   Time: 11:00 AM    Patient Name: Nicole [name]      Medical Record Number: [id number]  Date of Birth: 11/29/1965 Sex: Female         Room/Bed: 4W26C/4W26C-01 Payor Info: Payor: TRICARE / Plan: TRICARE / Product Type: *No Product type* /    Admitting Diagnosis: lt pica infarct  Admit Date/Time:  12/23/2017  4:18 PM Admission Comments: No comment available   Primary Diagnosis:  <principal problem not specified> Principal Problem: <principal problem not specified>  Patient Active Problem List   Diagnosis Date Noted  . Leukocytosis   . Hypokalemia   . Vascular headache   . Hyponatremia   . Acute blood loss anemia   . Essential hypertension   . 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 Hydrocephalus 12/20/2017  . New cerebellar infarct ([place]) 12/19/2017  . Encounter for central line placement   . Nausea vomiting and diarrhea     Expected Discharge Date: Expected Discharge Date: 12/28/17  Team Members Present: Physician leading conference: Dr. Alysia Penna Social Worker Present: Alfonse Alpers, LCSW Nurse Present: Blair Heys, RN PT Present: Dwyane Dee, PT OT Present: Cherylynn Ridges, OT SLP Present: Weston Anna, SLP PPS Coordinator present : Daiva Nakayama, RN, CRRN     Current Status/Progress Goal Weekly Team Focus  Medical   Dizziness improving, no visual complaints, no swallowing complaints.  No sensory complaints.  Avoid falls, avoid secondary stroke  Discharge planning   Bowel/Bladder    Continent B/B; LBM 12/26/17 Supervision  No incontinent episodes   Swallow/Nutrition/ Hydration             ADL's   Mod I   Mod I  dizzines, modified bathing/dressing, dc planning   Mobility   mod I   mod I  d/c planning   Communication             Safety/Cognition/ Behavioral Observations             Pain   c/o of headache  Call for assistance when pain medication needed  Assess pain q 4hr. and prn   Skin   CDI  Remain free from breakdown  Assess skin q shift and prn    Rehab Goals Patient on target to meet rehab goals: Yes Rehab Goals Revised: none *See Care Plan and progress notes for long and short-term goals.     Barriers to Discharge  Current Status/Progress Possible Resolutions Date Resolved   Physician    Other (comments)  Balance fall risk  Excellent improvement  Discharge home today      Nursing                  PT                    OT                  SLP                SW                Discharge Planning/Teaching Needs:  Pt to return to her home with her June, significant other, to assist as needed.  June received education from pt's therapists.   Team Discussion:  Pt with no current medical issues.  Her potassium level was low, but is trending up.  Pt has met Mod I goals with therapies.  Pt to go for outpt PT and CSW to order rollator.  Revisions to Treatment Plan:  none    Continued Need for Acute Rehabilitation Level of Care: The patient requires daily medical management by a physician with specialized training in physical medicine and rehabilitation for the following conditions: Daily direction of a multidisciplinary physical rehabilitation program to ensure safe treatment while eliciting the highest outcome that is of practical value to the patient.: Yes Daily medical management of patient stability for increased activity during participation in an intensive rehabilitation regime.: Yes Daily analysis of laboratory values and/or radiology reports with any subsequent need for medication adjustment of medical intervention for : Neurological problems  Brinley Rosete, Silvestre Mesi 12/30/2017, 7:12 AM

## 2017-12-29 NOTE — Telephone Encounter (Signed)
1st Attempt.

## 2017-12-29 NOTE — Progress Notes (Signed)
Social Work Patient ID: Nicole [name], female   DOB: May 18, 1966, 52 y.o.   MRN: [id number]   CSW met with pt and significant other, June, 12-28-17 to discuss team conference discussion and d/c details.  They feel prepared for pt to go home and appreciate care received while on CIR.  Pt to go for outpt PT only and CSW ordered rollator.  CSW remains available to assist as needed.

## 2017-12-30 ENCOUNTER — Telehealth: Payer: Self-pay | Admitting: Registered Nurse

## 2017-12-30 NOTE — Progress Notes (Signed)
Social Work Assessment and Plan  Patient Details  Name: Nicole [name] MRN: [id number] Date of Birth: 05/23/1966  Today's Date: 12/26/2017  Problem List:  Patient Active Problem List   Diagnosis Date Noted  . Leukocytosis   . Hypokalemia   . Vascular headache   . Hyponatremia   . Acute blood loss anemia   . Essential hypertension   . 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 Hydrocephalus 12/20/2017  . New cerebellar infarct ([place]) 12/19/2017  . Encounter for central line placement   . Nausea vomiting and diarrhea    Past Medical History:  Past Medical History:  Diagnosis Date  . Asthma   . Depression   . Hyperlipemia   . Hypertension   . Migraines   . Obesity   . Sciatica    Past Surgical History: History reviewed. No pertinent surgical history. Social History:  reports that  has never smoked. she has never used smokeless tobacco. She reports that she does not drink alcohol or use drugs.  Family / Support Systems Marital Status: Separated Patient Roles: Partner, Parent, Other (Comment)(grandmother) Spouse/Significant Other: Nicole [name] - significant other of 6 years - [phone] Children: Nicole [name] - dtr - [phone] and 4 other dtrs Other Supports: 13 grandchildren who are her motivation to get well and get healthy Anticipated Caregiver: Nicole Ability/Limitations of Caregiver: None Caregiver Availability: 24/7([employer] driver and plans to work schedule around pt's needs) Family Dynamics: close, supportive relationship; pt still married to husband, but they are separated - has his insurance  Social History Preferred language: English Religion:  Read: Yes Write: Yes Employment Status: Unemployed Date Retired/Disabled/Unemployed: last few years Legal History/Current Legal Issues: none reported Guardian/Conservator: N/A - MD has determined that pt is capable of making her own  decisions.   Abuse/Neglect Abuse/Neglect Assessment Can Be Completed: Yes Physical Abuse: Denies Verbal Abuse: Denies Sexual Abuse: Denies Exploitation of patient/patient's resources: Denies Self-Neglect: Denies  Emotional Status Pt's affect, behavior and adjustment status: Pt was a little emotional with CSW at first, but it quickly turned to tears of gratitude that she was doing as well as she was.  She's open to neuropsychologist visit, though, even with spirits high and good motivation level. Recent Psychosocial Issues: This event and now pt worried about finances with S.O. missing some work to be with her.  Psychiatric History: none reported Substance Abuse History: none reported  Patient / Family Perceptions, Expectations & Goals Pt/Family understanding of illness & functional limitations: Pt/S.O. have a good understanding of pt's condition and limitations.  No unanswered medical questions at this time. Premorbid pt/family roles/activities: Pt enjoys spending time with her grandchildren and Nicole.  She also does crafts and runs errands. Anticipated changes in roles/activities/participation: Pt would like to resume the above as soon as she is able. Pt/family expectations/goals: Pt wants to rehab and continue to lose weight (was considering weight loss surgery and going through that process) to be here for grandchildren.  Community Resources Express Scripts: None Premorbid Home Care/DME Agencies: None Transportation available at discharge: significant other Resource referrals recommended: Neuropsychology, Support group (specify)  Discharge Planning Living Arrangements: Spouse/significant other Support Systems: Spouse/significant other, Children, Other relatives Type of Residence: Private residence Insurance Resources: Multimedia programmer (specify)(Tricare) Museum/gallery curator Resources: Family Support Financial Screen Referred: No Living Expenses: Lives with family Money Management:  Significant Other Does the patient have any problems obtaining your medications?: No Home Management: Pt feels she needs to  have things perfect in the apartment daily.  Nicole would like her to slow down and relax sometimes so she doesn't get so stressed.  Pt takes a lot of pride in having home clean and receiving positive feedback about that. Patient/Family Preliminary Plans: Pt plans to return to her apartment with Nicole being there as needed for supervision.  She has mod I goals, so he will work once she is set up for a time. Social Work Anticipated Follow Up Needs: HH/OP, Support Group Expected length of stay: 5-7 days  Clinical Impression CSW met with pt and her significant other, Nicole, to introduce self and role of CSW.  They are very appreciative of being able to come to rehab and that stroke wasn't worse than it was.  She is hopeful to return to a place where she can enjoy her grandchildren and is already improving with short LOS expected.  Pt has mainly mod I goals, but Nicole will be with her for any tasks that require supervision.  He has already been present for therapies.  Pt is motivated to get better and is doing well emotionally.  CSW will continue to follow and will assist pt with any DME and f/u needed, as well as ask neuropsychologist to see pt.    Lorin Hauck, Silvestre Mesi 2/[id number], 11:31 AM

## 2017-12-30 NOTE — Telephone Encounter (Signed)
Transitional Care call Transitional Care Call Completed, Appointment Confirmed, Address Confirmed, New Patient Packet Mailed  Patient name: Nicole [name] DOB: Jul 06, 1966 1. Are you/is patient experiencing any problems since coming home? No a. Are there any questions regarding any aspect of care? No 2. Are there any questions regarding medications administration/dosing? No a. Are meds being taken as prescribed? Yes b. "Patient should review meds with caller to confirm" Medication List Reviewed 3. Have there been any falls? No 4. Has Home Health been to the house and/or have they contacted you? NA: [facility] a. If not, have you tried to contact them? NA b. Can we help you contact them? No 5. Are bowels and bladder emptying properly? Yes a. Are there any unexpected incontinence issues? No b. If applicable, is patient following bowel/bladder programs? NA 6. Any fevers, problems with breathing, unexpected pain? No 7. Are there any skin problems or new areas of breakdown? No 8. Has the patient/family member arranged specialty MD follow up (ie cardiology/neurology/renal/surgical/etc.)?  Yes a. Can we help arrange? NA 9. Does the patient need any other services or support that we can help arrange? No 10. Are caregivers following through as expected in assisting the patient? Yes 11. Has the patient quit smoking, drinking alcohol, or using drugs as recommended? [name] denies smoking, drinking alcohol or using illicit drugs  Appointment date/time 01/06/18, arrivealtime 12:00 for 12:30 appointment with Dr. Wynn Banker at [address] [phone]

## 2018-01-02 ENCOUNTER — Ambulatory Visit: Attending: Physical Medicine & Rehabilitation | Admitting: Rehabilitation

## 2018-01-02 ENCOUNTER — Encounter: Payer: Self-pay | Admitting: Rehabilitation

## 2018-01-02 DIAGNOSIS — R2689 Other abnormalities of gait and mobility: Secondary | ICD-10-CM

## 2018-01-02 DIAGNOSIS — R2681 Unsteadiness on feet: Secondary | ICD-10-CM | POA: Diagnosis not present

## 2018-01-02 DIAGNOSIS — R42 Dizziness and giddiness: Secondary | ICD-10-CM | POA: Diagnosis present

## 2018-01-02 NOTE — Patient Instructions (Signed)
Bending forward:   Sitting in a chair, I want you to practice bending forward, using target if needed to reduce symptoms and then look back to midline (again use target if needed). Do this 5 times if able.  Remember, don't let your dizziness get more than 3 points above where you started.    When turning:   Turn your eyes, then head, then body towards the direction of the turn to reduce symptoms.    Keep using visual targets when walking and in public areas.  Avoid overly busy areas at this time.

## 2018-01-02 NOTE — Therapy (Signed)
[facility] [address] [place], [place], [address] Phone: [phone]   Fax:  [phone]  Physical Therapy Evaluation  Patient Details  Name: Nicole [name] MRN: [id number] Date of Birth: February 22, 1966 Referring Provider: Claudette Laws, MD   Encounter Date: 50/18/2019  PT End of Session - 01/02/18 50    Visit Number  50    Number of Visits  50 eval +8 visits    Date for PT Re-Evaluation  50/19/19    Authorization Type  [employer] NO PTA, approved for eval and 15 visits    Authorization - Number of Visits  50    PT Start Time  50 pt late for evaluation    PT Stop Time  50    PT Time Calculation (min)  50 min    Activity Tolerance  Other (comment) limited by dizziness    Behavior During Therapy  [facility] for tasks assessed/performed       Past Medical History:  Diagnosis Date  . Asthma   . Depression   . Hyperlipemia   . Hypertension   . Migraines   . Obesity   . Sciatica     History reviewed. No pertinent surgical history.  There were no vitals filed for this visit.   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  "I feel dizzy a lot and I feel like I"m slanted to the L and sometimes I just feel wobbly."     Patient is accompained by:  Family member June    Limitations  House hold activities;Walking    Patient Stated Goals  "to get better"     Currently in Pain?  Yes    Pain Score  4     Pain Location  Head    Pain Orientation  -- whole head    Pain Descriptors / Indicators  Aching;Headache    Pain Type  Acute pain    Pain Onset  Yesterday    Pain Frequency  Constant    Aggravating Factors   bright lights     Pain Relieving Factors  medication          [facility] PT Assessment - 01/02/18 50      Assessment   Medical Diagnosis  L PICA CVA     Referring Provider  Claudette Laws, MD    Onset Date/Surgical Date  12/23/17    Hand Dominance  Right    Next MD Visit  11/05/18    Prior Therapy  acute, IP rehab      Precautions   Precautions  Fall      Balance Screen   Has the patient fallen in the past 6 months  No    Has the patient had a decrease in activity level because of a fear of falling?   Yes    Is the patient reluctant to leave their home because of a fear of falling?   Yes      Home Environment   Living Environment  Private residence    Living Arrangements  Spouse/significant other    Available Help at Discharge  Family;Available PRN/intermittently Works after dinner until 2am    Type of Home  Apartment    Home Access  Stairs to enter    Entrance Stairs-Number of Steps  4-5    Entrance Stairs-Rails  Right;Left;Can reach both    Home Layout  One level    Home Equipment  [place] -  4 wheels      Prior Function   Level of Independence  Independent    Leisure  arts and crafts, be with grandchildren (1 y/o-13 y/o)       Cognition   Overall Cognitive Status  Impaired/Different from baseline    Area of Impairment  Attention    Behaviors  -- decreased concentration      Observation/Other Assessments   Observations  Notes that she shakes at times (husband reports this happens at night sometimes)      Sensation   Light Touch  Appears Intact    Hot/Cold  Appears Intact    Proprioception  Appears Intact      Coordination   Gross Motor Movements are Fluid and Coordinated  Yes    Fine Motor Movements are Fluid and Coordinated  Yes      ROM / Strength   AROM / PROM / Strength  Strength      Strength   Overall Strength  Within functional limits for tasks performed    Overall Strength Comments  5/5 in BLEs (seated position)      Transfers   Transfers  Sit to Stand;Stand to Sit    Sit to Stand  7: Independent    Stand to Sit  7: Independent      Ambulation/Gait   Ambulation/Gait  Yes    Ambulation/Gait Assistance  6: Modified independent (Device/Increase time);5: Supervision    Ambulation Distance (Feet)  115 Feet x  2 reps    Assistive device  4-wheeled walker    Gait Pattern  Step-through pattern;Decreased stride length    Ambulation Surface  Level;Indoor    Gait velocity  3.10 ft/sec with walker,  2.45 ft/sec with walker      High Level Balance   High Level Balance Comments  Pt with increased dizziness during most aspects of mobility, therefore did not formally assess balance.  Educated on how deficits from CVA are causing dizziness/imbalance.  Provided pt with forward bending to begin to address motion sensitivity.  See pt instruction.              Objective measurements completed on examination: See above findings.              PT Education - 01/02/18 50    Education provided  Yes    Education Details  Educated on evaluation results, deficits based on location of CVA, importance of risk factor management to avoid further CVAs, discussing any symptoms with MD at visit on Friday.      Person(s) Educated  Patient;Spouse    Methods  Explanation    Comprehension  Verbalized understanding       PT Short Term Goals - 01/02/18 2025      PT SHORT TERM GOAL #1   Title  Pt will initiate HEP in order to indicate decreased fall risk and decreased dizziness.  (Target Date: 50/20/19)    Time  4    Period  Weeks    Status  New    Target Date  50/20/19      PT SHORT TERM GOAL #2   Title  Will assess FGA and improve score by 3 points in order to indicate decreased fall risk.      Time  4    Period  Weeks    Status  New      PT SHORT TERM GOAL #3   Title  Pt will report no more than 4/10 dizziness with functional mobility  in order to indicate improved ability to perform ADLs.     Time  4    Period  Weeks    Status  New      PT SHORT TERM GOAL #4   Title  Pt will ambulate x 150' over indoor surfaces without device in order to indicate improved household mobility.      Time  4    Period  Weeks    Status  New        PT Long Term Goals - 01/02/18 2029      PT LONG TERM GOAL  #1   Title  Pt will be independent with HEP in order to indicate decreased fall risk and decreased dizziness.  (Target Date: 03/03/18)    Time  8    Period  Weeks    Status  New    Target Date  03/03/18      PT LONG TERM GOAL #2   Title  Pt will improve FGA by 6 points from baseline in order to indicate decreased fall risk.      Time  8    Period  Weeks    Status  New      PT LONG TERM GOAL #3   Title  Pt will report no more than 3/10 dizziness with functional mobility in order to indicate improved ability to complete ADLs.     Time  8    Period  Weeks    Status  New      PT LONG TERM GOAL #4   Title  Pt will ambulate 500' over unlevel paved outdoor surfaces (including ramp/curb) without AD in order to indicate improved community mobility.      Time  8    Period  Weeks    Status  New      PT LONG TERM GOAL #5   Title  Pt will ambulate 500' in busy environment with no more than 2 point increase in dizziness to indicate improved functional mobility.      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  Pt with history of HTN, HDL, DM and obesity that could impact progress with therapy.  Pt has been unable to return to community and leisure activity due to increased dizziness and is limited with mobility at home due to dizziness.  Upon PT evaluation, note gait speed WFL, but was somewhat slower without use of rollator.  Note decreased balance when making turns without rollator, however did not get to formally assess balance during session due to time constraint and pt having 6/10 dizziness during session.  Provided education on deficits related to CVA based on location and how we will address these in therapy and with HEP.  Pt with questions regarding symptoms of shaking at night and anxiety.  Educated  that she should discuss this with Dr. Wynn Banker on Friday during follow up appt.  Pt and spouse verbalized understanding.  Pt will benefit from skilled OP neuro PT in order to address deficits.      History and Personal Factors relevant to plan of care:  HTN, DM, HDL, obesity    Clinical Presentation  Evolving    Clinical Presentation due to:  see above    Clinical Decision Making  Moderate    Rehab Potential  Good    PT Frequency  1x / week would recommend 2x/wk however pt has high copay    PT Duration  8 weeks    PT Treatment/Interventions  ADLs/Self Care Home Management;DME Instruction;Gait training;Stair training;Functional mobility training;Therapeutic activities;Therapeutic exercise;Balance training;Neuromuscular re-education;Patient/family education;Vestibular    PT Next Visit Plan  more formal vestibular evaluation, FGA, assess current HEP (esp VOR) and update as needed.  Motion sensitivity    Consulted and Agree with Plan of Care  Patient;Family member/caregiver    Family Member Consulted  Husband June       Patient will benefit from skilled therapeutic intervention in order to improve the following deficits and impairments:  Decreased balance, Decreased cognition, Decreased knowledge of precautions, Decreased mobility, Dizziness, Abnormal gait, Improper body mechanics  Visit Diagnosis: Unsteadiness on feet  Dizziness and giddiness  Other abnormalities of gait and mobility     Problem List Patient Active Problem List   Diagnosis Date Noted  . Leukocytosis   . Hypokalemia   . Vascular headache   . Hyponatremia   . Acute blood loss anemia   . Essential hypertension   . 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 Cytotoxic brain edema (HCC) 12/20/2017  . Hydrocephalus 12/20/2017  . 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 Encounter for central line placement   . Nausea vomiting and diarrhea     Harriet Butte, PT, MPT [facility] [address] [place], [place], [address] Phone: [phone]   Fax:  [phone] 01/02/18, 8:35 PM  Name: Max Romano MRN: [id number] Date of Birth: 04/21/1966

## 2018-01-06 ENCOUNTER — Encounter: Attending: Physical Medicine & Rehabilitation

## 2018-01-06 ENCOUNTER — Ambulatory Visit (HOSPITAL_BASED_OUTPATIENT_CLINIC_OR_DEPARTMENT_OTHER): Admitting: Physical Medicine & Rehabilitation

## 2018-01-06 ENCOUNTER — Encounter: Payer: Self-pay | Admitting: Physical Medicine & Rehabilitation

## 2018-01-06 VITALS — BP 124/83 | HR 104

## 2018-01-06 DIAGNOSIS — G8929 Other chronic pain: Secondary | ICD-10-CM | POA: Insufficient documentation

## 2018-01-06 DIAGNOSIS — I69398 Other sequelae of cerebral infarction: Secondary | ICD-10-CM | POA: Diagnosis not present

## 2018-01-06 DIAGNOSIS — R269 Unspecified abnormalities of gait and mobility: Secondary | ICD-10-CM

## 2018-01-06 DIAGNOSIS — J45909 Unspecified asthma, uncomplicated: Secondary | ICD-10-CM | POA: Insufficient documentation

## 2018-01-06 DIAGNOSIS — M5430 Sciatica, unspecified side: Secondary | ICD-10-CM | POA: Diagnosis not present

## 2018-01-06 DIAGNOSIS — Z79899 Other long term (current) drug therapy: Secondary | ICD-10-CM | POA: Insufficient documentation

## 2018-01-06 DIAGNOSIS — I639 Cerebral infarction, unspecified: Secondary | ICD-10-CM | POA: Diagnosis not present

## 2018-01-06 DIAGNOSIS — I10 Essential (primary) hypertension: Secondary | ICD-10-CM | POA: Diagnosis not present

## 2018-01-06 DIAGNOSIS — F329 Major depressive disorder, single episode, unspecified: Secondary | ICD-10-CM | POA: Diagnosis not present

## 2018-01-06 DIAGNOSIS — Z7982 Long term (current) use of aspirin: Secondary | ICD-10-CM | POA: Insufficient documentation

## 2018-01-06 DIAGNOSIS — Z7984 Long term (current) use of oral hypoglycemic drugs: Secondary | ICD-10-CM | POA: Diagnosis not present

## 2018-01-06 DIAGNOSIS — E785 Hyperlipidemia, unspecified: Secondary | ICD-10-CM | POA: Diagnosis not present

## 2018-01-06 DIAGNOSIS — I69393 Ataxia following cerebral infarction: Secondary | ICD-10-CM | POA: Insufficient documentation

## 2018-01-06 DIAGNOSIS — G43909 Migraine, unspecified, not intractable, without status migrainosus: Secondary | ICD-10-CM | POA: Diagnosis not present

## 2018-01-06 DIAGNOSIS — E669 Obesity, unspecified: Secondary | ICD-10-CM | POA: Diagnosis not present

## 2018-01-06 NOTE — Progress Notes (Signed)
Subjective:  TRANSITIONAL CARE VISIT 14D CALL COMPLETED 2/15  Patient ID: Nicole [name], female    DOB: 07-Jun-1966, 52 y.o.   MRN: [id number] 52 year old, right-handed female with history of migraine headaches, obesity, hypertension, chronic low-back pain, who lives with significant other.  Presented on December 19, 2017, with dizziness, fall, nausea, vomiting.  CT of the head showed acute infarctions in the left more than right cerebellum with posterior fossa, mass effect, and foramen magnum stenosis.  CTA of the head with no large vessel occlusion or stenosis.  She did not receive tPA.  MRI confirmed left PICA distribution early subacute infarcts as well as multiple infarcts in superior cerebellar hemispheres bilaterally with edema, local mass effect.  No gross hemorrhage.  MRA showed anterior communicating 3-mm prominent infundibulum versus aneurysm.  No large vessel occlusion. Reviewed by Neurosurgery, Dr. Marikay Alar.  No surgical intervention needed.  The patient initially maintained on 3% saline, which was since discontinued.  Latest cranial CT scan showed continued interval evolution of cerebellar infarct.  Echocardiogram with ejection fraction 65%, grade 1 diastolic dysfunction.  Neurology consulted, maintained on aspirin therapy.  DATE OF ADMISSION:  12/23/2017 DATE OF DISCHARGE:  12/28/2017  HPI   Mild HA in am, uses tylenol for moderate pain.  Has taken only 1 tramadol and a couple fioricet  Looking down causes nausea and dizziness, symptoms worse in evening Taking meclizine 2-3 times a day which is helpful PT ongoing as outpt  Sweeping and vacuuming at home, Lidoderm used for back pain  amb with walker, one fall when trying to sweep when she was not using walker.No injury with fall  Some increased blurriness of vision since stroke  Appt with PCP 2/27 Pain Inventory Average Pain 7 Pain Right Now 0 My pain is aching  In the last 24 hours, has pain  interfered with the following? General activity 3 Relation with others 3 Enjoyment of life 7 What TIME of day is your pain at its worst? night Sleep (in general) NA  Pain is worse with: walking Pain improves with: rest, heat/ice, medication and TENS Relief from Meds: 5  Mobility walk without assistance use a walker how many minutes can you walk? 10 ability to climb steps?  yes do you drive?  no  Function not employed: date last employed 2017  Neuro/Psych No problems in this area  Prior Studies Any changes since last visit?  no  Physicians involved in your care Any changes since last visit?  no   No family history on file. Social History   Socioeconomic History  . Marital status: Married    Spouse name: Not on file  . Number of children: Not on file  . Years of education: Not on file  . Highest education level: Not on file  Social Needs  . Financial resource strain: Not on file  . Food insecurity - worry: Not on file  . Food insecurity - inability: Not on file  . Transportation needs - medical: Not on file  . Transportation needs - non-medical: Not on file  Occupational History  . Not on file  Tobacco Use  . Smoking status: Never Smoker  . Smokeless tobacco: Never Used  Substance and Sexual Activity  . Alcohol use: No  . Drug use: No  . Sexual activity: Yes    Birth control/protection: Surgical  Other Topics Concern  . Not on file  Social History Narrative  . Not on file   No past surgical history on file.  Past Medical History:  Diagnosis Date  . Asthma   . Depression   . Hyperlipemia   . Hypertension   . Migraines   . Obesity   . Sciatica    LMP 12/11/2017 (Exact Date)   Opioid Risk Score:   Fall Risk Score:  `1  Depression screen PHQ 2/9  No flowsheet data found.    Review of Systems  Constitutional: Positive for diaphoresis and unexpected weight change.  Respiratory: Positive for apnea, shortness of breath and wheezing.     Gastrointestinal: Positive for vomiting.  All other systems reviewed and are negative.      Objective:   Physical Exam  Constitutional: She appears well-developed and well-nourished.  HENT:  Head: Normocephalic and atraumatic.  Cardiovascular: Normal rate, regular rhythm, normal heart sounds and intact distal pulses.  No murmur heard. Pulmonary/Chest: Effort normal and breath sounds normal. No respiratory distress.  Abdominal: Soft. Bowel sounds are normal. She exhibits no distension. There is no tenderness.  Psychiatric: She has a normal mood and affect. Her behavior is normal.  Nursing note and vitals reviewed. Cerebellar no dysmetria on finger-nose-finger or heel-to-shin testing Negative dysdiadochokinesis with rapid alternating supination pronation of bilateral forearms. Standing balance is fair Romberg is negative. Ambulates without assistive device with supervision no evidence of toe drag or knee instability Visual fields are intact There is mild nystagmus with lateral gaze more to the right than to the left side. Lumbar spine no tenderness palpation no pain with lumbar range of motion.  She has normal lumbar flexion extension is limited to 50%         Assessment & Plan:  #1.  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  She has some residual nystagmus as well. Continue outpatient PT  No driving If visual symptoms persist at next visit will recommend ophthalmology follow-up.  Secondary stroke prevention We will follow-up with primary care Referral made to stroke neurology

## 2018-01-13 ENCOUNTER — Ambulatory Visit: Admitting: Rehabilitation

## 2018-01-20 ENCOUNTER — Ambulatory Visit: Admitting: Rehabilitation

## 2018-01-23 ENCOUNTER — Emergency Department (HOSPITAL_COMMUNITY)

## 2018-01-23 ENCOUNTER — Observation Stay (HOSPITAL_COMMUNITY)
Admission: EM | Admit: 2018-01-23 | Discharge: 2018-01-24 | Disposition: A | Discharge status: Home / Self Care | Attending: Internal Medicine | Admitting: Internal Medicine

## 2018-01-23 ENCOUNTER — Encounter (HOSPITAL_COMMUNITY): Payer: Self-pay

## 2018-01-23 ENCOUNTER — Observation Stay (HOSPITAL_COMMUNITY)

## 2018-01-23 ENCOUNTER — Other Ambulatory Visit: Payer: Self-pay

## 2018-01-23 DIAGNOSIS — I10 Essential (primary) hypertension: Secondary | ICD-10-CM | POA: Diagnosis present

## 2018-01-23 DIAGNOSIS — Z7982 Long term (current) use of aspirin: Secondary | ICD-10-CM | POA: Insufficient documentation

## 2018-01-23 DIAGNOSIS — R0902 Hypoxemia: Secondary | ICD-10-CM

## 2018-01-23 DIAGNOSIS — G43909 Migraine, unspecified, not intractable, without status migrainosus: Secondary | ICD-10-CM | POA: Diagnosis present

## 2018-01-23 DIAGNOSIS — E119 Type 2 diabetes mellitus without complications: Secondary | ICD-10-CM

## 2018-01-23 DIAGNOSIS — R0602 Shortness of breath: Secondary | ICD-10-CM | POA: Diagnosis not present

## 2018-01-23 DIAGNOSIS — E876 Hypokalemia: Secondary | ICD-10-CM | POA: Diagnosis not present

## 2018-01-23 DIAGNOSIS — R51 Headache: Secondary | ICD-10-CM

## 2018-01-23 DIAGNOSIS — E1169 Type 2 diabetes mellitus with other specified complication: Secondary | ICD-10-CM | POA: Diagnosis not present

## 2018-01-23 DIAGNOSIS — I501 Left ventricular failure: Secondary | ICD-10-CM | POA: Diagnosis not present

## 2018-01-23 DIAGNOSIS — R519 Headache, unspecified: Secondary | ICD-10-CM

## 2018-01-23 DIAGNOSIS — I639 Cerebral infarction, unspecified: Secondary | ICD-10-CM

## 2018-01-23 DIAGNOSIS — J9601 Acute respiratory failure with hypoxia: Secondary | ICD-10-CM | POA: Diagnosis not present

## 2018-01-23 DIAGNOSIS — J45909 Unspecified asthma, uncomplicated: Secondary | ICD-10-CM | POA: Diagnosis not present

## 2018-01-23 DIAGNOSIS — E785 Hyperlipidemia, unspecified: Secondary | ICD-10-CM | POA: Diagnosis present

## 2018-01-23 DIAGNOSIS — E669 Obesity, unspecified: Secondary | ICD-10-CM | POA: Diagnosis present

## 2018-01-23 DIAGNOSIS — D509 Iron deficiency anemia, unspecified: Secondary | ICD-10-CM | POA: Diagnosis not present

## 2018-01-23 DIAGNOSIS — Z9989 Dependence on other enabling machines and devices: Secondary | ICD-10-CM

## 2018-01-23 DIAGNOSIS — G4733 Obstructive sleep apnea (adult) (pediatric): Secondary | ICD-10-CM | POA: Diagnosis not present

## 2018-01-23 DIAGNOSIS — I519 Heart disease, unspecified: Secondary | ICD-10-CM | POA: Diagnosis present

## 2018-01-23 DIAGNOSIS — Z8673 Personal history of transient ischemic attack (TIA), and cerebral infarction without residual deficits: Secondary | ICD-10-CM | POA: Diagnosis not present

## 2018-01-23 LAB — I-STAT CHEM 8, ED
BUN: 24 mg/dL — ABNORMAL HIGH (ref 6–20)
CREATININE: 1 mg/dL (ref 0.44–1.00)
Calcium, Ion: 1.02 mmol/L — ABNORMAL LOW (ref 1.15–1.40)
Chloride: 93 mmol/L — ABNORMAL LOW (ref 101–111)
Glucose, Bld: 141 mg/dL — ABNORMAL HIGH (ref 65–99)
HEMATOCRIT: 35 % — AB (ref 36.0–46.0)
Hemoglobin: 11.9 g/dL — ABNORMAL LOW (ref 12.0–15.0)
POTASSIUM: 2.8 mmol/L — AB (ref 3.5–5.1)
SODIUM: 136 mmol/L (ref 135–145)
TCO2: 29 mmol/L (ref 22–32)

## 2018-01-23 LAB — CBC
HCT: 33.9 % — ABNORMAL LOW (ref 36.0–46.0)
Hemoglobin: 10.6 g/dL — ABNORMAL LOW (ref 12.0–15.0)
MCH: 24.1 pg — ABNORMAL LOW (ref 26.0–34.0)
MCHC: 31.3 g/dL (ref 30.0–36.0)
MCV: 77.2 fL — AB (ref 78.0–100.0)
PLATELETS: 244 10*3/uL (ref 150–400)
RBC: 4.39 MIL/uL (ref 3.87–5.11)
RDW: 15.2 % (ref 11.5–15.5)
WBC: 13.3 10*3/uL — AB (ref 4.0–10.5)

## 2018-01-23 LAB — PROTIME-INR
INR: 1.1
PROTHROMBIN TIME: 14.1 s (ref 11.4–15.2)

## 2018-01-23 LAB — DIFFERENTIAL
BASOS ABS: 0 10*3/uL (ref 0.0–0.1)
BASOS PCT: 0 %
EOS ABS: 0 10*3/uL (ref 0.0–0.7)
Eosinophils Relative: 0 %
Lymphocytes Relative: 18 %
Lymphs Abs: 2.4 10*3/uL (ref 0.7–4.0)
MONO ABS: 0.7 10*3/uL (ref 0.1–1.0)
Monocytes Relative: 5 %
NEUTROS ABS: 10.2 10*3/uL — AB (ref 1.7–7.7)
Neutrophils Relative %: 77 %

## 2018-01-23 LAB — I-STAT ARTERIAL BLOOD GAS, ED
Acid-Base Excess: 3 mmol/L — ABNORMAL HIGH (ref 0.0–2.0)
BICARBONATE: 27.2 mmol/L (ref 20.0–28.0)
O2 Saturation: 95 %
PCO2 ART: 40.7 mmHg (ref 32.0–48.0)
PH ART: 7.433 (ref 7.350–7.450)
PO2 ART: 76 mmHg — AB (ref 83.0–108.0)
Patient temperature: 98.6
TCO2: 28 mmol/L (ref 22–32)

## 2018-01-23 LAB — COMPREHENSIVE METABOLIC PANEL
ALT: 17 U/L (ref 14–54)
AST: 21 U/L (ref 15–41)
Albumin: 3.8 g/dL (ref 3.5–5.0)
Alkaline Phosphatase: 83 U/L (ref 38–126)
Anion gap: 14 (ref 5–15)
BUN: 22 mg/dL — ABNORMAL HIGH (ref 6–20)
CHLORIDE: 94 mmol/L — AB (ref 101–111)
CO2: 25 mmol/L (ref 22–32)
CREATININE: 1.02 mg/dL — AB (ref 0.44–1.00)
Calcium: 9.2 mg/dL (ref 8.9–10.3)
GFR calc non Af Amer: >60 mL/min (ref >60)
Glucose, Bld: 140 mg/dL — ABNORMAL HIGH (ref 65–99)
POTASSIUM: 2.7 mmol/L — AB (ref 3.5–5.1)
SODIUM: 133 mmol/L — AB (ref 135–145)
Total Bilirubin: 0.9 mg/dL (ref 0.3–1.2)
Total Protein: 8.5 g/dL — ABNORMAL HIGH (ref 6.5–8.1)

## 2018-01-23 LAB — I-STAT BETA HCG BLOOD, ED (MC, WL, AP ONLY)

## 2018-01-23 LAB — I-STAT TROPONIN, ED: TROPONIN I, POC: 0 ng/mL (ref 0.00–0.08)

## 2018-01-23 LAB — CBG MONITORING, ED: GLUCOSE-CAPILLARY: 124 mg/dL — AB (ref 65–99)

## 2018-01-23 LAB — APTT: APTT: 27 s (ref 24–36)

## 2018-01-23 MED ORDER — ALBUTEROL SULFATE (2.5 MG/3ML) 0.083% IN NEBU: 3.0000 mL | INHALATION_SOLUTION | RESPIRATORY_TRACT | Status: DC | PRN

## 2018-01-23 MED ORDER — FUROSEMIDE 20 MG PO TABS: 20.0000 mg | ORAL_TABLET | Freq: Two times a day (BID) | ORAL | Status: DC

## 2018-01-23 MED ORDER — BUTALBITAL-APAP-CAFFEINE 50-325-40 MG PO TABS
1.0000 | ORAL_TABLET | Freq: Four times a day (QID) | ORAL | Status: DC | PRN
  Administered 2018-01-24 (×2): 1 via ORAL
  Filled 2018-01-23 (×2): qty 1

## 2018-01-23 MED ORDER — PROCHLORPERAZINE EDISYLATE 5 MG/ML IJ SOLN
10.0000 mg | Freq: Once | INTRAMUSCULAR | Status: AC
  Administered 2018-01-23: 10 mg via INTRAVENOUS
  Filled 2018-01-23: qty 2

## 2018-01-23 MED ORDER — POTASSIUM CHLORIDE CRYS ER 20 MEQ PO TBCR
40.0000 meq | EXTENDED_RELEASE_TABLET | Freq: Once | ORAL | Status: AC
  Administered 2018-01-23: 40 meq via ORAL
  Filled 2018-01-23: qty 2

## 2018-01-23 MED ORDER — MECLIZINE HCL 12.5 MG PO TABS: 12.5000 mg | ORAL_TABLET | Freq: Three times a day (TID) | ORAL | Status: DC | PRN

## 2018-01-23 MED ORDER — IOPAMIDOL (ISOVUE-370) INJECTION 76%
INTRAVENOUS | Status: AC
  Administered 2018-01-23: 100 mL
  Filled 2018-01-23: qty 100

## 2018-01-23 MED ORDER — ONDANSETRON HCL 4 MG PO TABS: 4.0000 mg | ORAL_TABLET | Freq: Four times a day (QID) | ORAL | Status: DC | PRN

## 2018-01-23 MED ORDER — LORAZEPAM 2 MG/ML IJ SOLN: 0.5000 mg | Freq: Once | INTRAMUSCULAR | Status: DC | PRN

## 2018-01-23 MED ORDER — KETOROLAC TROMETHAMINE 30 MG/ML IJ SOLN
30.0000 mg | Freq: Once | INTRAMUSCULAR | Status: AC
Start: 2018-01-23 — End: 2018-01-23
  Administered 2018-01-23: 30 mg via INTRAVENOUS
  Filled 2018-01-23: qty 1

## 2018-01-23 MED ORDER — FLUOXETINE HCL 20 MG PO CAPS
40.0000 mg | ORAL_CAPSULE | Freq: Two times a day (BID) | ORAL | Status: DC
  Administered 2018-01-23 – 2018-01-24 (×2): 40 mg via ORAL
  Filled 2018-01-23 (×2): qty 2

## 2018-01-23 MED ORDER — ATORVASTATIN CALCIUM 80 MG PO TABS
80.0000 mg | ORAL_TABLET | Freq: Every day | ORAL | Status: DC
  Administered 2018-01-23 – 2018-01-24 (×2): 80 mg via ORAL
  Filled 2018-01-23 (×2): qty 1

## 2018-01-23 MED ORDER — POTASSIUM CHLORIDE CRYS ER 10 MEQ PO TBCR
20.0000 meq | EXTENDED_RELEASE_TABLET | Freq: Every day | ORAL | Status: DC
  Administered 2018-01-24: 20 meq via ORAL
  Filled 2018-01-23 (×2): qty 2

## 2018-01-23 MED ORDER — HYDRALAZINE HCL 20 MG/ML IJ SOLN: 10.0000 mg | Freq: Four times a day (QID) | INTRAMUSCULAR | Status: DC | PRN

## 2018-01-23 MED ORDER — METOPROLOL SUCCINATE ER 25 MG PO TB24
25.0000 mg | ORAL_TABLET | Freq: Every day | ORAL | Status: DC
  Administered 2018-01-23: 25 mg via ORAL
  Filled 2018-01-23: qty 1

## 2018-01-23 MED ORDER — POTASSIUM CHLORIDE IN NACL 20-0.9 MEQ/L-% IV SOLN
Freq: Once | INTRAVENOUS | Status: AC
  Administered 2018-01-23: 1000 mL via INTRAVENOUS
  Filled 2018-01-23: qty 1000

## 2018-01-23 MED ORDER — DILTIAZEM HCL ER COATED BEADS 240 MG PO CP24
480.0000 mg | ORAL_CAPSULE | Freq: Every day | ORAL | Status: DC
  Administered 2018-01-23: 480 mg via ORAL
  Filled 2018-01-23: qty 2

## 2018-01-23 MED ORDER — SODIUM CHLORIDE 0.9% FLUSH
3.0000 mL | Freq: Two times a day (BID) | INTRAVENOUS | Status: DC
  Administered 2018-01-23 – 2018-01-24 (×2): 3 mL via INTRAVENOUS

## 2018-01-23 MED ORDER — ONDANSETRON HCL 4 MG/2ML IJ SOLN: 4.0000 mg | Freq: Four times a day (QID) | INTRAMUSCULAR | Status: DC | PRN

## 2018-01-23 MED ORDER — POTASSIUM CHLORIDE 10 MEQ/100ML IV SOLN
10.0000 meq | Freq: Once | INTRAVENOUS | Status: AC
  Administered 2018-01-23: 10 meq via INTRAVENOUS
  Filled 2018-01-23: qty 100

## 2018-01-23 MED ORDER — TRAMADOL HCL 50 MG PO TABS: 100.0000 mg | ORAL_TABLET | Freq: Four times a day (QID) | ORAL | Status: DC | PRN

## 2018-01-23 MED ORDER — DIPHENHYDRAMINE HCL 50 MG/ML IJ SOLN
25.0000 mg | Freq: Once | INTRAMUSCULAR | Status: AC
  Administered 2018-01-23: 25 mg via INTRAVENOUS
  Filled 2018-01-23: qty 1

## 2018-01-23 MED ORDER — ASPIRIN EC 81 MG PO TBEC
81.0000 mg | DELAYED_RELEASE_TABLET | Freq: Every day | ORAL | Status: DC
  Administered 2018-01-23 – 2018-01-24 (×2): 81 mg via ORAL
  Filled 2018-01-23 (×2): qty 1

## 2018-01-23 NOTE — ED Notes (Signed)
Donnamarie Poag Hospitalist paged to notify her re pt not being able to get the MRI done

## 2018-01-23 NOTE — ED Notes (Signed)
Pt ambulated to the Br with steady gait. 

## 2018-01-23 NOTE — ED Notes (Signed)
Pt care assumed.  Pt is resting and appears comfortable.  She reports her h/a is gone.  Family is at bedside.  Neuro was just at bedside.  Pt updated with plan of care.  She verbalizes understanding

## 2018-01-23 NOTE — ED Notes (Signed)
Pt made aware that there is a chance that she could be having a stroke and that she is hypoxic at RA.  Eventually, pt decided to stay.

## 2018-01-23 NOTE — ED Triage Notes (Signed)
Pt reports vomiting and headache X3 days. Pt reprots rib cage pain. Pt alert and oriented. States she just had a stroke 3 days ago. Tachycardic in triage. Pt states some numbness in her legs X2 days.

## 2018-01-23 NOTE — Consult Note (Addendum)
Requesting Physician: Dr. Donnald Garre    Chief Complaint: SOB, Possible strtroke  History obtained from:  Patient    HPI:                                                                                                                                         Nicole [name] is an 52 y.o. female who was recently hospitalized back in February 8 for multiple cerebellar infarcts.  At that time her A1c was 6.3, LDL was 8.5, echo showed 60-65% ejection fraction, wall motion was normal. 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  No additional large vessel occlusions or aneurysms.  At that time MRI of head showed multiple additional very small infarcts in the superior cerebellar hemispheres bilaterally with edema and local mass-effect resulting in effacement of fourth ventricle, downward descent of cerebellar tonsils, and partial effacement of the quadrigeminal plate cistern.    Today, CTA of the head showed extensive infarct involving the posterior to mid left cerebellar hemisphere, stable infarct in the middle to superior right cerebellar hemisphere with progression of infarct more superiorly and medially in the right cerebellum compared to recent prior studies.  No other more recent appearing infarct compared to prior studies and no mass or hemorrhage.  She states that about 3 days ago she noticed that she was short of breath; she also had numbness and tingling in bilateral legs and started falling to the right. In addition, she was having nausea and vomiting along with a headache. Currently her lower extremity symptoms have subsided. Of note, patient does admit to missing her medications today and yesterday secondary to nausea and vomiting.  Date last known well: Date:  01/21/2018 Time last known well: Time: 22:00 tPA Given: No: Recent stroke NIH stroke scale of 0, modified Rankin of 1.   Past Medical History:  Diagnosis Date  . Asthma   . Depression   . Hyperlipemia   . Hypertension   . Migraines   . Obesity   . Sciatica     History reviewed. No pertinent surgical history.  Family History  Problem Relation Age of Onset  . Hypertension Mother   . Hypertension Father        Social History:  reports that  has never smoked. she has never used smokeless tobacco. She reports that she does not drink alcohol or use drugs.  Allergies:  Allergies  Allergen Reactions  . Asa [Aspirin] Other (See Comments)    Contraindication per provider - pt states not allergic, PCP stated did not go with treatment regimen, ok for pt to take medication  . Hydrocodone-Acetaminophen Other (See Comments)    headache  . Ibuprofen Nausea Only  . Morphine And  Related Palpitations    Medications:                                                                                                                           No current facility-administered medications for this encounter.    Current Outpatient Medications  Medication Sig Dispense Refill  . acetaminophen (TYLENOL) 325 MG tablet Take 650 mg by mouth every 6 (six) hours as needed for mild pain.    Marland Kitchen albuterol (VENTOLIN HFA) 108 (90 Base) MCG/ACT inhaler Inhale 2 puffs into the lungs every 4 (four) hours as needed for wheezing.    Marland Kitchen aspirin EC 81 MG EC tablet Take 1 tablet (81 mg total) by mouth daily.    Marland Kitchen atorvastatin (LIPITOR) 80 MG tablet Take 1 tablet (80 mg total) by mouth daily. 30 tablet 1  . butalbital-acetaminophen-caffeine (FIORICET, ESGIC) 50-325-40 MG tablet Take 1 tablet by mouth every 6 (six) hours as needed for headache or migraine. 20 tablet 0  . CARTIA XT 240 MG 24 hr capsule Take 480 mg by mouth daily.  0  . FLUoxetine (PROZAC) 20 MG tablet Take 2 tablets (40 mg total) by mouth 2 (two) times  daily. 60 tablet 3  . furosemide (LASIX) 20 MG tablet Take 20 mg by mouth 2 (two) times daily.    . hydrochlorothiazide (HYDRODIURIL) 25 MG tablet Take 1 tablet (25 mg total) by mouth daily. 30 tablet 1  . lidocaine (LIDODERM) 5 % Place 1 patch onto the skin daily. Remove & Discard patch within 12 hours or as directed by MD 30 patch 0  . meclizine (ANTIVERT) 12.5 MG tablet Take 1 tablet (12.5 mg total) by mouth 3 (three) times daily as needed for dizziness. 60 tablet 0  . metFORMIN (GLUCOPHAGE) 500 MG tablet Take 1 tablet (500 mg total) by mouth 2 (two) times daily with a meal. 60 tablet 11  . metoprolol succinate (TOPROL-XL) 25 MG 24 hr tablet Take 1 tablet (25 mg total) by mouth daily. 30 tablet 5  . omeprazole (PRILOSEC) 20 MG capsule Take 20 mg by mouth at bedtime.    . ondansetron (ZOFRAN) 4 MG tablet Take 4 mg by mouth every 8 (eight) hours as needed for nausea or vomiting.    . potassium chloride (K-DUR) 10 MEQ tablet Take 20 mEq by mouth daily.  2  . traMADol (ULTRAM) 50 MG tablet Take 2 tablets (100 mg total) by mouth every 6 (six) hours as needed for moderate pain. 30 tablet 0  . diltiazem (CARDIZEM) 120 MG tablet Take 1 tablet (120 mg total) by mouth every 6 (six) hours. (Patient not taking: Reported on 01/23/2018) 180 tablet 1     ROS:  History obtained from the patient  General ROS: negative for - chills, fatigue, fever, night sweats, weight gain or weight loss Psychological ROS: negative for - , hallucinations, memory difficulties, mood swings or  Ophthalmic ROS: negative for - blurry vision, double vision, eye pain or loss of vision ENT ROS: negative for - epistaxis, nasal discharge, oral lesions, sore throat, tinnitus or vertigo Respiratory ROS: Positive for -   shortness of breath Cardiovascular ROS: negative for - chest pain, dyspnea on exertion,   Gastrointestinal ROS: negative for - abdominal pain, diarrhea,  nausea/vomiting or stool incontinence Genito-Urinary ROS: negative for - dysuria, hematuria, incontinence or urinary frequency/urgency Musculoskeletal ROS: negative for - joint swelling or muscular weakness Neurological ROS: as noted in HPI   General Examination:                                                                                                      Blood pressure 131/81, pulse (!) 108, temperature 98.5 F (36.9 C), temperature source Oral, resp. rate (!) 23, last menstrual period 01/16/2018, SpO2 98 %.  HEENT-  Normocephalic, no lesions, without obvious abnormality.  Normal external eye and conjunctiva.   Cardiovascular- S1-S2 audible, pulses palpable throughout   Lungs-no rhonchi or wheezing noted, no excessive working breathing.  Saturations within normal limits Abdomen- All 4 quadrants palpated and nontender Extremities- Warm, dry and intact Musculoskeletal-no joint tenderness, deformity or swelling Skin-warm and dry, no hyperpigmentation, vitiligo, or suspicious lesions  Neurological Examination Mental Status: Alert, oriented, thought content appropriate.  Speech fluent without evidence of aphasia.  Able to follow 3 step commands without difficulty. Cranial Nerves: II: Visual fields grossly normal,  III,IV, VI: ptosis not present, extra-ocular motions intact bilaterally, pupils equal, round, reactive to light and accommodation V,VII: smile symmetric, facial light touch sensation normal bilaterally VIII: hearing normal bilaterally IX,X: uvula rises symmetrically XI: bilateral shoulder shrug XII: midline tongue extension Motor: Right : Upper extremity   5/5    Left:     Upper extremity   5/5  Lower extremity   5/5     Lower extremity   5/5 Tone and bulk:normal tone throughout; no atrophy noted Sensory: Pinprick and light touch intact throughout, bilaterally Deep Tendon Reflexes: 2+ and symmetric upper  extremities with 1+ patellar reflexes and trace achilles bilaterally Plantars: Right: downgoing   Left: downgoing Cerebellar: Subtle dyssinergia and dysmetria with right FNF. Otherwise normal.  Gait: Gait was not tested due to patient malaise   Lab Results: Basic Metabolic Panel: Recent Labs  Lab 01/23/18 1122 01/23/18 1148  NA 133* 136  K 2.7* 2.8*  CL 94* 93*  CO2 25  --   GLUCOSE 140* 141*  BUN 22* 24*  CREATININE 1.02* 1.00  CALCIUM 9.2  --     CBC: Recent Labs  Lab 01/23/18 1122 01/23/18 1148  WBC 13.3*  --   NEUTROABS 10.2*  --   HGB 10.6* 11.9*  HCT 33.9* 35.0*  MCV 77.2*  --   PLT 244  --     Lipid Panel: No results for input(s): CHOL, TRIG,  HDL, CHOLHDL, VLDL, LDLCALC in the last 168 hours.  CBG: Recent Labs  Lab 01/23/18 1137  GLUCAP 124*    Imaging: Ct Head Wo Contrast  Result Date: 01/23/2018 CLINICAL DATA:  Acute onset severe headache EXAM: CT HEAD WITHOUT CONTRAST TECHNIQUE: Contiguous axial images were obtained from the base of the skull through the vertex without intravenous contrast. COMPARISON:  Brain MRI December 19, 2017 and head CT December 23, 2017 FINDINGS: Brain: The ventricles are normal in size and configuration. Fourth ventricle is in the midline. There is extensive decreased attenuation throughout the inferior to mid left cerebellar hemisphere with extension to the level of the fourth ventricle on the right inferiorly, stable. There is a small infarct involving a portion of the dentate nucleus of the right cerebellum. This infarct in the right cerebellum now also involves a focus of territory immediately superior and medial to the dentate nucleus on the right, indicative of a degree of progression since the previous study. The degree of decreased attenuation in the left cerebellum is stable. There is no well-defined mass or hemorrhage. There is no extra-axial fluid collection or midline shift. The supratentorial gray-white compartment regions  appear stable without focal infarct noted in the supratentorial regions. Vascular: There is no appreciable hyperdense vessel. There is calcification in the distal left vertebral artery. Skull: Bony calvarium appears intact. Sinuses/Orbits: There is opacification of multiple ethmoid air cells bilaterally. There is mucosal thickening in the sphenoid sinuses bilaterally as well as in the inferior frontal sinuses. There are retention cysts in both maxillary antra, larger on the left than on the right. Orbits appear symmetric bilaterally. Other: Mastoid air cells are clear. IMPRESSION: Extensive infarct involving the posterior to mid left cerebellar hemisphere, stable. Infarct in the mid to superior right cerebellar hemisphere with progression of infarct more superiorly and medially in the right cerebellum compared to recent prior studies. No other more recent appearing infarct compared to prior studies. No mass or hemorrhage. Calcification noted in the left distal vertebral artery. Multifocal paranasal sinus disease present. Electronically Signed   By: Bretta Bang III M.D.   On: 01/23/2018 15:11   Ct Angio Chest Pe W And/or Wo Contrast  Result Date: 01/23/2018 CLINICAL DATA:  Chest pain and shortness of breath for 1 day EXAM: CT ANGIOGRAPHY CHEST WITH CONTRAST TECHNIQUE: Multidetector CT imaging of the chest was performed using the standard protocol during bolus administration of intravenous contrast. Multiplanar CT image reconstructions and MIPs were obtained to evaluate the vascular anatomy. CONTRAST:  [id number] COMPARISON:  12/19/2017 chest x-ray FINDINGS: Cardiovascular: Thoracic aorta is well visualize with mild atherosclerotic calcifications. No aneurysmal dilatation or dissection is seen. Some noncalcified atherosclerotic plaque is noted at the level of the aortic hiatus without focal stenosis. Heart is at the upper limits of normal in size. The pulmonary artery shows a normal branching pattern  without definitive filling defect to suggest pulmonary embolism. Mediastinum/Nodes: Thoracic inlet is within normal limits. No hilar or mediastinal adenopathy is noted. The esophagus is within normal limits. Lungs/Pleura: Mild dependent changes are noted without focal infiltrate or sizable effusion. No pneumothorax is seen. Upper Abdomen: Visualized upper abdomen is unremarkable. Musculoskeletal: The osseous structures show degenerative change of the thoracic spine. No acute bony abnormality is noted. Review of the MIP images confirms the above findings. IMPRESSION: No evidence of pulmonary emboli. Mild dependent changes in the lungs bilaterally. Electronically Signed   By: Alcide Clever M.D.   On: 01/23/2018 15:13    History and  physical exam documented by Felicie Morn PA-C, [facility] [phone]  01/23/2018, 4:18 PM   Assessment: 52 y.o. female re-presenting to the hospital with a 3 day history of shortness of breath, nausea/vomiting and unstable gait.  She was recently in the hospital for bilateral cerebellar infarcts with stroke evaluation as above.  At this point is unclear if patient has had further cerebellar infarcts resulting in symptoms or this could be nausea/vomiting secondary to another etiology, with strokes seen on CT possibly representing interval evolution of the previously diagnosed strokes. Also with elevated white count suggestive of possible infection, which also could have precipitated her nausea/vomiting.  Stroke Risk Factors - hyperlipidemia and hypertension  Recommendations: MRI brain to evaluate for possible new infarcts.  Admit to hospital team for internal medicine to evaluate for shortness of breath.  If MRI is positive stroke team will see in the morning.  Much of her stroke workup was recently done on February 8 thus likely will not need to be repeated.  Patient may need a TEE if positive posterior circulation infarcts are noted on repeat MRI  I have seen and  examined the patient. Assessment and recommendations discussed with Neurology team.  Electronically signed: Dr. Caryl Pina  Please page stroke NP  Or  PA  Or MD from 8am -4 pm  as this patient from this time will be  followed by the stroke.   You can look them up on [email or web address]  Password [email or web address]

## 2018-01-23 NOTE — ED Notes (Signed)
Pt made aware that a medication is ordered for her anxiety so she can get the MRI done.  Pt is now refusing to get it done, stating "I am not going to do it again."  Maren Reamer hospitalist made aware

## 2018-01-23 NOTE — ED Notes (Addendum)
Family at bedside.  Pt states she wants to go home d/t no insurance and won't be able to pay for her admission and the MRI.  Will notify admitting MD. Pt made aware.  [facility] hospitalist paged

## 2018-01-23 NOTE — H&P (Addendum)
History and Physical    Nicole [name] BJY:[id number] DOB: [birth year] DOA: 01/23/2018  **Will place in observation status based on the expectation that the patient will need hospitalization/ hospital care less than or equal to 24 hours  PCP: Verl Bangs, MD   Attending physician: Ophelia Charter  Patient coming from/Resides with: Private residence/boyfriend  Chief Complaint: Severe migraine headache, hypoxia  HPI: Nicole [name] is a 52 y.o. female with medical history significant for obesity, sleep apnea, hypertension, dyslipidemia, and newly diagnosed diabetes.  Patient was hospitalized last month in the context of significant acute stroke.  She was found to have bilateral acute infarctions involving the cerebellum primarily on the left with MRI confirming left PICA distribution with edema and local mass-effect.  She was critically ill and placed initially in the ICU.  Primary deficits were related to gait disturbance and ataxia as well as dizziness.  She subsequently went to CIR and was discharged from rehab on 2/12.  She has since followed up with the rehab physician in the outpatient setting on 2/22 and has been progressing well complaining primarily of mild a.m. headache relieved with Tylenol, she is able to perform basic ADLs at home including sweeping and vacuuming.  She has fallen once while she was trying to perform household chores while not utilizing a walker.  She presented today after having an unrelenting headache for 3 days associated with vomiting.  So was having some bilateral leg numbness.  Because this was similar to her presentation for her initial stroke she opted to come to the ER.  Since arrival to the ER she has been given a migraine cocktail with resolution in her migraine symptoms.  CT of the head showed no acute stroke or other abnormalities.  She was noted to be hypoxemic on room air O2 sats down to 76%.  ABG revealed PO2 of 76 reportedly on 2 L oxygen.  CTA  of the chest was negative for any acute abnormalities including PE.  Patient was also found to be hypokalemic with potassium of 2.8 noting she is on a thiazide diuretic at home for treatment of hypertension.  Neurology was contacted given CVA history and an MRI was ordered to rule out possible new infarct but has yet to be completed.  Neurology has seen the patient in consultation and agrees with admission to evaluate regarding shortness of breath.  If MRI positive for stroke stroke team will see the patient in the a.m.  ED Course:  Vital Signs: BP 131/81   Pulse (!) 108   Temp 98.5 F (36.9 C) (Oral)   Resp (!) 23   LMP 01/16/2018 (Within Days)   SpO2 98%  CT head without contrast: As above CT angiography chest PE protocol: As above  Lab data: Sodium 133, potassium 2.7, chloride 94, CO2 25, glucose 140, BUN 22, creatinine 1.02, anion gap 14, LFTs not elevated, white count 13,300 with neutrophils 77% but absolute neutrophils 10.2%, hemoglobin 10.6, MCV 77.2, platelets 244,000, coags are normal. Medications and treatments: Potassium 10 mEq IV bolus x1, normal saline IV fluids with 20 mEq / 1000 cc x1, Compazine 10 mg IV x1, Benadryl 25 mg IV x1, Toradol 30 mg IV x1, potassium tablet 40 mEq x1  Review of Systems:  In addition to the HPI above,  No Fever-chills, myalgias or other constitutional symptoms No changes with Vision or hearing, new weakness, tingling, numbness in any extremity, dizziness, dysarthria or word finding difficulty, tremors or seizure activity No problems swallowing food or Liquids,  indigestion/reflux, choking or coughing while eating, abdominal pain with or after eating No Chest pain, Cough or Shortness of Breath, palpitations, orthopnea or DOE No Abdominal pain, N/V, melena,hematochezia, dark tarry stools, constipation No dysuria, malodorous urine, hematuria or flank pain No new skin rashes, lesions, masses or bruises, No new joint pains, aches, swelling or redness No  recent unintentional weight gain or loss No polyuria, polydypsia or polyphagia   Past Medical History:  Diagnosis Date  . Asthma   . Depression   . Hyperlipemia   . Hypertension   . Migraines   . Obesity   . Sciatica     History reviewed. No pertinent surgical history.  Social History   Socioeconomic History  . Marital status: Married    Spouse name: Not on file  . Number of children: Not on file  . Years of education: Not on file  . Highest education level: Not on file  Social Needs  . Financial resource strain: Not on file  . Food insecurity - worry: Not on file  . Food insecurity - inability: Not on file  . Transportation needs - medical: Not on file  . Transportation needs - non-medical: Not on file  Occupational History  . Occupation: stay at home   Tobacco Use  . Smoking status: Never Smoker  . Smokeless tobacco: Never Used  Substance and Sexual Activity  . Alcohol use: No  . Drug use: No  . Sexual activity: Yes    Birth control/protection: Surgical  Other Topics Concern  . Not on file  Social History Narrative  . Not on file    Mobility: Rolling walker Work history: Currently out of work in the context of recent CVA and rehabilitative recovery process   Allergies  Allergen Reactions  . Asa [Aspirin] Other (See Comments)    Contraindication per provider - pt states not allergic, PCP stated did not go with treatment regimen, ok for pt to take medication  . Hydrocodone-Acetaminophen Other (See Comments)    headache  . Ibuprofen Nausea Only  . Morphine And Related Palpitations    Family History  Problem Relation Age of Onset  . Hypertension Mother   . Hypertension Father      Prior to Admission medications   Medication Sig Start Date End Date Taking? Authorizing Provider  acetaminophen (TYLENOL) 325 MG tablet Take 650 mg by mouth every 6 (six) hours as needed for mild pain.   Yes [provider]  albuterol (VENTOLIN HFA) 108 (90 Base)  MCG/ACT inhaler Inhale 2 puffs into the lungs every 4 (four) hours as needed for wheezing. 03/21/17  Yes [provider]  aspirin EC 81 MG EC tablet Take 1 tablet (81 mg total) by mouth daily. 12/28/17  Yes Angiulli, Mcarthur Rossetti, PA-C  atorvastatin (LIPITOR) 80 MG tablet Take 1 tablet (80 mg total) by mouth daily. 12/28/17  Yes Angiulli, Mcarthur Rossetti, PA-C  butalbital-acetaminophen-caffeine (FIORICET, ESGIC) 50-325-40 MG tablet Take 1 tablet by mouth every 6 (six) hours as needed for headache or migraine. 12/28/17  Yes Angiulli, Mcarthur Rossetti, PA-C  CARTIA XT 240 MG 24 hr capsule Take 480 mg by mouth daily. 01/09/18  Yes [provider]  FLUoxetine (PROZAC) 20 MG tablet Take 2 tablets (40 mg total) by mouth 2 (two) times daily. 12/28/17  Yes Angiulli, Mcarthur Rossetti, PA-C  furosemide (LASIX) 20 MG tablet Take 20 mg by mouth 2 (two) times daily. 03/12/17  Yes [provider]  hydrochlorothiazide (HYDRODIURIL) 25 MG tablet Take 1  tablet (25 mg total) by mouth daily. 12/28/17  Yes Angiulli, Mcarthur Rossetti, PA-C  lidocaine (LIDODERM) 5 % Place 1 patch onto the skin daily. Remove & Discard patch within 12 hours or as directed by MD 10/25/17  Yes Joy, Shawn C, PA-C  meclizine (ANTIVERT) 12.5 MG tablet Take 1 tablet (12.5 mg total) by mouth 3 (three) times daily as needed for dizziness. 12/28/17  Yes Angiulli, Mcarthur Rossetti, PA-C  metFORMIN (GLUCOPHAGE) 500 MG tablet Take 1 tablet (500 mg total) by mouth 2 (two) times daily with a meal. 12/28/17 12/28/18 Yes Angiulli, Mcarthur Rossetti, PA-C  metoprolol succinate (TOPROL-XL) 25 MG 24 hr tablet Take 1 tablet (25 mg total) by mouth daily. 12/28/17  Yes Angiulli, Mcarthur Rossetti, PA-C  omeprazole (PRILOSEC) 20 MG capsule Take 20 mg by mouth at bedtime.   Yes [provider]  ondansetron (ZOFRAN) 4 MG tablet Take 4 mg by mouth every 8 (eight) hours as needed for nausea or vomiting.   Yes [provider]  potassium chloride (K-DUR) 10 MEQ tablet Take 20 mEq by mouth daily.  01/16/18  Yes [provider]  traMADol (ULTRAM) 50 MG tablet Take 2 tablets (100 mg total) by mouth every 6 (six) hours as needed for moderate pain. 12/28/17  Yes Angiulli, Mcarthur Rossetti, PA-C  diltiazem (CARDIZEM) 120 MG tablet Take 1 tablet (120 mg total) by mouth every 6 (six) hours. Patient not taking: Reported on 01/23/2018 12/28/17   Charlton Amor, PA-C    Physical Exam: Vitals:   01/23/18 1058 01/23/18 1230 01/23/18 1545  BP: 125/87 113/86 131/81  Pulse: (!) 121 (!) 113 (!) 108  Resp: (!) 24 (!) 23 (!) 23  Temp: 98.5 F (36.9 C)    TempSrc: Oral    SpO2: 100% 100% 98%      Constitutional: NAD, calm, comfortable-sleeping but awakened easily for history and exam Eyes: PERRL, lids and conjunctivae normal ENMT: Mucous membranes are moist. Posterior pharynx clear of any exudate or lesions.Normal dentition.  Neck: normal, supple, no masses, no thyromegaly Respiratory: clear to auscultation bilaterally, no wheezing, no crackles. Normal respiratory effort. No accessory muscle use.  Cardiovascular: Regular rate and rhythm, no murmurs / rubs / gallops. No extremity edema. 2+ pedal pulses. No carotid bruits.  Abdomen: no tenderness, no masses palpated. No hepatosplenomegaly. Bowel sounds positive.  Musculoskeletal: no clubbing / cyanosis. No joint deformity upper and lower extremities. Good ROM, no contractures. Normal muscle tone.  Skin: no rashes, lesions, ulcers. No induration Neurologic: CN 2-12 grossly intact. Sensation intact, DTR normal. Strength 5/5 x all 4 extremities.  Gait and ambulation were not tested noting patient has persistent neurological deficits regarding mild gait disturbance post stroke requiring a walker. Psychiatric: Normal judgment and insight. Alert and oriented x 3. Normal mood.    Labs on Admission: I have personally reviewed following labs and imaging studies  CBC: Recent Labs  Lab 01/23/18 1122 01/23/18 1148  WBC 13.3*  --   NEUTROABS 10.2*  --    HGB 10.6* 11.9*  HCT 33.9* 35.0*  MCV 77.2*  --   PLT 244  --    Basic Metabolic Panel: Recent Labs  Lab 01/23/18 1122 01/23/18 1148  NA 133* 136  K 2.7* 2.8*  CL 94* 93*  CO2 25  --   GLUCOSE 140* 141*  BUN 22* 24*  CREATININE 1.02* 1.00  CALCIUM 9.2  --    GFR: CrCl cannot be calculated (Unknown ideal weight.). Liver Function Tests: Recent Labs  Lab 01/23/18 1122  AST 21  ALT 17  ALKPHOS 83  BILITOT 0.9  PROT 8.5*  ALBUMIN 3.8   No results for input(s): LIPASE, AMYLASE in the last 168 hours. No results for input(s): AMMONIA in the last 168 hours. Coagulation Profile: Recent Labs  Lab 01/23/18 1122  INR 1.10   Cardiac Enzymes: No results for input(s): CKTOTAL, CKMB, CKMBINDEX, TROPONINI in the last 168 hours. BNP (last 3 results) No results for input(s): PROBNP in the last 8760 hours. HbA1C: No results for input(s): HGBA1C in the last 72 hours. CBG: Recent Labs  Lab 01/23/18 1137  GLUCAP 124*   Lipid Profile: No results for input(s): CHOL, HDL, LDLCALC, TRIG, CHOLHDL, LDLDIRECT in the last 72 hours. Thyroid Function Tests: No results for input(s): TSH, T4TOTAL, FREET4, T3FREE, THYROIDAB in the last 72 hours. Anemia Panel: No results for input(s): VITAMINB12, FOLATE, FERRITIN, TIBC, IRON, RETICCTPCT in the last 72 hours. Urine analysis:    Component Value Date/Time   COLORURINE AMBER (A) 12/18/2017 1455   APPEARANCEUR CLOUDY (A) 12/18/2017 1455   LABSPEC 1.028 12/18/2017 1455   PHURINE 6.0 12/18/2017 1455   GLUCOSEU NEGATIVE 12/18/2017 1455   HGBUR SMALL (A) 12/18/2017 1455   BILIRUBINUR NEGATIVE 12/18/2017 1455   KETONESUR NEGATIVE 12/18/2017 1455   PROTEINUR 100 (A) 12/18/2017 1455   UROBILINOGEN 1.0 06/26/2015 1708   NITRITE NEGATIVE 12/18/2017 1455   LEUKOCYTESUR NEGATIVE 12/18/2017 1455   Sepsis Labs: @LABRCNTIP (procalcitonin:4,lacticidven:4) )No results found for this or any previous visit (from the past 240 hour(s)).    Radiological Exams on Admission: Ct Head Wo Contrast  Result Date: 01/23/2018 CLINICAL DATA:  Acute onset severe headache EXAM: CT HEAD WITHOUT CONTRAST TECHNIQUE: Contiguous axial images were obtained from the base of the skull through the vertex without intravenous contrast. COMPARISON:  Brain MRI December 19, 2017 and head CT December 23, 2017 FINDINGS: Brain: The ventricles are normal in size and configuration. Fourth ventricle is in the midline. There is extensive decreased attenuation throughout the inferior to mid left cerebellar hemisphere with extension to the level of the fourth ventricle on the right inferiorly, stable. There is a small infarct involving a portion of the dentate nucleus of the right cerebellum. This infarct in the right cerebellum now also involves a focus of territory immediately superior and medial to the dentate nucleus on the right, indicative of a degree of progression since the previous study. The degree of decreased attenuation in the left cerebellum is stable. There is no well-defined mass or hemorrhage. There is no extra-axial fluid collection or midline shift. The supratentorial gray-white compartment regions appear stable without focal infarct noted in the supratentorial regions. Vascular: There is no appreciable hyperdense vessel. There is calcification in the distal left vertebral artery. Skull: Bony calvarium appears intact. Sinuses/Orbits: There is opacification of multiple ethmoid air cells bilaterally. There is mucosal thickening in the sphenoid sinuses bilaterally as well as in the inferior frontal sinuses. There are retention cysts in both maxillary antra, larger on the left than on the right. Orbits appear symmetric bilaterally. Other: Mastoid air cells are clear. IMPRESSION: Extensive infarct involving the posterior to mid left cerebellar hemisphere, stable. Infarct in the mid to superior right cerebellar hemisphere with progression of infarct more superiorly and  medially in the right cerebellum compared to recent prior studies. No other more recent appearing infarct compared to prior studies. No mass or hemorrhage. Calcification noted in the left distal vertebral artery. Multifocal paranasal sinus disease present. Electronically Signed  By: Bretta Bang III M.D.   On: 01/23/2018 15:11   Ct Angio Chest Pe W And/or Wo Contrast  Result Date: 01/23/2018 CLINICAL DATA:  Chest pain and shortness of breath for 1 day EXAM: CT ANGIOGRAPHY CHEST WITH CONTRAST TECHNIQUE: Multidetector CT imaging of the chest was performed using the standard protocol during bolus administration of intravenous contrast. Multiplanar CT image reconstructions and MIPs were obtained to evaluate the vascular anatomy. CONTRAST:  [id number] COMPARISON:  12/19/2017 chest x-ray FINDINGS: Cardiovascular: Thoracic aorta is well visualize with mild atherosclerotic calcifications. No aneurysmal dilatation or dissection is seen. Some noncalcified atherosclerotic plaque is noted at the level of the aortic hiatus without focal stenosis. Heart is at the upper limits of normal in size. The pulmonary artery shows a normal branching pattern without definitive filling defect to suggest pulmonary embolism. Mediastinum/Nodes: Thoracic inlet is within normal limits. No hilar or mediastinal adenopathy is noted. The esophagus is within normal limits. Lungs/Pleura: Mild dependent changes are noted without focal infiltrate or sizable effusion. No pneumothorax is seen. Upper Abdomen: Visualized upper abdomen is unremarkable. Musculoskeletal: The osseous structures show degenerative change of the thoracic spine. No acute bony abnormality is noted. Review of the MIP images confirms the above findings. IMPRESSION: No evidence of pulmonary emboli. Mild dependent changes in the lungs bilaterally. Electronically Signed   By: Alcide Clever M.D.   On: 01/23/2018 15:13    EKG: (Independently reviewed) sinus tachycardia  with ventricular rate 120 bpm, QTC 579 ms, normal R wave rotation, no acute ischemic changes, no change from previous  Assessment/Plan Principal Problem:   Acute respiratory failure with hypoxia  -Patient presents with migraine headache and was found decreased pulse oximetry saturations in the 70s prior to administration of migraine cocktail with PaO2 on ABG 76 on 2 L oxygen -CTA chest negative for any acute etiology including PE, pneumonia or heart failure -Suspect hypoventilation and patient with migraine with underlying sleep apnea and possible obesity hypoventilation syndrome -Continue oxygen and wean as tolerated -Patient may benefit from ambulatory oximetry on room air after discharge to determine if she is eligible for home oxygen -If not already utilizing, likely would benefit from oxygen bleeding from nocturnal CPAP  Active Problems:   Migraines -Patient has history of migraines prior to her stroke last month -Resented with headache not typical of her previous migraines, location to the left side of the head-suspect this may be related to evolution and migraine pathology in the poststroke setting -Continue preadmission Fioricet -She reports presenting headache now resolved    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 -Consulting neurologist documented that stroke workup recently done last month and does not need to be repeated but likely will require a TEE if repeat MRI demonstrates posterior circulation infarcts -Continue baby aspirin and high-dose atorvastatin    OSA on CPAP/  Obesity -Continue nocturnal support -As noted above may require oxygen bleeding in     Acute hypokalemia -In the context of ongoing utilization of thiazide diuretic -Was prescribed Kdur 20 mEq daily-she has been compliant and continues thiazide  diuretic this dosage needs to be increased -Labs in a.m.    Hypertension/Left ventricular diastolic dysfunction -No documented episodes of congestive heart failure with diastolic dysfunction noted on stroke evaluation echocardiogram -Continue Cartia XT and metoprolol -Holding thiazide diuretic for now noting patient also appear to be somewhat volume depleted at  presentation and this may be worsening her baseline ataxia and gait disturbance    Diabetes mellitus type 2 in obese  -Newly diagnosed during previous admission noting hemoglobin A1c 6.3 -Hold preadmission metformin      Hyperlipemia -Continue preadmission statin     Microcytic anemia -Hemoglobin around 10 which is lower than most recent labs while in rehab -Obtain TSH and anemia panel    **Additional lab, imaging and/or diagnostic evaluation at discretion of supervising physician  DVT prophylaxis: SCDs Code Status: Full Family Communication: Boyfriend Disposition Plan: Home Consults called: Neurology/[place]    ELLIS,ALLISON L. ANP-BC [facility] Pager [phone]   If 7PM-7AM, please contact night-coverage [email or web address] Password [email or web address]  01/23/2018, 6:11 PM

## 2018-01-23 NOTE — ED Notes (Signed)
Dr. Ophelia Charter returned pag, she states the pt will have to leave AMA.

## 2018-01-23 NOTE — ED Provider Notes (Signed)
[facility] EMERGENCY DEPARTMENT Provider Note   CSN: [id number] Arrival date & time: 01/23/18  1051     History   Chief Complaint Chief Complaint  Patient presents with  . Headache  . Chest Pain    HPI Nicole [name] is a 52 y.o. female.  HPI She reports she has had vomiting for 3 days.  She reports that she is vomiting about 3 times per day.  She denies fever.  She reports that she got a severe headache in her left frontal area.  Patient however reports this pain is very typical for her "migraines".  Notably, the patient had multiple cerebellar infarcts presentation of "severe migraine" [phone] with GE like symptoms.  Patient denies she had visual loss.  She denies that she feels dizzy like she did when she had her stroke symptoms.  Denies she has focal weakness numbness or tingling.  Reports she has felt generally weak.  Reports she saw the ophthalmologist on Friday and had her eyes dilated. Past Medical History:  Diagnosis Date  . Asthma   . Depression   . Hyperlipemia   . Hypertension   . Migraines   . Obesity   . Sciatica     Patient Active Problem List   Diagnosis Date Noted  . 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 Gait disturbance, post-stroke 01/06/2018  . Leukocytosis   . Hypokalemia   . Vascular headache   . Hyponatremia   . Acute blood loss anemia   . Essential hypertension   . 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 Cytotoxic brain edema (HCC) 12/20/2017  . Hydrocephalus 12/20/2017  . 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 Encounter for central line placement   . Nausea vomiting and diarrhea     History reviewed. No pertinent surgical history.  OB History    No data available       Home Medications    Prior to Admission medications   Medication Sig Start Date End Date Taking? Authorizing Provider  acetaminophen (TYLENOL) 325 MG tablet Take 650 mg by mouth every 6 (six) hours  as needed for mild pain.   Yes [provider]  albuterol (VENTOLIN HFA) 108 (90 Base) MCG/ACT inhaler Inhale 2 puffs into the lungs every 4 (four) hours as needed for wheezing. 03/21/17  Yes [provider]  aspirin EC 81 MG EC tablet Take 1 tablet (81 mg total) by mouth daily. 12/28/17  Yes Angiulli, Mcarthur Rossetti, PA-C  atorvastatin (LIPITOR) 80 MG tablet Take 1 tablet (80 mg total) by mouth daily. 12/28/17  Yes Angiulli, Mcarthur Rossetti, PA-C  butalbital-acetaminophen-caffeine (FIORICET, ESGIC) 50-325-40 MG tablet Take 1 tablet by mouth every 6 (six) hours as needed for headache or migraine. 12/28/17  Yes Angiulli, Mcarthur Rossetti, PA-C  CARTIA XT 240 MG 24 hr capsule Take 480 mg by mouth daily. 01/09/18  Yes [provider]  FLUoxetine (PROZAC) 20 MG tablet Take 2 tablets (40 mg total) by mouth 2 (two) times daily. 12/28/17  Yes Angiulli, Mcarthur Rossetti, PA-C  furosemide (LASIX) 20 MG tablet Take 20 mg by mouth 2 (two) times daily. 03/12/17  Yes [provider]  hydrochlorothiazide (HYDRODIURIL) 25 MG tablet Take 1 tablet (25 mg total) by mouth daily. 12/28/17  Yes Angiulli, Mcarthur Rossetti, PA-C  lidocaine (LIDODERM) 5 % Place 1 patch onto the skin daily. Remove & Discard patch within 12 hours or as directed by MD 10/25/17  Yes Joy, Shawn C, PA-C  meclizine (ANTIVERT) 12.5 MG tablet Take 1 tablet (12.5 mg total) by mouth 3 (three) times daily as needed for dizziness. 12/28/17  Yes Angiulli, Mcarthur Rossetti, PA-C  metFORMIN (GLUCOPHAGE) 500 MG tablet Take 1 tablet (500 mg total) by mouth 2 (two) times daily with a meal. 12/28/17 12/28/18 Yes Angiulli, Mcarthur Rossetti, PA-C  metoprolol succinate (TOPROL-XL) 25 MG 24 hr tablet Take 1 tablet (25 mg total) by mouth daily. 12/28/17  Yes Angiulli, Mcarthur Rossetti, PA-C  omeprazole (PRILOSEC) 20 MG capsule Take 20 mg by mouth at bedtime.   Yes [provider]  ondansetron (ZOFRAN) 4 MG tablet Take 4 mg by mouth every 8 (eight) hours as needed for nausea or vomiting.   Yes  [provider]  potassium chloride (K-DUR) 10 MEQ tablet Take 20 mEq by mouth daily. 01/16/18  Yes [provider]  traMADol (ULTRAM) 50 MG tablet Take 2 tablets (100 mg total) by mouth every 6 (six) hours as needed for moderate pain. 12/28/17  Yes Angiulli, Mcarthur Rossetti, PA-C  diltiazem (CARDIZEM) 120 MG tablet Take 1 tablet (120 mg total) by mouth every 6 (six) hours. Patient not taking: Reported on 01/23/2018 12/28/17   Angiulli, Mcarthur Rossetti, PA-C    Family History Family History  Family history unknown: Yes    Social History Social History   Tobacco Use  . Smoking status: Never Smoker  . Smokeless tobacco: Never Used  Substance Use Topics  . Alcohol use: No  . Drug use: No     Allergies   Asa [aspirin]; Hydrocodone-acetaminophen; Ibuprofen; and Morphine and related   Review of Systems Review of Systems 10 Systems reviewed and are negative for acute change except as noted in the HPI.   Physical Exam Updated Vital Signs BP 131/81   Pulse (!) 108   Temp 98.5 F (36.9 C) (Oral)   Resp (!) 23   LMP 01/16/2018 (Within Days)   SpO2 98%   Physical Exam  Constitutional: She is oriented to person, place, and time.  Patient appears to be in pain.  She is keeping a washcloth over her eyes and forehead.  She is alert with clear mental status.  No respiratory distress.  Eyes: EOM are normal. Pupils are equal, round, and reactive to light.  She complains of much light sensitivity with exam.  Symmetric and responsive.  Neck: Neck supple.  Cardiovascular:   Tachycardia.  No rub murmur gallop.  Pulmonary/Chest: Effort normal and breath sounds normal.  Abdominal: Soft. She exhibits no distension. There is no tenderness. There is no guarding.  Musculoskeletal: Normal range of motion. She exhibits no edema or tenderness.  Neurological: She is alert and oriented to person, place, and time. No cranial nerve deficit. She exhibits normal muscle tone. Coordination normal.    Skin: Skin is warm and dry.  Psychiatric:  On initial evaluation anxious and in pain.     ED Treatments / Results  Labs (all labs ordered are listed, but only abnormal results are displayed) Labs Reviewed  CBC - Abnormal; Notable for the following components:      Result Value   WBC 13.3 (*)    Hemoglobin 10.6 (*)    HCT 33.9 (*)    MCV 77.2 (*)    MCH 24.1 (*)    All other components within normal limits  DIFFERENTIAL - Abnormal; Notable for the following components:   Neutro Abs 10.2 (*)    All other components within normal limits  COMPREHENSIVE METABOLIC PANEL - Abnormal;  Notable for the following components:   Sodium 133 (*)    Potassium 2.7 (*)    Chloride 94 (*)    Glucose, Bld 140 (*)    BUN 22 (*)    Creatinine, Ser 1.02 (*)    Total Protein 8.5 (*)    All other components within normal limits  CBG MONITORING, ED - Abnormal; Notable for the following components:   Glucose-Capillary 124 (*)    All other components within normal limits  I-STAT CHEM 8, ED - Abnormal; Notable for the following components:   Potassium 2.8 (*)    Chloride 93 (*)    BUN 24 (*)    Glucose, Bld 141 (*)    Calcium, Ion 1.02 (*)    Hemoglobin 11.9 (*)    HCT 35.0 (*)    All other components within normal limits  I-STAT ARTERIAL BLOOD GAS, ED - Abnormal; Notable for the following components:   pO2, Arterial 76.0 (*)    Acid-Base Excess 3.0 (*)    All other components within normal limits  PROTIME-INR  APTT  BLOOD GAS, ARTERIAL  I-STAT TROPONIN, ED  I-STAT BETA HCG BLOOD, ED ([facility], [facility], [facility] ONLY)    EKG  EKG Interpretation  Date/Time:  Monday January 23 2018 11:01:01 EDT Ventricular Rate:  120 PR Interval:    QRS Duration: 84 QT Interval:  410 QTC Calculation: 579 R Axis:   69 Text Interpretation:  sinus tach. no STEMI, no old comparison Confirmed by Arby Barrette [phone]) on 01/23/2018 12:23:56 PM       Radiology Ct Head Wo Contrast  Result Date: 01/23/2018 CLINICAL  DATA:  Acute onset severe headache EXAM: CT HEAD WITHOUT CONTRAST TECHNIQUE: Contiguous axial images were obtained from the base of the skull through the vertex without intravenous contrast. COMPARISON:  Brain MRI December 19, 2017 and head CT December 23, 2017 FINDINGS: Brain: The ventricles are normal in size and configuration. Fourth ventricle is in the midline. There is extensive decreased attenuation throughout the inferior to mid left cerebellar hemisphere with extension to the level of the fourth ventricle on the right inferiorly, stable. There is a small infarct involving a portion of the dentate nucleus of the right cerebellum. This infarct in the right cerebellum now also involves a focus of territory immediately superior and medial to the dentate nucleus on the right, indicative of a degree of progression since the previous study. The degree of decreased attenuation in the left cerebellum is stable. There is no well-defined mass or hemorrhage. There is no extra-axial fluid collection or midline shift. The supratentorial gray-white compartment regions appear stable without focal infarct noted in the supratentorial regions. Vascular: There is no appreciable hyperdense vessel. There is calcification in the distal left vertebral artery. Skull: Bony calvarium appears intact. Sinuses/Orbits: There is opacification of multiple ethmoid air cells bilaterally. There is mucosal thickening in the sphenoid sinuses bilaterally as well as in the inferior frontal sinuses. There are retention cysts in both maxillary antra, larger on the left than on the right. Orbits appear symmetric bilaterally. Other: Mastoid air cells are clear. IMPRESSION: Extensive infarct involving the posterior to mid left cerebellar hemisphere, stable. Infarct in the mid to superior right cerebellar hemisphere with progression of infarct more superiorly and medially in the right cerebellum compared to recent prior studies. No other more recent  appearing infarct compared to prior studies. No mass or hemorrhage. Calcification noted in the left distal vertebral artery. Multifocal paranasal sinus disease present. Electronically Signed  By: Bretta Bang III M.D.   On: 01/23/2018 15:11   Ct Angio Chest Pe W And/or Wo Contrast  Result Date: 01/23/2018 CLINICAL DATA:  Chest pain and shortness of breath for 1 day EXAM: CT ANGIOGRAPHY CHEST WITH CONTRAST TECHNIQUE: Multidetector CT imaging of the chest was performed using the standard protocol during bolus administration of intravenous contrast. Multiplanar CT image reconstructions and MIPs were obtained to evaluate the vascular anatomy. CONTRAST:  ISOVUE-370 COMPARISON:  12/19/2017 chest x-ray FINDINGS: Cardiovascular: Thoracic aorta is well visualize with mild atherosclerotic calcifications. No aneurysmal dilatation or dissection is seen. Some noncalcified atherosclerotic plaque is noted at the level of the aortic hiatus without focal stenosis. Heart is at the upper limits of normal in size. The pulmonary artery shows a normal branching pattern without definitive filling defect to suggest pulmonary embolism. Mediastinum/Nodes: Thoracic inlet is within normal limits. No hilar or mediastinal adenopathy is noted. The esophagus is within normal limits. Lungs/Pleura: Mild dependent changes are noted without focal infiltrate or sizable effusion. No pneumothorax is seen. Upper Abdomen: Visualized upper abdomen is unremarkable. Musculoskeletal: The osseous structures show degenerative change of the thoracic spine. No acute bony abnormality is noted. Review of the MIP images confirms the above findings. IMPRESSION: No evidence of pulmonary emboli. Mild dependent changes in the lungs bilaterally. Electronically Signed   By: Alcide Clever M.D.   On: 01/23/2018 15:13    Procedures Procedures (including critical care time) CRITICAL CARE Performed by: Cristy Friedlander   Total critical care time:45  minutes  Critical care time was exclusive of separately billable procedures and treating other patients.  Critical care was necessary to treat or prevent imminent or life-threatening deterioration.  Critical care was time spent personally by me on the following activities: development of treatment plan with patient and/or surrogate as well as nursing, discussions with consultants, evaluation of patient's response to treatment, examination of patient, obtaining history from patient or surrogate, ordering and performing treatments and interventions, ordering and review of laboratory studies, ordering and review of radiographic studies, pulse oximetry and re-evaluation of patient's condition. Medications Ordered in ED Medications  potassium chloride 10 mEq in 100 mL IVPB (0 mEq Intravenous Stopped 01/23/18 1426)  0.9 % NaCl with KCl 20 mEq/ L  infusion ( Intravenous Stopped 01/23/18 1430)  prochlorperazine (COMPAZINE) injection 10 mg (10 mg Intravenous Given 01/23/18 1325)  diphenhydrAMINE (BENADRYL) injection 25 mg (25 mg Intravenous Given 01/23/18 1324)  ketorolac (TORADOL) 30 MG/ML injection 30 mg (30 mg Intravenous Given 01/23/18 1324)  potassium chloride SA (K-DUR,KLOR-CON) CR tablet 40 mEq (40 mEq Oral Given 01/23/18 1603)  iopamidol (ISOVUE-370) 76 % injection (100 mLs  Contrast Given 01/23/18 1421)     Initial Impression / Assessment and Plan / ED Course  I have reviewed the triage vital signs and the nursing notes.  Pertinent labs & imaging results that were available during my care of the patient were reviewed by me and considered in my medical decision making (see chart for details).      Patient general appearance is much improved after migraine cocktail.  He reported headache is nearly resolved.  Neurologic exam retested, patient has intact finger-nose exam, 5\5 upper and lower motor strength, intact cognitive function.  Consult: Reviewed with neurology.  Will get repeat MRI to further  assess for any change in cerebellar stroke with edema or new stroke territory.  Final Clinical Impressions(s) / ED Diagnoses   Final diagnoses:  Severe headache  History of cerebellar stroke  Hypokalemia  Hypoxia  Has complex medical history with presentation of severe headache.  He had known migraine history and felt headache was typical for migraine.  This is treated with migraine cocktail with good response.  Notably however patient has similar presentation with multiple cerebellar infarct and vomiting and diarrhea.  Notably, patient had several recurrent episodes of very low oxygen saturation reading on monitor.  I did go and observe myself to see that she had good pleth reading that correlated well with heart rate.  Oxygen saturations were noted to be as low as in the 70s.  Patient denied any perceived SOB with this.  There was no change in her blood pressure, heart rate or mental status.  As I would stand and watch her and encourage deep breathing, and saturation normalized without supplemental oxygen.  This happened on several occasions.  This was before she had any medications administered.  The patient may have been resting just before coming in the room but she was not somnolent and mental status was clear.  Patient reports he does have known history of sleep apnea.  It is unclear if at times she is falling asleep and becoming significantly hypoxic (without any associated sedative medications administered in the emergency department).  Patients clinical picture did not correlate with measurement however ABG did show oxygen level of 76 mmHg on 2 L supplemental oxygen.  CT PE study obtained and negative.  Patient presents with hypokalemia as well.  Supplemental potassium initiated via IV as well as orally once headache and nausea control.  Neurology recommends repeat MRI.  Plan will be for observation admission.  ED Discharge Orders    None       Arby Barrette, MD 01/23/18 [phone]

## 2018-01-23 NOTE — ED Notes (Signed)
Nicole [name] from MRI reports pt had a panic attack on the table.  She is not able to get the test done.

## 2018-01-24 ENCOUNTER — Observation Stay (HOSPITAL_COMMUNITY)

## 2018-01-24 ENCOUNTER — Other Ambulatory Visit: Payer: Self-pay

## 2018-01-24 DIAGNOSIS — Z9989 Dependence on other enabling machines and devices: Secondary | ICD-10-CM | POA: Diagnosis not present

## 2018-01-24 DIAGNOSIS — I639 Cerebral infarction, unspecified: Secondary | ICD-10-CM | POA: Diagnosis not present

## 2018-01-24 DIAGNOSIS — E669 Obesity, unspecified: Secondary | ICD-10-CM | POA: Diagnosis not present

## 2018-01-24 DIAGNOSIS — E876 Hypokalemia: Secondary | ICD-10-CM

## 2018-01-24 DIAGNOSIS — E1169 Type 2 diabetes mellitus with other specified complication: Secondary | ICD-10-CM

## 2018-01-24 DIAGNOSIS — R51 Headache: Secondary | ICD-10-CM

## 2018-01-24 DIAGNOSIS — G43709 Chronic migraine without aura, not intractable, without status migrainosus: Secondary | ICD-10-CM

## 2018-01-24 DIAGNOSIS — G4733 Obstructive sleep apnea (adult) (pediatric): Secondary | ICD-10-CM | POA: Diagnosis not present

## 2018-01-24 DIAGNOSIS — J9601 Acute respiratory failure with hypoxia: Secondary | ICD-10-CM

## 2018-01-24 LAB — IRON AND TIBC
Iron: 21 ug/dL — ABNORMAL LOW (ref 28–170)
SATURATION RATIOS: 7 % — AB (ref 10.4–31.8)
TIBC: 297 ug/dL (ref 250–450)
UIBC: 276 ug/dL

## 2018-01-24 LAB — RETICULOCYTES
RBC.: 3.72 MIL/uL — AB (ref 3.87–5.11)
Retic Count, Absolute: 111.6 10*3/uL (ref 19.0–186.0)
Retic Ct Pct: 3 % (ref 0.4–3.1)

## 2018-01-24 LAB — BASIC METABOLIC PANEL
ANION GAP: 10 (ref 5–15)
Anion gap: 9 (ref 5–15)
BUN: 16 mg/dL (ref 6–20)
BUN: 15 mg/dL (ref 6–20)
CALCIUM: 8.6 mg/dL — AB (ref 8.9–10.3)
CHLORIDE: 101 mmol/L (ref 101–111)
CO2: 27 mmol/L (ref 22–32)
CO2: 27 mmol/L (ref 22–32)
CREATININE: 0.77 mg/dL (ref 0.44–1.00)
CREATININE: 0.76 mg/dL (ref 0.44–1.00)
Calcium: 8.6 mg/dL — ABNORMAL LOW (ref 8.9–10.3)
Chloride: 100 mmol/L — ABNORMAL LOW (ref 101–111)
GFR calc Af Amer: >60 mL/min (ref >60)
GFR calc non Af Amer: >60 mL/min (ref >60)
Glucose, Bld: 105 mg/dL — ABNORMAL HIGH (ref 65–99)
Glucose, Bld: 103 mg/dL — ABNORMAL HIGH (ref 65–99)
Potassium: 3 mmol/L — ABNORMAL LOW (ref 3.5–5.1)
Potassium: 3.6 mmol/L (ref 3.5–5.1)
SODIUM: 137 mmol/L (ref 135–145)
SODIUM: 137 mmol/L (ref 135–145)

## 2018-01-24 LAB — CBC
HCT: 29.1 % — ABNORMAL LOW (ref 36.0–46.0)
HEMOGLOBIN: 9.1 g/dL — AB (ref 12.0–15.0)
MCH: 24.5 pg — ABNORMAL LOW (ref 26.0–34.0)
MCHC: 31.3 g/dL (ref 30.0–36.0)
MCV: 78.2 fL (ref 78.0–100.0)
PLATELETS: 220 10*3/uL (ref 150–400)
RBC: 3.72 MIL/uL — AB (ref 3.87–5.11)
RDW: 15.6 % — ABNORMAL HIGH (ref 11.5–15.5)
WBC: 10.2 10*3/uL (ref 4.0–10.5)

## 2018-01-24 LAB — FERRITIN: Ferritin: 25 ng/mL (ref 11–307)

## 2018-01-24 LAB — BLOOD GAS, ARTERIAL
Acid-Base Excess: 3.8 mmol/L — ABNORMAL HIGH (ref 0.0–2.0)
Bicarbonate: 28.5 mmol/L — ABNORMAL HIGH (ref 20.0–28.0)
DELIVERY SYSTEMS: POSITIVE
DRAWN BY: 520781
Expiratory PAP: 5
MODE: POSITIVE
O2 Content: 3 L/min
O2 SAT: 96.7 %
PCO2 ART: 48.3 mmHg — AB (ref 32.0–48.0)
PH ART: 7.388 (ref 7.350–7.450)
PO2 ART: 89 mmHg (ref 83.0–108.0)
Patient temperature: 98.6

## 2018-01-24 LAB — TSH: TSH: 1.168 u[IU]/mL (ref 0.350–4.500)

## 2018-01-24 LAB — VITAMIN B12: VITAMIN B 12: 336 pg/mL (ref 180–914)

## 2018-01-24 LAB — FOLATE: FOLATE: 16.2 ng/mL (ref >5.9)

## 2018-01-24 MED ORDER — CHLORHEXIDINE GLUCONATE 0.12 % MT SOLN
15.0000 mL | Freq: Two times a day (BID) | OROMUCOSAL | Status: DC
  Administered 2018-01-24: 15 mL via OROMUCOSAL

## 2018-01-24 MED ORDER — DILTIAZEM HCL ER COATED BEADS 240 MG PO CP24
240.0000 mg | ORAL_CAPSULE | Freq: Every day | ORAL | Status: DC
  Administered 2018-01-24: 240 mg via ORAL
  Filled 2018-01-24: qty 1

## 2018-01-24 MED ORDER — LORAZEPAM 2 MG/ML IJ SOLN
1.0000 mg | Freq: Once | INTRAMUSCULAR | Status: DC
Start: 2018-01-24 — End: 2018-01-24

## 2018-01-24 MED ORDER — POTASSIUM CHLORIDE CRYS ER 20 MEQ PO TBCR
40.0000 meq | EXTENDED_RELEASE_TABLET | Freq: Once | ORAL | Status: AC
  Administered 2018-01-24: 40 meq via ORAL
  Filled 2018-01-24: qty 2

## 2018-01-24 MED ORDER — ORAL CARE MOUTH RINSE
15.0000 mL | Freq: Two times a day (BID) | OROMUCOSAL | Status: DC
  Administered 2018-01-24: 15 mL via OROMUCOSAL

## 2018-01-24 MED ORDER — POTASSIUM CHLORIDE CRYS ER 20 MEQ PO TBCR
20.0000 meq | EXTENDED_RELEASE_TABLET | Freq: Once | ORAL | Status: AC
  Administered 2018-01-24: 20 meq via ORAL
  Filled 2018-01-24: qty 1

## 2018-01-24 MED ORDER — FERROUS GLUCONATE 324 (38 FE) MG PO TABS: 324.0000 mg | ORAL_TABLET | Freq: Two times a day (BID) | ORAL | 0 refills | Status: DC

## 2018-01-24 MED ORDER — FERROUS GLUCONATE 324 (38 FE) MG PO TABS
324.0000 mg | ORAL_TABLET | Freq: Two times a day (BID) | ORAL | Status: DC
  Administered 2018-01-24: 324 mg via ORAL
  Filled 2018-01-24: qty 1

## 2018-01-24 NOTE — Progress Notes (Signed)
Pt being discharged from hospital per orders from MD. Pt educated on discharge instructions. Pt verbalized understanding of instructions. All questions and concerns were addressed. Pt's IV was removed prior to discharge. Pt exited hospital via wheelchair accompanied by staff. 

## 2018-01-24 NOTE — Progress Notes (Signed)
Pt arrived to unit and ambulated to bed without difficulty. No c/o pain or discomfort at this time. Welcomed and oriented to unit. Will continue to monitor.

## 2018-01-24 NOTE — Discharge Instructions (Signed)
Please stop taking your HCTZ, Lasix, Metoprolol, Cartia for now. Measure BP daily and take the record of your BP to your doctor's office in 1 wk.  Potassium can be stopped as you will not be taking HCTZ and Lasix.  Ask your PCP to check your Bmet in [redacted] wk along with your BP.

## 2018-01-24 NOTE — Discharge Summary (Signed)
Physician Discharge Summary  Nicole [name] ACZ:[id number] DOB: 1966-10-19 DOA: 01/23/2018  PCP: Verl Bangs, MD  Admit date: 01/23/2018 Discharge date: 01/24/2018  Admitted From: home Disposition:  home   Recommendations for Outpatient Follow-up:  1. 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  HPI: Nicole [name] is a 52 y.o. female with medical history significant for obesity, sleep apnea, hypertension, dyslipidemia, and newly diagnosed diabetes.  Patient was hospitalized last month in the context of significant acute stroke.  She was found to have bilateral acute infarctions involving the cerebellum primarily on the left with MRI confirming left PICA distribution with edema and local mass-effect.  She was critically ill and placed initially in the ICU.  Primary deficits were related to gait disturbance and ataxia as well as dizziness.  She subsequently went to CIR and was discharged from rehab on 2/12.  She has since followed up with the rehab physician in the outpatient setting on 2/22 and has been progressing well complaining primarily of mild a.m. headache relieved with Tylenol, she is able to perform basic ADLs at home including sweeping and vacuuming.  She has fallen once while she was trying to perform household chores while not utilizing a walker.  She presented today after having an unrelenting headache for 3 days associated with  vomiting.  So was having some bilateral leg numbness.  Because this was similar to her presentation for her initial stroke she opted to come to the ER.  Hospital Course:  Complicated Migraine - Headache has resolved with treatment- neurological symptoms improved also - MRI ordered but she is not able to have it done due to claustrophobia- she also feels that she does not need it - neurology recommends following BP closely at home and preventing hypotension- Dr Roda Shutters recommends to stop antihypertensive   Acute Hypoxia - apparently pulse ox was in 70s in the ER- ? hypoventilation in setting of severe migraine - cause uncertain but it has resolved today  Hypokalemia - replaced- she is on both Lasix and HCTZ at home-   - last ECHO from 2/5 showed a normal EF and grade 1 d CHF - will recommend she hold both and f/u with PCP in 1 wk for BP check  Iron deficiency - low Iron levels noted along with anemia- she states she a h/o menorrhagia and had ablation done recently- will start oral Iron - have started Ferrous Gluconate instead for Ferrous sulfate which she states causes GI issues   Discharge Exam: Vitals:   01/24/18 0842 01/24/18 1250  BP: 124/67 119/73  Pulse: 94 95  Resp: 20 18  Temp: 98.2 F (36.8 C) 98.1 F (36.7 C)  SpO2: 95% 93%   Vitals:   01/24/18 0037 01/24/18 0400 01/24/18 0842 01/24/18 1250  BP:  127/74 124/67 119/73  Pulse: 100 93 94 95  Resp: 19 20 20 18   Temp:  98.7 F (37.1 C) 98.2 F (36.8  C) 98.1 F (36.7 C)  TempSrc:  Axillary Oral Oral  SpO2: 99% 92% 95% 93%    General: Pt is alert, awake, not in acute distress Cardiovascular: RRR, S1/S2 +, no rubs, no gallops Respiratory: CTA bilaterally, no wheezing, no rhonchi Abdominal: Soft, NT, ND, bowel sounds + Extremities: no edema, no cyanosis   Discharge Instructions  Discharge Instructions    Ambulatory referral to Neurology   Complete by:  As directed    An appointment is requested in approximately:  6 weeks Follow up with stroke clinic NP (Jessica Vanschaick or Darrol Angel, if both not available, consider Manson Allan, or Ahern) at [facility] in about 4 weeks. Thanks.   Diet - low sodium heart healthy   Complete by:  As directed    Diet Carb Modified   Complete by:  As directed    Increase activity slowly   Complete by:  As directed      Allergies as of 01/24/2018      Reactions   Asa [aspirin] Other (See Comments)   Contraindication per provider - pt states not allergic, PCP stated did not go with treatment regimen, ok for pt to take medication   Hydrocodone-acetaminophen Other (See Comments)   headache   Ibuprofen Nausea Only   Morphine And Related Palpitations      Medication List    STOP taking these medications   CARTIA XT 240 MG 24 hr capsule Generic drug:  diltiazem   diltiazem 120 MG tablet Commonly known as:  CARDIZEM   furosemide 20 MG tablet Commonly known as:  LASIX   hydrochlorothiazide 25 MG tablet Commonly known as:  HYDRODIURIL   metoprolol succinate 25 MG 24 hr tablet Commonly known as:  TOPROL-XL   potassium chloride 10 MEQ tablet Commonly known as:  K-DUR     TAKE these medications   acetaminophen 325 MG tablet Commonly known as:  TYLENOL Take 650 mg by mouth every 6 (six) hours as needed for mild pain.   aspirin 81 MG EC tablet Take 1 tablet (81 mg total) by mouth daily.   atorvastatin 80 MG tablet Commonly known as:  LIPITOR Take 1 tablet (80 mg total) by mouth daily.   butalbital-acetaminophen-caffeine 50-325-40 MG tablet Commonly known as:  FIORICET, ESGIC Take 1 tablet by mouth every 6 (six) hours as needed for headache or migraine.   ferrous gluconate 324 MG tablet Commonly known as:  FERGON Take 1 tablet (324 mg total) by mouth 2 (two) times daily with a meal.   FLUoxetine 20 MG tablet Commonly known as:  PROZAC Take 2 tablets (40 mg total) by mouth 2 (two) times daily.   lidocaine 5 % Commonly known as:  LIDODERM Place  1 patch onto the skin daily. Remove & Discard patch within 12 hours or as directed by MD   meclizine 12.5 MG tablet Commonly known as:  ANTIVERT Take 1 tablet (12.5 mg total) by mouth 3 (three) times daily as needed for dizziness.   metFORMIN 500 MG tablet Commonly known as:  GLUCOPHAGE Take 1 tablet (500 mg total) by mouth 2 (two) times daily with a meal.   omeprazole 20 MG capsule Commonly known as:  PRILOSEC Take 20 mg by mouth at bedtime.   ondansetron 4 MG tablet Commonly known as:  ZOFRAN Take 4 mg by mouth every 8 (eight) hours as needed for nausea or vomiting.   traMADol 50 MG tablet Commonly known as:  ULTRAM Take 2 tablets (100 mg total) by mouth  every 6 (six) hours as needed for moderate pain.   VENTOLIN HFA 108 (90 Base) MCG/ACT inhaler Generic drug:  albuterol Inhale 2 puffs into the lungs every 4 (four) hours as needed for wheezing.      Follow-up Information    Micki Riley, MD. Schedule an appointment as soon as possible for a visit in 6 week(s).   Specialties:  Neurology, Radiology Contact information: [address] [place] [address] [phone]          Allergies  Allergen Reactions  . Asa [Aspirin] Other (See Comments)    Contraindication per provider - pt states not allergic, PCP stated did not go with treatment regimen, ok for pt to take medication  . Hydrocodone-Acetaminophen Other (See Comments)    headache  . Ibuprofen Nausea Only  . Morphine And Related Palpitations     Procedures/Studies:  Dg Chest 2 View  Result Date: 01/24/2018 CLINICAL DATA:  Hypoxia.  Asthma. EXAM: CHEST - 2 VIEW COMPARISON:  None. FINDINGS: The heart size and mediastinal contours are within normal limits. Both lungs are clear. The visualized skeletal structures are unremarkable. IMPRESSION: Negative two view chest x-ray Electronically Signed   By: Marin Roberts M.D.   On: 01/24/2018 09:24   Ct Head Wo Contrast  Result Date:  01/23/2018 CLINICAL DATA:  Acute onset severe headache EXAM: CT HEAD WITHOUT CONTRAST TECHNIQUE: Contiguous axial images were obtained from the base of the skull through the vertex without intravenous contrast. COMPARISON:  Brain MRI December 19, 2017 and head CT December 23, 2017 FINDINGS: Brain: The ventricles are normal in size and configuration. Fourth ventricle is in the midline. There is extensive decreased attenuation throughout the inferior to mid left cerebellar hemisphere with extension to the level of the fourth ventricle on the right inferiorly, stable. There is a small infarct involving a portion of the dentate nucleus of the right cerebellum. This infarct in the right cerebellum now also involves a focus of territory immediately superior and medial to the dentate nucleus on the right, indicative of a degree of progression since the previous study. The degree of decreased attenuation in the left cerebellum is stable. There is no well-defined mass or hemorrhage. There is no extra-axial fluid collection or midline shift. The supratentorial gray-white compartment regions appear stable without focal infarct noted in the supratentorial regions. Vascular: There is no appreciable hyperdense vessel. There is calcification in the distal left vertebral artery. Skull: Bony calvarium appears intact. Sinuses/Orbits: There is opacification of multiple ethmoid air cells bilaterally. There is mucosal thickening in the sphenoid sinuses bilaterally as well as in the inferior frontal sinuses. There are retention cysts in both maxillary antra, larger on the left than on the right. Orbits appear symmetric bilaterally. Other: Mastoid air cells are clear. IMPRESSION: Extensive infarct involving the posterior to mid left cerebellar hemisphere, stable. Infarct in the mid to superior right cerebellar hemisphere with progression of infarct more superiorly and medially in the right cerebellum compared to recent prior studies. No  other more recent appearing infarct compared to prior studies. No mass or hemorrhage. Calcification noted in the left distal vertebral artery. Multifocal paranasal sinus disease present. Electronically Signed   By: Bretta Bang III M.D.   On: 01/23/2018 15:11   Ct Angio Chest Pe W And/or Wo Contrast  Result Date: 01/23/2018 CLINICAL DATA:  Chest pain and shortness of breath for 1 day EXAM: CT ANGIOGRAPHY CHEST WITH CONTRAST TECHNIQUE: Multidetector CT imaging of the chest was performed  using the standard protocol during bolus administration of intravenous contrast. Multiplanar CT image reconstructions and MIPs were obtained to evaluate the vascular anatomy. CONTRAST:  [id number] COMPARISON:  12/19/2017 chest x-ray FINDINGS: Cardiovascular: Thoracic aorta is well visualize with mild atherosclerotic calcifications. No aneurysmal dilatation or dissection is seen. Some noncalcified atherosclerotic plaque is noted at the level of the aortic hiatus without focal stenosis. Heart is at the upper limits of normal in size. The pulmonary artery shows a normal branching pattern without definitive filling defect to suggest pulmonary embolism. Mediastinum/Nodes: Thoracic inlet is within normal limits. No hilar or mediastinal adenopathy is noted. The esophagus is within normal limits. Lungs/Pleura: Mild dependent changes are noted without focal infiltrate or sizable effusion. No pneumothorax is seen. Upper Abdomen: Visualized upper abdomen is unremarkable. Musculoskeletal: The osseous structures show degenerative change of the thoracic spine. No acute bony abnormality is noted. Review of the MIP images confirms the above findings. IMPRESSION: No evidence of pulmonary emboli. Mild dependent changes in the lungs bilaterally. Electronically Signed   By: Alcide Clever M.D.   On: 01/23/2018 15:13     The results of significant diagnostics from this hospitalization (including imaging, microbiology, ancillary and  laboratory) are listed below for reference.     Microbiology: No results found for this or any previous visit (from the past 240 hour(s)).   Labs: BNP (last 3 results) No results for input(s): BNP in the last 8760 hours. Basic Metabolic Panel: Recent Labs  Lab 01/23/18 1122 01/23/18 1148 01/24/18 0415 01/24/18 1538  NA 133* 136 137 137  K 2.7* 2.8* 3.0* 3.6  CL 94* 93* 100* 101  CO2 25  --  27 27  GLUCOSE 140* 141* 105* 103*  BUN 22* 24* 16 15  CREATININE 1.02* 1.00 0.77 0.76  CALCIUM 9.2  --  8.6* 8.6*   Liver Function Tests: Recent Labs  Lab 01/23/18 1122  AST 21  ALT 17  ALKPHOS 83  BILITOT 0.9  PROT 8.5*  ALBUMIN 3.8   No results for input(s): LIPASE, AMYLASE in the last 168 hours. No results for input(s): AMMONIA in the last 168 hours. CBC: Recent Labs  Lab 01/23/18 1122 01/23/18 1148 01/24/18 0415  WBC 13.3*  --  10.2  NEUTROABS 10.2*  --   --   HGB 10.6* 11.9* 9.1*  HCT 33.9* 35.0* 29.1*  MCV 77.2*  --  78.2  PLT 244  --  220   Cardiac Enzymes: No results for input(s): CKTOTAL, CKMB, CKMBINDEX, TROPONINI in the last 168 hours. BNP: Invalid input(s): POCBNP CBG: Recent Labs  Lab 01/23/18 1137  GLUCAP 124*   D-Dimer No results for input(s): DDIMER in the last 72 hours. Hgb A1c No results for input(s): HGBA1C in the last 72 hours. Lipid Profile No results for input(s): CHOL, HDL, LDLCALC, TRIG, CHOLHDL, LDLDIRECT in the last 72 hours. Thyroid function studies Recent Labs    01/24/18 0014  TSH 1.168   Anemia work up Recent Labs    01/24/18 0415  VITAMINB12 336  FOLATE 16.2  FERRITIN 25  TIBC 297  IRON 21*  RETICCTPCT 3.0   Urinalysis    Component Value Date/Time   COLORURINE AMBER (A) 12/18/2017 1455   APPEARANCEUR CLOUDY (A) 12/18/2017 1455   LABSPEC 1.028 12/18/2017 1455   PHURINE 6.0 12/18/2017 1455   GLUCOSEU NEGATIVE 12/18/2017 1455   HGBUR SMALL (A) 12/18/2017 1455   BILIRUBINUR NEGATIVE 12/18/2017 1455   KETONESUR  NEGATIVE 12/18/2017 1455   PROTEINUR 100 (A)  12/18/2017 1455   UROBILINOGEN 1.0 06/26/2015 1708   NITRITE NEGATIVE 12/18/2017 1455   LEUKOCYTESUR NEGATIVE 12/18/2017 1455   Sepsis Labs Invalid input(s): PROCALCITONIN,  WBC,  LACTICIDVEN Microbiology No results found for this or any previous visit (from the past 240 hour(s)).   Time coordinating discharge: Over 30 minutes  SIGNED:   Calvert Cantor, MD  [facility] 01/24/2018, 5:18 PM Pager   If 7PM-7AM, please contact night-coverage [email or web address] Password [email or web address]

## 2018-01-24 NOTE — Progress Notes (Addendum)
NEUROHOSPITALISTS STROKE TEAM - DAILY PROGRESS NOTE   SUBJECTIVE (INTERVAL HISTORY) Husband is at the bedside. Patient is found laying in bed in NAD. Overall she feels her condition is completely resolved. Denies any H/A, numbness or weakness on exam today. Voices no new complaints. No new events reported overnight.  Patient states she had been feeling sick for 3 days after her DM medication were changed. She was not eating or drinking normally due to Nausea/V. She was taking all of her Rx medications despite feeling nauseous.   Laboratory Results  CBC:  Recent Labs  Lab 01/23/18 1122 01/23/18 1148 01/24/18 0415  WBC 13.3*  --  10.2  HGB 10.6* 11.9* 9.1*  HCT 33.9* 35.0* 29.1*  MCV 77.2*  --  78.2  PLT 244  --  220   BMP: Recent Labs  Lab 01/23/18 1122 01/23/18 1148 01/24/18 0415  NA 133* 136 137  K 2.7* 2.8* 3.0*  CL 94* 93* 100*  CO2 25  --  27  GLUCOSE 140* 141* 105*  BUN 22* 24* 16  CREATININE 1.02* 1.00 0.77  CALCIUM 9.2  --  8.6*   Thyroid Function Studies:  Recent Labs    01/24/18 0014  TSH 1.168   Coagulation Studies:  Recent Labs    01/23/18 1122  APTT 27  INR 1.10   [place] Work -Up:  Recent Labs    01/24/18 0415  VITAMINB12 336  FOLATE 16.2  IRON 21*  RETICCTPCT 3.0    Physical Examination   Vitals:   01/24/18 0037 01/24/18 0400 01/24/18 0842 01/24/18 1250  BP:  127/74 124/67 119/73  Pulse: 100 93 94 95  Resp: 19 20 20 18   Temp:  98.7 F (37.1 C) 98.2 F (36.8 C) 98.1 F (36.7 C)  TempSrc:  Axillary Oral Oral  SpO2: 99% 92% 95% 93%   General - Well nourished, well developed, in no apparent distress HEENT-  Normocephalic,  Cardiovascular - Regular rate and rhythm  Respiratory - Lungs clear bilaterally. No wheezing. Abdomen - soft and non-tender, BS normal Extremities- no edema or cyanosis  Neurological Examination  Mental Status: Alert, oriented, thought content  appropriate.  Speech fluent without evidence of aphasia.  Able to follow 3 step commands without difficulty. Cranial Nerves: II: Visual fields grossly normal,  III,IV, VI: ptosis not present, extra-ocular motions intact bilaterally, pupils equal, round, reactive to light and accommodation V,VII: smile symmetric, facial light touch sensation normal bilaterally VIII: hearing normal bilaterally IX,X: uvula rises symmetrically XI: bilateral shoulder shrug XII: midline tongue extension Motor: Right : Upper extremity   5/5    Left:     Upper extremity   5/5  Lower extremity   5/5     Lower extremity   5/5 Tone and bulk:normal tone throughout; no atrophy noted Sensory: Pinprick and light touch intact throughout, bilaterally Deep Tendon Reflexes: 2+ and symmetric upper extremities with 1+ patellar reflexes and trace achilles bilaterally Plantars: Right: downgoing   Left: downgoing Cerebellar: FNF normal.  Gait: Gait normal, no ataxia noted  Imaging Results  Dg Chest 2 View  Result Date: 01/24/2018 CLINICAL DATA:  Hypoxia.  Asthma. EXAM: CHEST - 2 VIEW COMPARISON:  None. FINDINGS: The heart size and mediastinal contours are within normal limits. Both lungs are clear. The visualized skeletal structures are unremarkable. IMPRESSION: Negative two view chest x-ray Electronically Signed   By: Marin Roberts M.D.   On: 01/24/2018 09:24   Ct Head Wo Contrast  Result Date: 01/23/2018 CLINICAL  DATA:  Acute onset severe headache EXAM: CT HEAD WITHOUT CONTRAST TECHNIQUE: Contiguous axial images were obtained from the base of the skull through the vertex without intravenous contrast. COMPARISON:  Brain MRI December 19, 2017 and head CT December 23, 2017 FINDINGS: Brain: The ventricles are normal in size and configuration. Fourth ventricle is in the midline. There is extensive decreased attenuation throughout the inferior to mid left cerebellar hemisphere with extension to the level of the fourth ventricle  on the right inferiorly, stable. There is a small infarct involving a portion of the dentate nucleus of the right cerebellum. This infarct in the right cerebellum now also involves a focus of territory immediately superior and medial to the dentate nucleus on the right, indicative of a degree of progression since the previous study. The degree of decreased attenuation in the left cerebellum is stable. There is no well-defined mass or hemorrhage. There is no extra-axial fluid collection or midline shift. The supratentorial gray-white compartment regions appear stable without focal infarct noted in the supratentorial regions. Vascular: There is no appreciable hyperdense vessel. There is calcification in the distal left vertebral artery. Skull: Bony calvarium appears intact. Sinuses/Orbits: There is opacification of multiple ethmoid air cells bilaterally. There is mucosal thickening in the sphenoid sinuses bilaterally as well as in the inferior frontal sinuses. There are retention cysts in both maxillary antra, larger on the left than on the right. Orbits appear symmetric bilaterally. Other: Mastoid air cells are clear. IMPRESSION: Extensive infarct involving the posterior to mid left cerebellar hemisphere, stable. Infarct in the mid to superior right cerebellar hemisphere with progression of infarct more superiorly and medially in the right cerebellum compared to recent prior studies. No other more recent appearing infarct compared to prior studies. No mass or hemorrhage. Calcification noted in the left distal vertebral artery. Multifocal paranasal sinus disease present. Electronically Signed   By: Bretta Bang III M.D.   On: 01/23/2018 15:11   Ct Angio Chest Pe W And/or Wo Contrast  Result Date: 01/23/2018 CLINICAL DATA:  Chest pain and shortness of breath for 1 day EXAM: CT ANGIOGRAPHY CHEST WITH CONTRAST TECHNIQUE: Multidetector CT imaging of the chest was performed using the standard protocol during bolus  administration of intravenous contrast. Multiplanar CT image reconstructions and MIPs were obtained to evaluate the vascular anatomy. CONTRAST:  [id number] COMPARISON:  12/19/2017 chest x-ray FINDINGS: Cardiovascular: Thoracic aorta is well visualize with mild atherosclerotic calcifications. No aneurysmal dilatation or dissection is seen. Some noncalcified atherosclerotic plaque is noted at the level of the aortic hiatus without focal stenosis. Heart is at the upper limits of normal in size. The pulmonary artery shows a normal branching pattern without definitive filling defect to suggest pulmonary embolism. Mediastinum/Nodes: Thoracic inlet is within normal limits. No hilar or mediastinal adenopathy is noted. The esophagus is within normal limits. Lungs/Pleura: Mild dependent changes are noted without focal infiltrate or sizable effusion. No pneumothorax is seen. Upper Abdomen: Visualized upper abdomen is unremarkable. Musculoskeletal: The osseous structures show degenerative change of the thoracic spine. No acute bony abnormality is noted. Review of the MIP images confirms the above findings. IMPRESSION: No evidence of pulmonary emboli. Mild dependent changes in the lungs bilaterally. Electronically Signed   By: Alcide Clever M.D.   On: 01/23/2018 15:13   Echocardiogram: 12/20/2017 Study Conclusions - Left ventricle: The cavity size was normal. Wall thickness was   increased in a pattern of moderate LVH. Systolic function was   normal. The estimated ejection fraction was in  the range of 60%   to 65%. Wall motion was normal; there were no regional wall   motion abnormalities. Doppler parameters are consistent with   abnormal left ventricular relaxation (grade 1 diastolic   dysfunction). - Left atrium: The atrium was mildly dilated. - Right ventricle: The cavity size was mildly dilated. Wall   thickness was normal. Impressions:- No cardiac source of emboli was indentified  MRI Head Limited:                                                 PENDING  IMPRESSION AND PLAN  Ms. Nicole [name] is a 52 y.o. 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 CT Head shows possible new subacute CVA. MRI pending.  Extensive infarct- posterior to mid left cerebellar hemisphere, stable Infarct - mid /superior right cerebellar hemisphere with progression of infarct more superiorly and medially in the right cerebellum  Suspected Etiology: cardioembolic versus small vessel Resultant Symptoms: ataxia Stroke Risk Factors: diabetes mellitus, hyperlipidemia and hypertension Other Stroke Risk Factors: Advanced age, Morbid Obesity, Hx CVA,   Outstanding Stroke Work-up Studies:     MRI Head Limited:                                                    PENDING  PLAN 01/24/2018  Continue Aspirin/ Statin, for now May consider hypercoagulable work up Frequent neuro checks Telemetry monitoring PT/OT/SLP May need TEE and Loop Recorder Placement - depending on MRI results Ongoing aggressive stroke risk factor management Patient counseled to be compliant with her antithrombotic medications Patient counseled on Lifestyle modifications including, Diet, Exercise, and Stress Follow up with [facility] in 6 weeks  NEUROLOGICAL  ISSUES  HX OF STROKES: 12/2017 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 Long term BP goal normotensive. May slowly restart home B/P medications after 48 hours Home Meds: yes  HYPERLIPIDEMIA:      Component Value Date/Time   CHOL 140 12/19/2017 0930   TRIG 107 12/19/2017 0930   HDL 34 (L) 12/19/2017 0930   CHOLHDL 4.1 12/19/2017 0930   VLDL 21 12/19/2017 0930   LDLCALC 85 12/19/2017 0930  Home Meds:  Lipitor 80 mg LDL  goal < 70 Continued on  Lipitor to 80 mg daily Continue statin at discharge May add Zetia 10 mg, if at next lipid check LDH greater than 70  DIABETES: Lab Results  Component Value Date   HGBA1C 6.3 (H) 12/19/2017  HgbA1c goal < 7.0 Continue CBG monitoring and SSI to maintain glucose 140-180 mg/dl DM education   OBESITY  Morbid Obesity,     Hospital day # 0 VTE prophylaxis:  SCD's  Diet : Diet heart healthy/carb modified Room service appropriate? Yes; Fluid consistency: Thin  FAMILY UPDATES:  family at bedside  TEAM UPDATES: Calvert Cantor, MD STATUS:    Discharge Information  Prior Home Stroke Medications: aspirin 81 mg daily  Discharge Stroke Meds:  Please discharge patient on TBD ONCE MRI REVIEWED     Disposition: 01-Home or Self Care Therapy Recs:               HOME  Follow up Appointments  Follow Up:  Follow-up Information    Micki Riley, MD. Schedule an appointment as soon as possible for a visit in 6 week(s).   Specialties:  Neurology, Radiology Contact information: [address] [place] [address] [phone]          Verl Bangs, MD -PCP Follow up in 1-2 weeks       Assessment & plan discussed with with attending physician and they are in agreement.    Beryl Meager, ANP-C Stroke Neurology Team 01/24/2018, 2:45 PM  01/24/2018 ATTENDING ASSESSMENT   I reviewed above note and agree with the assessment and plan. I have made any additions or clarifications directly to the above note. Pt was seen and examined.   52 year old female with history of obesity, asthma, migraine, hypertension, hyperlipidemia was admitted 12/18/17 for occipital headache, nausea vomiting and vertigo.  CT showed left  large cerebellum infarct and MRI brain showed large left PICA infarcts as well as right cerebellum PICA punctate infarcts.  CTA head and neck showed left PICA occlusion and right PICA high-grade stenosis with left V4 thrombus/dissection.  EF 60-65%, A1c 6.3 and LDL 85.  She was put on aspirin and Lipitor and discharged to CIR.  This time patient admitted for not feeling well, nausea vomiting for 3 days with headache, not eating well but continue taking BP medication.  Also felt bilateral leg numbness tingling.  CT head showed possible extension of old cerebellar infarct.  MRI not able to perform due to claustrophobia, and the patient refused further testing.  Patient stated that this time event similar to her regular migraine headache, different from her last stroke in 12/2017.  Unfortunately, MRI not able to perform to confirm that.  However, even she had extension of her previous cerebellar stroke, it could be due to hypoperfusion in the setting of bilateral PICA stenosis/occlusion and low BP due to nausea vomiting for 3 days but still taking BP meds.  Her BP this admission 110-120s without BP medication.  She stated that she was not taking Cardizem at home taking metoprolol which was discontinued this time.  Educated patient on BP monitoring at home and PCP follow-up on BP measurements, BP goal 120-140.  Currently patient asymptomatic, continue aspirin and Lipitor on discharge.  Hold off BP meds at home and close BP monitoring at home.   Neurology will sign off. Please call with questions. Pt will follow up with Dr. Pearlean Brownie at [facility] in about 4 weeks. Thanks for the consult.   Marvel Plan, MD PhD Stroke Neurology 01/24/2018 3:24 PM   To contact Stroke Provider, please refer to [email or web address]. After hours, contact General Neurology

## 2018-01-27 ENCOUNTER — Ambulatory Visit: Admitting: Rehabilitation

## 2018-02-03 ENCOUNTER — Encounter: Admitting: Physical Therapy

## 2018-02-10 ENCOUNTER — Encounter: Admitting: Physical Therapy

## 2018-02-17 ENCOUNTER — Ambulatory Visit: Admitting: Rehabilitation

## 2018-02-17 ENCOUNTER — Encounter: Payer: Self-pay | Admitting: Rehabilitation

## 2018-02-17 ENCOUNTER — Ambulatory Visit: Admitting: Physical Medicine & Rehabilitation

## 2018-02-17 DIAGNOSIS — Z8673 Personal history of transient ischemic attack (TIA), and cerebral infarction without residual deficits: Secondary | ICD-10-CM

## 2018-02-17 NOTE — Therapy (Signed)
[facility] [address] [place], [place], [address] Phone: [phone]   Fax:  [phone]  Patient Details  Name: Nicole [name] MRN: [id number] Date of Birth: [birth year] Referring Provider:  No ref. provider found  Encounter Date: 02/17/2018   PHYSICAL THERAPY DISCHARGE SUMMARY  Visits from Start of Care: 1  Current functional level related to goals / functional outcomes: Unsure as she did not return for follow up visits due to financial burden.     Remaining deficits: PT Long Term Goals - 01/02/18 2029      PT LONG TERM GOAL #1   Title  Pt will be independent with HEP in order to indicate decreased fall risk and decreased dizziness.  (Target Date: 03/03/18)    Time  8    Period  Weeks    Status  New    Target Date  03/03/18      PT LONG TERM GOAL #2   Title  Pt will improve FGA by 6 points from baseline in order to indicate decreased fall risk.      Time  8    Period  Weeks    Status  New      PT LONG TERM GOAL #3   Title  Pt will report no more than 3/10 dizziness with functional mobility in order to indicate improved ability to complete ADLs.     Time  8    Period  Weeks    Status  New      PT LONG TERM GOAL #4   Title  Pt will ambulate 500' over unlevel paved outdoor surfaces (including ramp/curb) without AD in order to indicate improved community mobility.      Time  8    Period  Weeks    Status  New      PT LONG TERM GOAL #5   Title  Pt will ambulate 500' in busy environment with no more than 2 point increase in dizziness to indicate improved functional mobility.      Time  8    Period  Weeks    Status  New         Education / Equipment: none  Plan: Patient agrees to discharge.  Patient goals were not met. Patient is being discharged due to not returning since the last visit.  ?????     Cameron Sprang, PT, MPT [facility] [address] [place], [place], [address] Phone: [phone]   Fax:  [phone] 02/17/18, 9:20 AM

## 2018-02-24 ENCOUNTER — Ambulatory Visit: Admitting: Rehabilitation

## 2018-03-02 ENCOUNTER — Ambulatory Visit: Admitting: Rehabilitation

## 2018-05-10 ENCOUNTER — Encounter (HOSPITAL_COMMUNITY): Payer: Self-pay | Admitting: *Deleted

## 2018-05-10 ENCOUNTER — Other Ambulatory Visit: Payer: Self-pay

## 2018-05-10 ENCOUNTER — Emergency Department (HOSPITAL_COMMUNITY)
Admission: EM | Admit: 2018-05-10 | Discharge: 2018-05-11 | Disposition: A | Discharge status: Home / Self Care | Attending: Emergency Medicine | Admitting: Emergency Medicine

## 2018-05-10 DIAGNOSIS — G8929 Other chronic pain: Secondary | ICD-10-CM | POA: Diagnosis not present

## 2018-05-10 DIAGNOSIS — M25561 Pain in right knee: Secondary | ICD-10-CM | POA: Insufficient documentation

## 2018-05-10 DIAGNOSIS — R202 Paresthesia of skin: Secondary | ICD-10-CM | POA: Diagnosis present

## 2018-05-10 LAB — I-STAT CHEM 8, ED
BUN: 21 mg/dL — ABNORMAL HIGH (ref 6–20)
Calcium, Ion: 1.16 mmol/L (ref 1.15–1.40)
Chloride: 96 mmol/L — ABNORMAL LOW (ref 98–111)
Creatinine, Ser: 0.8 mg/dL (ref 0.44–1.00)
Glucose, Bld: 149 mg/dL — ABNORMAL HIGH (ref 70–99)
HCT: 28 % — ABNORMAL LOW (ref 36.0–46.0)
Hemoglobin: 9.5 g/dL — ABNORMAL LOW (ref 12.0–15.0)
Potassium: 3.7 mmol/L (ref 3.5–5.1)
Sodium: 137 mmol/L (ref 135–145)
TCO2: 27 mmol/L (ref 22–32)

## 2018-05-10 MED ORDER — PREDNISONE 20 MG PO TABS: 40.0000 mg | ORAL_TABLET | Freq: Every day | ORAL | 0 refills | Status: DC

## 2018-05-10 NOTE — ED Provider Notes (Signed)
[facility] EMERGENCY DEPARTMENT Provider Note   CSN: [id number] Arrival date & time: 05/10/18  2021     History   Chief Complaint Chief Complaint  Patient presents with  . Knee Pain  . Numbness    HPI Nicole [name] is a 52 y.o. female.  Patient presents to the emergency department with a chief complaint of 2 complaints. 1.  Right knee pain: Patient reports chronic right knee pain.  She states that it feels like her right knee is grinding.  She has been evaluated by orthopedics and was given a cortisone shot in the distant past.  She states that she continues to have pain.  She states that she has been unable to follow-up with her primary care doctor regarding this.  She has tried taking OTC medications with little relief.  She states that her right knee feels unstable.  2.  Bilateral hand tingling: Patient reports that she has had hand tingling for the past month or so.  She denies any weakness.  She is concerned about carpal tunnel syndrome.  She denies any injuries to her hands.  Her symptoms are made worse with extension.  She has not taken anything for the symptoms.    The history is provided by the patient. No language interpreter was used.    Past Medical History:  Diagnosis Date  . Asthma   . Depression   . Hyperlipemia   . Hypertension   . Migraines   . Obesity   . Sciatica     Patient Active Problem List   Diagnosis Date Noted  . Acute respiratory failure with hypoxia (HCC) 01/23/2018  . Migraines 01/23/2018  . 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 Hypertension 01/23/2018  . Obesity 01/23/2018  . Hyperlipemia 01/23/2018  . Diabetes mellitus type 2 in obese (HCC) 01/23/2018  . OSA on CPAP 01/23/2018  . Acute hypokalemia 01/23/2018  . Left ventricular diastolic dysfunction 01/23/2018  . Microcytic anemia 01/23/2018  . Hypoxia 01/23/2018  . Ataxia due to recent  stroke 01/06/2018  . Gait disturbance, post-stroke 01/06/2018  . Leukocytosis   . Hypokalemia   . Vascular headache   . Hyponatremia   . Acute blood loss anemia   . Essential hypertension   . 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 Cytotoxic brain edema (HCC) 12/20/2017  . Hydrocephalus 12/20/2017  . 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 Encounter for central line placement   . Nausea vomiting and diarrhea     History reviewed. No pertinent surgical history.   OB History   None      Home Medications    Prior to Admission medications   Medication Sig Start Date End Date Taking? Authorizing Provider  acetaminophen (TYLENOL) 325 MG tablet Take 650 mg by mouth every 6 (six) hours as needed for mild pain.    [provider]  albuterol (VENTOLIN HFA) 108 (90 Base) MCG/ACT inhaler Inhale 2 puffs into the lungs every 4 (four) hours as needed for wheezing. 03/21/17   [provider]  aspirin EC 81 MG EC tablet Take 1 tablet (81 mg total) by mouth daily. 12/28/17   Angiulli, Mcarthur Rossetti, PA-C  atorvastatin (LIPITOR) 80 MG tablet Take 1 tablet (80 mg total) by mouth daily. 12/28/17   Angiulli, Mcarthur Rossetti, PA-C  butalbital-acetaminophen-caffeine (FIORICET, ESGIC) [phone] MG tablet Take 1 tablet by mouth every 6 (six) hours as needed for headache or  migraine. 12/28/17   Angiulli, Mcarthur Rossetti, PA-C  ferrous gluconate (FERGON) 324 MG tablet Take 1 tablet (324 mg total) by mouth 2 (two) times daily with a meal. 01/24/18   Calvert Cantor, MD  FLUoxetine (PROZAC) 20 MG tablet Take 2 tablets (40 mg total) by mouth 2 (two) times daily. 12/28/17   Angiulli, Mcarthur Rossetti, PA-C  lidocaine (LIDODERM) 5 % Place 1 patch onto the skin daily. Remove & Discard patch within 12 hours or as directed by MD 10/25/17   Anselm Pancoast, PA-C  meclizine (ANTIVERT) 12.5 MG tablet Take 1 tablet (12.5 mg total) by mouth 3 (three) times daily as needed for dizziness. 12/28/17    Angiulli, Mcarthur Rossetti, PA-C  metFORMIN (GLUCOPHAGE) 500 MG tablet Take 1 tablet (500 mg total) by mouth 2 (two) times daily with a meal. 12/28/17 12/28/18  Angiulli, Mcarthur Rossetti, PA-C  omeprazole (PRILOSEC) 20 MG capsule Take 20 mg by mouth at bedtime.    [provider]  ondansetron (ZOFRAN) 4 MG tablet Take 4 mg by mouth every 8 (eight) hours as needed for nausea or vomiting.    [provider]  traMADol (ULTRAM) 50 MG tablet Take 2 tablets (100 mg total) by mouth every 6 (six) hours as needed for moderate pain. 12/28/17   Angiulli, Mcarthur Rossetti, PA-C    Family History Family History  Problem Relation Age of Onset  . Hypertension Mother   . Hypertension Father     Social History Social History   Tobacco Use  . Smoking status: Never Smoker  . Smokeless tobacco: Never Used  Substance Use Topics  . Alcohol use: No  . Drug use: No     Allergies   Asa [aspirin]; Hydrocodone-acetaminophen; Ibuprofen; and Morphine and related   Review of Systems Review of Systems  All other systems reviewed and are negative.    Physical Exam Updated Vital Signs BP (!) 146/83 (BP Location: Right Arm)   Pulse 95   Temp 98.1 F (36.7 C) (Oral)   Resp 20   LMP 01/16/2018 (Within Days)   SpO2 93%   Physical Exam  Constitutional: She is oriented to person, place, and time. She appears well-developed and well-nourished.  HENT:  Head: Normocephalic and atraumatic.  Eyes: Pupils are equal, round, and reactive to light. Conjunctivae and EOM are normal.  Neck: Normal range of motion. Neck supple.  Cardiovascular: Normal rate and regular rhythm. Exam reveals no gallop and no friction rub.  No murmur heard. Pulmonary/Chest: Effort normal and breath sounds normal. No respiratory distress. She has no wheezes. She has no rales. She exhibits no tenderness.  Abdominal: Soft. Bowel sounds are normal. She exhibits no distension and no mass. There is no tenderness. There is no rebound and no  guarding.  Musculoskeletal: Normal range of motion. She exhibits no edema or tenderness.  Right knee without bony abnormality or deformity, range of motion and strength is 5/5  Bilateral hands and wrists are without bony abnormality or deformity, range of motion and strength is 5/5, positive Tinel, positive Phalen  Neurological: She is alert and oriented to person, place, and time.  Skin: Skin is warm and dry.  No evidence of rash, cellulitis, or zoster  Psychiatric: She has a normal mood and affect. Her behavior is normal. Judgment and thought content normal.  Nursing note and vitals reviewed.    ED Treatments / Results  Labs (all labs ordered are listed, but only abnormal results are displayed) Labs Reviewed  I-STAT CHEM 8,  ED - Abnormal; Notable for the following components:      Result Value   Chloride 96 (*)    BUN 21 (*)    Glucose, Bld 149 (*)    Hemoglobin 9.5 (*)    HCT 28.0 (*)    All other components within normal limits    EKG None  Radiology No results found.  Procedures Procedures (including critical care time)  Medications Ordered in ED Medications - No data to display   Initial Impression / Assessment and Plan / ED Course  I have reviewed the triage vital signs and the nursing notes.  Pertinent labs & imaging results that were available during my care of the patient were reviewed by me and considered in my medical decision making (see chart for details).    Patient with acute on chronic complaints of right knee pain and bilateral hand paresthesias.  I am suspicious of carpal tunnel syndrome.  Will recommend orthopedic follow-up.  Have recommended ice and rest.  We will give right knee immobilizer for right knee instability.  Recommend orthopedic follow-up for this as well.  Patient understands and agrees to plan.  She is not a diabetic, may benefit from a short course of steroid.  Final Clinical Impressions(s) / ED Diagnoses   Final diagnoses:    Paresthesia  Chronic pain of right knee    ED Discharge Orders    None       Felipa Furnace 05/10/18 2329    Rolland Porter, MD 05/12/18 0028

## 2018-05-10 NOTE — ED Notes (Signed)
Ortho paged for knee immobilzer

## 2018-05-10 NOTE — ED Triage Notes (Addendum)
Pt c/o R knee pain described as burning sensation and having bilateral hands and feet numbness for the past month. Denies any injuries. Pt has tried ibuprofen, tylenol, icy hot and bengay without improvement of pain

## 2018-05-11 NOTE — Progress Notes (Signed)
Orthopedic Tech Progress Note Patient Details:  Nicole [name] 01/09/1966 [id number]  Ortho Devices Type of Ortho Device: Knee Immobilizer Ortho Device/Splint Location: rle Ortho Device/Splint Interventions: Ordered, Application, Adjustment   Post Interventions Patient Tolerated: Well Instructions Provided: Care of device, Adjustment of device   Trinna Post 05/11/2018, 12:12 AM

## 2018-05-11 NOTE — ED Notes (Signed)
Patient verbalizes understanding of discharge instructions. Opportunity for questioning and answers were provided. Armband removed by staff, pt discharged from ED.  

## 2018-06-27 ENCOUNTER — Emergency Department (HOSPITAL_COMMUNITY)
Admission: EM | Admit: 2018-06-27 | Discharge: 2018-06-28 | Disposition: A | Discharge status: Home / Self Care | Attending: Emergency Medicine | Admitting: Emergency Medicine

## 2018-06-27 ENCOUNTER — Emergency Department (HOSPITAL_COMMUNITY)

## 2018-06-27 ENCOUNTER — Other Ambulatory Visit: Payer: Self-pay

## 2018-06-27 ENCOUNTER — Encounter (HOSPITAL_COMMUNITY): Payer: Self-pay | Admitting: Emergency Medicine

## 2018-06-27 DIAGNOSIS — R51 Headache: Secondary | ICD-10-CM | POA: Diagnosis not present

## 2018-06-27 DIAGNOSIS — Y9301 Activity, walking, marching and hiking: Secondary | ICD-10-CM | POA: Diagnosis not present

## 2018-06-27 DIAGNOSIS — R0602 Shortness of breath: Secondary | ICD-10-CM | POA: Diagnosis not present

## 2018-06-27 DIAGNOSIS — R0789 Other chest pain: Secondary | ICD-10-CM | POA: Insufficient documentation

## 2018-06-27 DIAGNOSIS — W010XXA Fall on same level from slipping, tripping and stumbling without subsequent striking against object, initial encounter: Secondary | ICD-10-CM | POA: Diagnosis not present

## 2018-06-27 DIAGNOSIS — H538 Other visual disturbances: Secondary | ICD-10-CM | POA: Insufficient documentation

## 2018-06-27 DIAGNOSIS — Y999 Unspecified external cause status: Secondary | ICD-10-CM | POA: Diagnosis not present

## 2018-06-27 DIAGNOSIS — Y929 Unspecified place or not applicable: Secondary | ICD-10-CM | POA: Diagnosis not present

## 2018-06-27 DIAGNOSIS — Z5321 Procedure and treatment not carried out due to patient leaving prior to being seen by health care provider: Secondary | ICD-10-CM | POA: Insufficient documentation

## 2018-06-27 HISTORY — DX: Cerebral infarction, unspecified: I63.9

## 2018-06-27 LAB — I-STAT CHEM 8, ED
BUN: 12 mg/dL (ref 6–20)
CREATININE: 0.6 mg/dL (ref 0.44–1.00)
Calcium, Ion: 1.12 mmol/L — ABNORMAL LOW (ref 1.15–1.40)
Chloride: 99 mmol/L (ref 98–111)
Glucose, Bld: 177 mg/dL — ABNORMAL HIGH (ref 70–99)
HEMATOCRIT: 28 % — AB (ref 36.0–46.0)
HEMOGLOBIN: 9.5 g/dL — AB (ref 12.0–15.0)
POTASSIUM: 3.1 mmol/L — AB (ref 3.5–5.1)
SODIUM: 139 mmol/L (ref 135–145)
TCO2: 25 mmol/L (ref 22–32)

## 2018-06-27 LAB — COMPREHENSIVE METABOLIC PANEL
ALBUMIN: 3.5 g/dL (ref 3.5–5.0)
ALT: 14 U/L (ref 0–44)
AST: 21 U/L (ref 15–41)
Alkaline Phosphatase: 78 U/L (ref 38–126)
Anion gap: 11 (ref 5–15)
BUN: 12 mg/dL (ref 6–20)
CHLORIDE: 100 mmol/L (ref 98–111)
CO2: 25 mmol/L (ref 22–32)
Calcium: 8.8 mg/dL — ABNORMAL LOW (ref 8.9–10.3)
Creatinine, Ser: 0.74 mg/dL (ref 0.44–1.00)
GFR calc Af Amer: >60 mL/min (ref >60)
GFR calc non Af Amer: >60 mL/min (ref >60)
GLUCOSE: 176 mg/dL — AB (ref 70–99)
POTASSIUM: 3.1 mmol/L — AB (ref 3.5–5.1)
Sodium: 136 mmol/L (ref 135–145)
Total Bilirubin: 0.5 mg/dL (ref 0.3–1.2)
Total Protein: 6.9 g/dL (ref 6.5–8.1)

## 2018-06-27 LAB — DIFFERENTIAL
Abs Immature Granulocytes: 0.1 10*3/uL (ref 0.0–0.1)
BASOS ABS: 0.1 10*3/uL (ref 0.0–0.1)
Basophils Relative: 1 %
EOS PCT: 1 %
Eosinophils Absolute: 0.1 10*3/uL (ref 0.0–0.7)
IMMATURE GRANULOCYTES: 1 %
LYMPHS ABS: 2.2 10*3/uL (ref 0.7–4.0)
Lymphocytes Relative: 18 %
Monocytes Absolute: 0.7 10*3/uL (ref 0.1–1.0)
Monocytes Relative: 5 %
NEUTROS PCT: 74 %
Neutro Abs: 9.3 10*3/uL — ABNORMAL HIGH (ref 1.7–7.7)

## 2018-06-27 LAB — I-STAT BETA HCG BLOOD, ED (MC, WL, AP ONLY): I-stat hCG, quantitative: <5 m[IU]/mL (ref <5)

## 2018-06-27 LAB — CBC
HCT: 29.4 % — ABNORMAL LOW (ref 36.0–46.0)
Hemoglobin: 8.3 g/dL — ABNORMAL LOW (ref 12.0–15.0)
MCH: 20.4 pg — ABNORMAL LOW (ref 26.0–34.0)
MCHC: 28.2 g/dL — ABNORMAL LOW (ref 30.0–36.0)
MCV: 72.4 fL — ABNORMAL LOW (ref 78.0–100.0)
PLATELETS: 346 10*3/uL (ref 150–400)
RBC: 4.06 MIL/uL (ref 3.87–5.11)
RDW: 16.4 % — AB (ref 11.5–15.5)
WBC: 12.4 10*3/uL — AB (ref 4.0–10.5)

## 2018-06-27 LAB — I-STAT TROPONIN, ED: Troponin i, poc: 0 ng/mL (ref 0.00–0.08)

## 2018-06-27 LAB — PROTIME-INR
INR: 1.02
PROTHROMBIN TIME: 13.3 s (ref 11.4–15.2)

## 2018-06-27 LAB — APTT: aPTT: 29 seconds (ref 24–36)

## 2018-06-27 NOTE — ED Triage Notes (Signed)
Pt initially reports she tripped and fell over her dog yesterday landing on her R side, reports R sided rib cage pain. Pt then states shes been having CP, SOB, HA, blurred vision X few weeks.

## 2018-06-28 NOTE — ED Notes (Signed)
Follow up call made  No answer  06/28/18/1158  s Kaushal Vannice rn

## 2018-06-28 NOTE — ED Notes (Signed)
Pt states that they are leaving due to wait times  

## 2018-07-27 ENCOUNTER — Emergency Department (HOSPITAL_COMMUNITY)

## 2018-07-27 ENCOUNTER — Inpatient Hospital Stay (HOSPITAL_COMMUNITY)
Admission: EM | Admit: 2018-07-27 | Discharge: 2018-07-31 | DRG: 348 | Disposition: A | Discharge status: Home / Self Care | Attending: Internal Medicine | Admitting: Internal Medicine

## 2018-07-27 ENCOUNTER — Encounter (HOSPITAL_COMMUNITY): Payer: Self-pay

## 2018-07-27 ENCOUNTER — Other Ambulatory Visit: Payer: Self-pay

## 2018-07-27 DIAGNOSIS — Z6841 Body Mass Index (BMI) 40.0 and over, adult: Secondary | ICD-10-CM | POA: Diagnosis not present

## 2018-07-27 DIAGNOSIS — R079 Chest pain, unspecified: Secondary | ICD-10-CM | POA: Diagnosis present

## 2018-07-27 DIAGNOSIS — K644 Residual hemorrhoidal skin tags: Secondary | ICD-10-CM | POA: Diagnosis present

## 2018-07-27 DIAGNOSIS — Z886 Allergy status to analgesic agent status: Secondary | ICD-10-CM | POA: Diagnosis not present

## 2018-07-27 DIAGNOSIS — E119 Type 2 diabetes mellitus without complications: Secondary | ICD-10-CM | POA: Diagnosis present

## 2018-07-27 DIAGNOSIS — Z885 Allergy status to narcotic agent status: Secondary | ICD-10-CM

## 2018-07-27 DIAGNOSIS — K921 Melena: Secondary | ICD-10-CM | POA: Diagnosis not present

## 2018-07-27 DIAGNOSIS — Z87891 Personal history of nicotine dependence: Secondary | ICD-10-CM | POA: Diagnosis not present

## 2018-07-27 DIAGNOSIS — Z8673 Personal history of transient ischemic attack (TIA), and cerebral infarction without residual deficits: Secondary | ICD-10-CM | POA: Diagnosis not present

## 2018-07-27 DIAGNOSIS — Z7982 Long term (current) use of aspirin: Secondary | ICD-10-CM | POA: Diagnosis not present

## 2018-07-27 DIAGNOSIS — E785 Hyperlipidemia, unspecified: Secondary | ICD-10-CM | POA: Diagnosis present

## 2018-07-27 DIAGNOSIS — Z79899 Other long term (current) drug therapy: Secondary | ICD-10-CM | POA: Diagnosis not present

## 2018-07-27 DIAGNOSIS — Z7984 Long term (current) use of oral hypoglycemic drugs: Secondary | ICD-10-CM | POA: Diagnosis not present

## 2018-07-27 DIAGNOSIS — F319 Bipolar disorder, unspecified: Secondary | ICD-10-CM | POA: Diagnosis present

## 2018-07-27 DIAGNOSIS — G43909 Migraine, unspecified, not intractable, without status migrainosus: Secondary | ICD-10-CM | POA: Diagnosis present

## 2018-07-27 DIAGNOSIS — Z23 Encounter for immunization: Secondary | ICD-10-CM | POA: Diagnosis not present

## 2018-07-27 DIAGNOSIS — I10 Essential (primary) hypertension: Secondary | ICD-10-CM | POA: Diagnosis present

## 2018-07-27 DIAGNOSIS — D62 Acute posthemorrhagic anemia: Secondary | ICD-10-CM | POA: Diagnosis present

## 2018-07-27 DIAGNOSIS — Z9989 Dependence on other enabling machines and devices: Secondary | ICD-10-CM

## 2018-07-27 DIAGNOSIS — D649 Anemia, unspecified: Secondary | ICD-10-CM | POA: Diagnosis present

## 2018-07-27 DIAGNOSIS — R0789 Other chest pain: Secondary | ICD-10-CM | POA: Diagnosis present

## 2018-07-27 DIAGNOSIS — E1169 Type 2 diabetes mellitus with other specified complication: Secondary | ICD-10-CM | POA: Diagnosis present

## 2018-07-27 DIAGNOSIS — E669 Obesity, unspecified: Secondary | ICD-10-CM | POA: Diagnosis present

## 2018-07-27 DIAGNOSIS — K922 Gastrointestinal hemorrhage, unspecified: Secondary | ICD-10-CM

## 2018-07-27 DIAGNOSIS — K648 Other hemorrhoids: Secondary | ICD-10-CM | POA: Diagnosis present

## 2018-07-27 DIAGNOSIS — K5900 Constipation, unspecified: Secondary | ICD-10-CM | POA: Diagnosis present

## 2018-07-27 DIAGNOSIS — J45909 Unspecified asthma, uncomplicated: Secondary | ICD-10-CM | POA: Diagnosis present

## 2018-07-27 DIAGNOSIS — G4733 Obstructive sleep apnea (adult) (pediatric): Secondary | ICD-10-CM | POA: Diagnosis present

## 2018-07-27 HISTORY — DX: Anemia, unspecified: D64.9

## 2018-07-27 HISTORY — DX: Low back pain: M54.5

## 2018-07-27 HISTORY — DX: Bipolar disorder, unspecified: F31.9

## 2018-07-27 HISTORY — DX: Obstructive sleep apnea (adult) (pediatric): G47.33

## 2018-07-27 HISTORY — DX: Dependence on other enabling machines and devices: Z99.89

## 2018-07-27 HISTORY — DX: Low back pain, unspecified: M54.50

## 2018-07-27 HISTORY — DX: Panic disorder (episodic paroxysmal anxiety): F41.0

## 2018-07-27 HISTORY — DX: Other chronic pain: G89.29

## 2018-07-27 HISTORY — DX: Personal history of other medical treatment: Z92.89

## 2018-07-27 HISTORY — DX: Type 2 diabetes mellitus without complications: E11.9

## 2018-07-27 LAB — BASIC METABOLIC PANEL
Anion gap: 7 (ref 5–15)
BUN: 18 mg/dL (ref 6–20)
CHLORIDE: 105 mmol/L (ref 98–111)
CO2: 26 mmol/L (ref 22–32)
CREATININE: 0.7 mg/dL (ref 0.44–1.00)
Calcium: 9 mg/dL (ref 8.9–10.3)
GFR calc Af Amer: >60 mL/min (ref >60)
GFR calc non Af Amer: >60 mL/min (ref >60)
Glucose, Bld: 109 mg/dL — ABNORMAL HIGH (ref 70–99)
Potassium: 4.1 mmol/L (ref 3.5–5.1)
SODIUM: 138 mmol/L (ref 135–145)

## 2018-07-27 LAB — GLUCOSE, CAPILLARY: GLUCOSE-CAPILLARY: 107 mg/dL — AB (ref 70–99)

## 2018-07-27 LAB — CBC
HCT: 23.1 % — ABNORMAL LOW (ref 36.0–46.0)
Hemoglobin: 6.4 g/dL — CL (ref 12.0–15.0)
MCH: 19.3 pg — ABNORMAL LOW (ref 26.0–34.0)
MCHC: 27.7 g/dL — ABNORMAL LOW (ref 30.0–36.0)
MCV: 69.8 fL — AB (ref 78.0–100.0)
PLATELETS: 469 10*3/uL — AB (ref 150–400)
RBC: 3.31 MIL/uL — AB (ref 3.87–5.11)
RDW: 16.6 % — ABNORMAL HIGH (ref 11.5–15.5)
WBC: 9.3 10*3/uL (ref 4.0–10.5)

## 2018-07-27 LAB — HEMOGLOBIN A1C
Hgb A1c MFr Bld: 5.8 % — ABNORMAL HIGH (ref 4.8–5.6)
Mean Plasma Glucose: 119.76 mg/dL

## 2018-07-27 LAB — I-STAT BETA HCG BLOOD, ED (MC, WL, AP ONLY)

## 2018-07-27 LAB — HEPATIC FUNCTION PANEL
ALK PHOS: 60 U/L (ref 38–126)
ALT: 14 U/L (ref 0–44)
AST: 15 U/L (ref 15–41)
Albumin: 3.4 g/dL — ABNORMAL LOW (ref 3.5–5.0)
BILIRUBIN TOTAL: 0.5 mg/dL (ref 0.3–1.2)
Bilirubin, Direct: <0.1 mg/dL (ref 0.0–0.2)
TOTAL PROTEIN: 6.9 g/dL (ref 6.5–8.1)

## 2018-07-27 LAB — PROTIME-INR
INR: 1.01
Prothrombin Time: 13.2 seconds (ref 11.4–15.2)

## 2018-07-27 LAB — I-STAT TROPONIN, ED: TROPONIN I, POC: 0.02 ng/mL (ref 0.00–0.08)

## 2018-07-27 LAB — POC OCCULT BLOOD, ED: FECAL OCCULT BLD: POSITIVE — AB

## 2018-07-27 LAB — PREPARE RBC (CROSSMATCH)

## 2018-07-27 MED ORDER — ACETAMINOPHEN 325 MG PO TABS
650.0000 mg | ORAL_TABLET | Freq: Four times a day (QID) | ORAL | Status: DC | PRN
  Filled 2018-07-27 (×2): qty 2

## 2018-07-27 MED ORDER — INFLUENZA VAC SPLIT QUAD 0.5 ML IM SUSY
0.5000 mL | PREFILLED_SYRINGE | INTRAMUSCULAR | Status: AC
  Administered 2018-07-29: 0.5 mL via INTRAMUSCULAR

## 2018-07-27 MED ORDER — ONDANSETRON HCL 4 MG/2ML IJ SOLN: 4.0000 mg | Freq: Four times a day (QID) | INTRAMUSCULAR | Status: DC | PRN

## 2018-07-27 MED ORDER — FLUOXETINE HCL 20 MG PO CAPS
40.0000 mg | ORAL_CAPSULE | Freq: Two times a day (BID) | ORAL | Status: DC
  Administered 2018-07-27 – 2018-07-31 (×8): 40 mg via ORAL
  Filled 2018-07-27 (×8): qty 2

## 2018-07-27 MED ORDER — INSULIN ASPART 100 UNIT/ML ~~LOC~~ SOLN
0.0000 [IU] | Freq: Three times a day (TID) | SUBCUTANEOUS | Status: DC
  Administered 2018-07-29: 2 [IU] via SUBCUTANEOUS

## 2018-07-27 MED ORDER — PANTOPRAZOLE SODIUM 40 MG PO TBEC
40.0000 mg | DELAYED_RELEASE_TABLET | Freq: Every day | ORAL | Status: DC
  Administered 2018-07-27 – 2018-07-31 (×5): 40 mg via ORAL
  Filled 2018-07-27 (×5): qty 1

## 2018-07-27 MED ORDER — ATORVASTATIN CALCIUM 80 MG PO TABS
80.0000 mg | ORAL_TABLET | Freq: Every day | ORAL | Status: DC
  Administered 2018-07-27 – 2018-07-31 (×5): 80 mg via ORAL
  Filled 2018-07-27 (×5): qty 1

## 2018-07-27 MED ORDER — ONDANSETRON HCL 4 MG PO TABS: 4.0000 mg | ORAL_TABLET | Freq: Four times a day (QID) | ORAL | Status: DC | PRN

## 2018-07-27 MED ORDER — DILTIAZEM HCL ER COATED BEADS 120 MG PO CP24
240.0000 mg | ORAL_CAPSULE | Freq: Every day | ORAL | Status: DC
  Administered 2018-07-27 – 2018-07-31 (×4): 240 mg via ORAL
  Filled 2018-07-27 (×3): qty 2
  Filled 2018-07-27 (×2): qty 1

## 2018-07-27 MED ORDER — MORPHINE SULFATE (PF) 2 MG/ML IV SOLN
2.0000 mg | INTRAVENOUS | Status: DC | PRN
  Filled 2018-07-27 (×2): qty 1

## 2018-07-27 MED ORDER — ACETAMINOPHEN 650 MG RE SUPP: 650.0000 mg | Freq: Four times a day (QID) | RECTAL | Status: DC | PRN

## 2018-07-27 MED ORDER — INSULIN ASPART 100 UNIT/ML ~~LOC~~ SOLN: 0.0000 [IU] | Freq: Every day | SUBCUTANEOUS | Status: DC

## 2018-07-27 MED ORDER — METOPROLOL SUCCINATE ER 25 MG PO TB24
25.0000 mg | ORAL_TABLET | Freq: Every day | ORAL | Status: DC
  Administered 2018-07-28 – 2018-07-31 (×3): 25 mg via ORAL
  Filled 2018-07-27 (×3): qty 1

## 2018-07-27 MED ORDER — LACTATED RINGERS IV SOLN
INTRAVENOUS | Status: DC
  Administered 2018-07-27 – 2018-07-30 (×10): via INTRAVENOUS

## 2018-07-27 MED ORDER — ALBUTEROL SULFATE (2.5 MG/3ML) 0.083% IN NEBU: 2.5000 mg | INHALATION_SOLUTION | RESPIRATORY_TRACT | Status: DC | PRN

## 2018-07-27 MED ORDER — SODIUM CHLORIDE 0.9% IV SOLUTION
Freq: Once | INTRAVENOUS | Status: AC
  Administered 2018-07-27: 14:00:00 via INTRAVENOUS

## 2018-07-27 MED ORDER — LIDOCAINE 5 % EX PTCH
1.0000 | MEDICATED_PATCH | CUTANEOUS | Status: DC
  Administered 2018-07-27 – 2018-07-28 (×2): 1 via TRANSDERMAL
  Filled 2018-07-27 (×3): qty 1

## 2018-07-27 NOTE — ED Notes (Signed)
Placed pt on 2lpm oxygen. Pt O2 drops with sleeping/snoring. Pt has cpap but rarely uses it.

## 2018-07-27 NOTE — ED Notes (Signed)
Returned to ED after giving bedside report on 61M

## 2018-07-27 NOTE — H&P (Signed)
History and Physical    Nicole [name] ZOX:[id number] DOB: [birth year] DOA: 07/27/2018  PCP: Verl Bangs, MD Consultants:  gastroenterologist Dr. Yevonne Pax [phone]; cell [phone]); pulmonary - Jessica Patient coming from:  Home - lives with fiance; NOK: Fiance, [phone]  Chief Complaint: Chest pain  HPI: Nicole [name] is a 52 y.o. female with medical history significant of CVA (left PICA CVA in 2/19); obesity; HTN; HLD; OSA; DM; and depression presenting with chest pain.  "I'm broken."  She has been having rectal bleeding for a month.  She has not been seen for this.  The bleeding is sometimes bright, other times it is dark and has clots.  Free flowing blood when she urinates.  She also wears a pad because she leaks.  The bleeding happens every time she sits on the toilet.  She has internal hemorrhoids but has never had GI bleeding before.  She started with abdominal pain 2-3 days ago.  She had a screening colonoscopy a couple of months ago, maybe January.  She went back and they were planning to do a hemorrhoidectomy because she was having a bit of bleeding.  The bleeding has worsened and fills the toilet recently.  Over the last week, she has increasingly weak and tired.  She has SOB the last week with exertion but has progressed so that she is mildly SOB at rest.  +weak, dizzy, falling down and passing out.  She has pain underneath both of her breasts over the last week.  Despite report of no prior rectal bleeding, PA Joy talked with an RN from her GI office and wrote the following note:  Spoke with Alben Deeds, RN, Print production planner for Dr. Yevonne Pax' office. Reviewed the patient's history with me.  Has had a history of repeated bright red rectal bleeding. Has history of noncompliance and skipping appointments.  Colonoscopy in March 2018 performed for rectal bleeding. Single polyp in the descending colon with internal hemorrhoids noted.   ED Course:   GI bleed, Hgb  6.4.  Symptomatic anemia.  Bleeding for the last month, followed by GI in [place].  +BRBPR, + hemorrhoids on exam without frank blood.  He has been trying to get in touch with GI since she has had a fairly recent colonoscopy, but likely to need scope again.  He has started a transfusion of 2 units PRBC.  Review of Systems: As per HPI; otherwise review of systems reviewed and negative.   Ambulatory Status:  Ambulates without assistance  Past Medical History:  Diagnosis Date  . Asthma   . Back pain    scheduled to see pain management  . Depression   . Hyperlipemia   . Hypertension   . Migraines   . Obesity   . OSA on CPAP    inconsistent use  . Sciatica   . Stroke [facility]) 12/2017   PICA distribution    Past Surgical History:  Procedure Laterality Date  . CESAREAN SECTION    . ENDOMETRIAL ABLATION     occasional vaginal bleeding  . EYE SURGERY      Social History   Socioeconomic History  . Marital status: Married    Spouse name: Not on file  . Number of children: Not on file  . Years of education: Not on file  . Highest education level: Not on file  Occupational History  . Occupation: stay at home   Social Needs  . Financial resource strain: Not on file  . Food insecurity:    Worry: Not on  file    Inability: Not on file  . Transportation needs:    Medical: Not on file    Non-medical: Not on file  Tobacco Use  . Smoking status: Never Smoker  . Smokeless tobacco: Never Used  Substance and Sexual Activity  . Alcohol use: No  . Drug use: No  . Sexual activity: Yes    Birth control/protection: Surgical  Lifestyle  . Physical activity:    Days per week: Not on file    Minutes per session: Not on file  . Stress: Not on file  Relationships  . Social connections:    Talks on phone: Not on file    Gets together: Not on file    Attends religious service: Not on file    Active member of club or organization: Not on file    Attends meetings of clubs or  organizations: Not on file    Relationship status: Not on file  . Intimate partner violence:    Fear of current or ex partner: Not on file    Emotionally abused: Not on file    Physically abused: Not on file    Forced sexual activity: Not on file  Other Topics Concern  . Not on file  Social History Narrative  . Not on file    Allergies  Allergen Reactions  . Asa [Aspirin] Other (See Comments)    Contraindication per provider - pt states not allergic, PCP stated did not go with treatment regimen, ok for pt to take medication  . Hydrocodone-Acetaminophen Other (See Comments)    headache  . Ibuprofen Nausea Only  . Morphine And Related Palpitations    Family History  Problem Relation Age of Onset  . Hypertension Mother   . Hypertension Father     Prior to Admission medications   Medication Sig Start Date End Date Taking? Authorizing Provider  acetaminophen (TYLENOL) 325 MG tablet Take 650 mg by mouth every 6 (six) hours as needed for mild pain.   Yes [provider]  albuterol (VENTOLIN HFA) 108 (90 Base) MCG/ACT inhaler Inhale 2 puffs into the lungs every 4 (four) hours as needed for wheezing. 03/21/17  Yes [provider]  aspirin EC 81 MG EC tablet Take 1 tablet (81 mg total) by mouth daily. Patient taking differently: Take 81 mg by mouth at bedtime.  12/28/17  Yes Angiulli, Mcarthur Rossetti, PA-C  atorvastatin (LIPITOR) 80 MG tablet Take 1 tablet (80 mg total) by mouth daily. 12/28/17  Yes Angiulli, Mcarthur Rossetti, PA-C  butalbital-acetaminophen-caffeine (FIORICET, ESGIC) 50-325-40 MG tablet Take 1 tablet by mouth every 6 (six) hours as needed for headache or migraine. 12/28/17  Yes Angiulli, Mcarthur Rossetti, PA-C  diltiazem (CARDIZEM CD) 240 MG 24 hr capsule Take 240 mg by mouth daily. 07/15/18  Yes [provider]  FLUoxetine (PROZAC) 20 MG tablet Take 2 tablets (40 mg total) by mouth 2 (two) times daily. 12/28/17  Yes Angiulli, Mcarthur Rossetti, PA-C  furosemide (LASIX) 20 MG tablet  Take 20 mg by mouth 2 (two) times daily. 06/24/18  Yes [provider]  hydrochlorothiazide (HYDRODIURIL) 25 MG tablet Take 25 mg by mouth daily. 06/24/18  Yes [provider]  lidocaine (LIDODERM) 5 % Place 1 patch onto the skin daily. Remove & Discard patch within 12 hours or as directed by MD 10/25/17  Yes Joy, Shawn C, PA-C  meclizine (ANTIVERT) 12.5 MG tablet Take 1 tablet (12.5 mg total) by mouth 3 (three) times daily as needed for dizziness.  12/28/17  Yes Angiulli, Mcarthur Rossetti, PA-C  metFORMIN (GLUCOPHAGE) 500 MG tablet Take 1 tablet (500 mg total) by mouth 2 (two) times daily with a meal. 12/28/17 12/28/18 Yes Angiulli, Mcarthur Rossetti, PA-C  metoprolol succinate (TOPROL-XL) 25 MG 24 hr tablet Take 25 mg by mouth daily. 06/24/18  Yes [provider]  naproxen sodium (ALEVE) 220 MG tablet Take 220 mg by mouth daily as needed.   Yes [provider]  omeprazole (PRILOSEC) 20 MG capsule Take 20 mg by mouth at bedtime.   Yes [provider]  ondansetron (ZOFRAN) 4 MG tablet Take 4 mg by mouth every 8 (eight) hours as needed for nausea or vomiting.   Yes [provider]  OVER THE COUNTER MEDICATION CPAP   Yes [provider]  potassium chloride (K-DUR) 10 MEQ tablet Take 10 mEq by mouth daily. 06/01/18  Yes [provider]  SUMAtriptan (IMITREX) 100 MG tablet Take 100 mg by mouth as needed. 06/01/18  Yes [provider]  traMADol (ULTRAM) 50 MG tablet Take 2 tablets (100 mg total) by mouth every 6 (six) hours as needed for moderate pain. 12/28/17  Yes Angiulli, Mcarthur Rossetti, PA-C  ferrous gluconate (FERGON) 324 MG tablet Take 1 tablet (324 mg total) by mouth 2 (two) times daily with a meal. Patient not taking: Reported on 07/27/2018 01/24/18   Calvert Cantor, MD  predniSONE (DELTASONE) 20 MG tablet Take 2 tablets (40 mg total) by mouth daily. Patient not taking: Reported on 07/27/2018 05/10/18   [name], PA-C    Physical  Exam: Vitals:   07/27/18 1515 07/27/18 1530 07/27/18 1545 07/27/18 1600  BP: 117/69 108/62 101/65 116/71  Pulse: 81 79 78 83  Resp: 18 19 16 12   Temp:      TempSrc:      SpO2: 100% 100% 98% 100%  Weight:      Height:         General:  Appears calm and comfortable and is NAD Eyes:  PERRL, EOMI, normal lids, iris; mild conjunctival pallor ENT:  grossly normal hearing, lips & tongue, mmm Neck:  no LAD, masses or thyromegaly; no carotid bruits Cardiovascular:  RRR, no m/r/g. No LE edema.  Respiratory:   CTA bilaterally with no wheezes/rales/rhonchi.  Normal respiratory effort. Abdomen:  soft,  Diffusely TTP,  ND, NABS Skin:  no rash or induration seen on limited exam Musculoskeletal:  grossly normal tone BUE/BLE, good ROM, no bony abnormality Lower extremity:  No LE edema.  2+ distal pulses. Psychiatric:  grossly normal mood and affect, speech fluent and appropriate, AOx3 Neurologic:  CN 2-12 grossly intact, moves all extremities in coordinated fashion, sensation intact    Radiological Exams on Admission: Dg Chest 2 View  Result Date: 07/27/2018 CLINICAL DATA:  Mid chest pain, shortness of breath EXAM: CHEST - 2 VIEW COMPARISON:  06/27/2018 FINDINGS: Heart and mediastinal contours are within normal limits. No focal opacities or effusions. No acute bony abnormality. IMPRESSION: No active cardiopulmonary disease. Electronically Signed   By: Charlett Nose M.D.   On: 07/27/2018 11:09    EKG: Independently reviewed.  NSR with rate 90; incomplete RBBB (chronic); no evidence of acute ischemia   Labs on Admission: I have personally reviewed the available labs and imaging studies at the time of the admission.  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 -Transfuse 2 units PRBC   Lower GI bleeding -Most likely source appears to be bleeding hemorrhoids (given patient's history of hemorrhoids, this seems likely; it does seem to be a large amount of blood from hemorrhoids, however) -AVM is a consideration, but this was not reported on her prior c-scope within the last 6 months -Diverticular bleeding is unlikely given the chronicity of it -Inpatient admission -Transfusing now, with repeat H/H following transfusion tonight and repeat CBC in the AM -Continue to monitor for recurrent bleeding  -Dr. Alona Bene from [facility] has been consulted and will see the patient tomorrow AM to discuss repeat C-scope as an inpatient vs. Outpatient f/u. -Clear liquids for now. -Morphine as needed for pain.  HTN -Continue Toprol XL and Cardizem  HLD -Continue high-dose Lipitor  DM -Will check A1c -hold Glucophage -Cover with moderate-scale SSI  H/o CVA -Appears to be doing well from this standpoint -Will hold ASA due to active bleeding   OSA -Inconsistent use of CPAP -Will not order at this time  DVT prophylaxis:  SCDs Code Status:  Full - confirmed with patient/family Family Communication: Fiance present throughout evaluation  Disposition Plan:  Home once clinically improved Consults called: GI  Admission status: Admit - It is my clinical opinion that admission to INPATIENT is  reasonable and necessary because of the expectation that this patient will require hospital care that crosses at least 2 midnights to treat this condition based on the medical complexity of the problems presented.  Given the aforementioned information, the predictability of an adverse outcome is felt to be significant.     Jonah Blue MD [facility]  If note is complete, please contact covering daytime or nighttime physician. [email or web address] Password [facility]  07/27/2018, 4:27 PM

## 2018-07-27 NOTE — ED Notes (Signed)
IV team arrived. 

## 2018-07-27 NOTE — ED Triage Notes (Signed)
Pt endorses intermittent chest pain x 3 days, a migraine that began this morning, and a bleeding hemorrhoid x 1 month. VSS.

## 2018-07-27 NOTE — ED Provider Notes (Signed)
[facility] EMERGENCY DEPARTMENT Provider Note   CSN: [id number] Arrival date & time: 07/27/18  1019     History   Chief Complaint Chief Complaint  Patient presents with  . Chest Pain  . Migraine    HPI Nicole [name] is a 52 y.o. female.  HPI  Nicole [name] is a 52 y.o. female, with a history of asthma, HTN, hemorrhoids, and obesity, presenting to the ED with chest pain for about the last week.  Pain is bilateral, under the breasts, aching, nonradiating.  Accompanied by shortness of breath, fatigue, weakness, nausea, and vomiting with occasional hematemesis, all for the last week. Patient endorses bright red rectal bleeding throughout the day for at least the last month, worsening over the last week. She states she has had bright red rectal bleeding in the past, but nothing so severe and nothing that has made her feel this way. Denies syncope, fever, urinary symptoms, or any other complaints.  She is followed by gastroenterologist Dr. Yevonne Pax [phone]), [facility] in [place].   Past Medical History:  Diagnosis Date  . Asthma   . Depression   . Hyperlipemia   . Hypertension   . Migraines   . Obesity   . Sciatica   . Stroke [facility]) 12/2017    Patient Active Problem List   Diagnosis Date Noted  . Symptomatic anemia 07/27/2018  . Acute respiratory failure with hypoxia (HCC) 01/23/2018  . Migraines 01/23/2018  . 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 Hypertension 01/23/2018  . Obesity 01/23/2018  . Hyperlipemia 01/23/2018  . Diabetes mellitus type 2 in obese (HCC) 01/23/2018  . OSA on CPAP 01/23/2018  . Acute hypokalemia 01/23/2018  . Left ventricular diastolic dysfunction 01/23/2018  . Microcytic anemia 01/23/2018  . Hypoxia 01/23/2018  . 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 Gait disturbance, post-stroke 01/06/2018  . Leukocytosis     . Hypokalemia   . Vascular headache   . Hyponatremia   . Acute blood loss anemia   . Essential hypertension   . 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 Cytotoxic brain edema (HCC) 12/20/2017  . Hydrocephalus 12/20/2017  . 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 Encounter for central line placement   . Nausea vomiting and diarrhea     History reviewed. No pertinent surgical history.   OB History   None      Home Medications    Prior to Admission medications   Medication Sig Start Date End Date Taking? Authorizing Provider  acetaminophen (TYLENOL) 325 MG tablet Take 650 mg by mouth every 6 (six) hours as needed for mild pain.   Yes [provider]  albuterol (VENTOLIN HFA) 108 (90 Base) MCG/ACT inhaler Inhale 2 puffs into the lungs every 4 (four) hours as needed for wheezing. 03/21/17  Yes [provider]  aspirin EC 81 MG EC tablet Take 1 tablet (81 mg total) by mouth daily. Patient taking differently: Take 81 mg by mouth at bedtime.  12/28/17  Yes Angiulli, Mcarthur Rossetti, PA-C  atorvastatin (LIPITOR) 80 MG tablet Take 1 tablet (80 mg total) by mouth daily. 12/28/17  Yes Angiulli, Mcarthur Rossetti, PA-C  butalbital-acetaminophen-caffeine (FIORICET, ESGIC) 50-325-40 MG tablet Take 1 tablet by mouth every 6 (six) hours as needed for headache or migraine. 12/28/17  Yes Angiulli, Mcarthur Rossetti, PA-C  diltiazem (CARDIZEM CD) 240 MG 24 hr capsule Take 240 mg by mouth daily. 07/15/18  Yes [provider]  FLUoxetine (PROZAC) 20 MG tablet Take 2 tablets (40 mg total) by mouth 2 (two) times daily. 12/28/17  Yes Angiulli, Mcarthur Rossetti, PA-C  furosemide (LASIX) 20 MG tablet Take 20 mg by mouth 2 (two) times daily. 06/24/18  Yes [provider]  hydrochlorothiazide (HYDRODIURIL) 25 MG tablet Take 25 mg by mouth daily. 06/24/18  Yes [provider]  lidocaine (LIDODERM) 5 % Place 1 patch onto the skin daily. Remove & Discard patch within 12  hours or as directed by MD 10/25/17  Yes Jodie Cavey C, PA-C  meclizine (ANTIVERT) 12.5 MG tablet Take 1 tablet (12.5 mg total) by mouth 3 (three) times daily as needed for dizziness. 12/28/17  Yes Angiulli, Mcarthur Rossetti, PA-C  metFORMIN (GLUCOPHAGE) 500 MG tablet Take 1 tablet (500 mg total) by mouth 2 (two) times daily with a meal. 12/28/17 12/28/18 Yes Angiulli, Mcarthur Rossetti, PA-C  metoprolol succinate (TOPROL-XL) 25 MG 24 hr tablet Take 25 mg by mouth daily. 06/24/18  Yes [provider]  naproxen sodium (ALEVE) 220 MG tablet Take 220 mg by mouth daily as needed.   Yes [provider]  omeprazole (PRILOSEC) 20 MG capsule Take 20 mg by mouth at bedtime.   Yes [provider]  ondansetron (ZOFRAN) 4 MG tablet Take 4 mg by mouth every 8 (eight) hours as needed for nausea or vomiting.   Yes [provider]  OVER THE COUNTER MEDICATION CPAP   Yes [provider]  potassium chloride (K-DUR) 10 MEQ tablet Take 10 mEq by mouth daily. 06/01/18  Yes [provider]  SUMAtriptan (IMITREX) 100 MG tablet Take 100 mg by mouth as needed. 06/01/18  Yes [provider]  traMADol (ULTRAM) 50 MG tablet Take 2 tablets (100 mg total) by mouth every 6 (six) hours as needed for moderate pain. 12/28/17  Yes Angiulli, Mcarthur Rossetti, PA-C  ferrous gluconate (FERGON) 324 MG tablet Take 1 tablet (324 mg total) by mouth 2 (two) times daily with a meal. Patient not taking: Reported on 07/27/2018 01/24/18   Calvert Cantor, MD  predniSONE (DELTASONE) 20 MG tablet Take 2 tablets (40 mg total) by mouth daily. Patient not taking: Reported on 07/27/2018 05/10/18   [name], PA-C    Family History Family History  Problem Relation Age of Onset  . Hypertension Mother   . Hypertension Father     Social History Social History   Tobacco Use  . Smoking status: Never Smoker  . Smokeless tobacco: Never Used  Substance Use Topics  . Alcohol use: No  . Drug use: No      Allergies   Asa [aspirin]; Hydrocodone-acetaminophen; Ibuprofen; and Morphine and related   Review of Systems Review of Systems  Constitutional: Positive for chills and fatigue. Negative for fever.  Respiratory: Positive for shortness of breath. Negative for cough.   Cardiovascular: Positive for chest pain. Negative for leg swelling.  Gastrointestinal: Positive for abdominal pain, blood in stool, diarrhea, nausea and vomiting.  Genitourinary: Negative for dysuria, flank pain and hematuria.  Neurological: Positive for weakness (generalized) and light-headedness.  All other systems reviewed and are negative.    Physical Exam Updated Vital Signs BP 121/76 (BP Location: Right Arm)   Pulse 87   Temp 98.6 F (37 C) (Oral)   Resp 16   Ht 5\' 3"  (1.6 m)   Wt 120.2 kg   LMP 07/13/2018 (Approximate)   SpO2 96%   BMI 46.94 kg/m   Physical Exam  Constitutional: She appears well-developed and well-nourished. No distress.  HENT:  Head: Normocephalic and atraumatic.  Mouth/Throat: Mucous membranes are pale.  Eyes: Conjunctivae are normal.  Neck: Neck supple.  Cardiovascular: Normal rate, regular rhythm, normal heart sounds and intact distal pulses.  Pulmonary/Chest: Effort normal and breath sounds normal. No respiratory distress.  Abdominal: Soft. There is generalized tenderness. There is no guarding.  Genitourinary: Rectal exam shows external hemorrhoid and internal hemorrhoid.  Genitourinary Comments: Internal and external hemorrhoids noted.  Some rectal tenderness.  Some bright red blood, but no free-flowing hemorrhage noted. No foreign bodies noted.  RN, Lanora Manis, served as chaperone during the rectal exam.  Musculoskeletal: She exhibits no edema.  Lymphadenopathy:    She has no cervical adenopathy.  Neurological: She is alert.  Skin: Skin is warm and dry. She is not diaphoretic.  Psychiatric: She has a normal mood and affect. Her behavior is normal.  Nursing note and  vitals reviewed.    ED Treatments / Results  Labs (all labs ordered are listed, but only abnormal results are displayed) Labs Reviewed  BASIC METABOLIC PANEL - Abnormal; Notable for the following components:      Result Value   Glucose, Bld 109 (*)    All other components within normal limits  CBC - Abnormal; Notable for the following components:   RBC 3.31 (*)    Hemoglobin 6.4 (*)    HCT 23.1 (*)    MCV 69.8 (*)    MCH 19.3 (*)    MCHC 27.7 (*)    RDW 16.6 (*)    Platelets 469 (*)    All other components within normal limits  HEPATIC FUNCTION PANEL - Abnormal; Notable for the following components:   Albumin 3.4 (*)    All other components within normal limits  POC OCCULT BLOOD, ED - Abnormal; Notable for the following components:   Fecal Occult Bld POSITIVE (*)    All other components within normal limits  PROTIME-INR  I-STAT TROPONIN, ED  I-STAT BETA HCG BLOOD, ED ([facility], [facility], [facility] ONLY)  TYPE AND SCREEN  PREPARE RBC (CROSSMATCH)    EKG EKG Interpretation  Date/Time:  Thursday July 27 2018 10:31:43 EDT Ventricular Rate:  90 PR Interval:  198 QRS Duration: 96 QT Interval:  364 QTC Calculation: 445 R Axis:   39 Text Interpretation:  Normal sinus rhythm Incomplete right bundle branch block Borderline ECG No significant change since last tracing Confirmed by Shaune Pollack [id number]) on 07/27/2018 3:00:08 PM   Radiology Dg Chest 2 View  Result Date: 07/27/2018 CLINICAL DATA:  Mid chest pain, shortness of breath EXAM: CHEST - 2 VIEW COMPARISON:  06/27/2018 FINDINGS: Heart and mediastinal contours are within normal limits. No focal opacities or effusions. No acute bony abnormality. IMPRESSION: No active cardiopulmonary disease. Electronically Signed   By: Charlett Nose M.D.   On: 07/27/2018 11:09    Procedures .Critical Care Performed by: Anselm Pancoast, PA-C Authorized by: Anselm Pancoast, PA-C   Critical care provider statement:    Critical care time (minutes):  35    Critical care time was exclusive of:  Separately billable procedures and treating other patients   Critical care was necessary to treat or prevent imminent or life-threatening deterioration of the following conditions: Symptomatic anemia.   Critical care was time spent personally by me on the following activities:  Development of treatment plan with patient or surrogate, discussions with consultants, examination of patient, evaluation of patient's response to treatment, obtaining history from patient  or surrogate, pulse oximetry, ordering and review of laboratory studies, ordering and performing treatments and interventions, re-evaluation of patient's condition and review of old charts   I assumed direction of critical care for this patient from another provider in my specialty: no     (including critical care time)  Medications Ordered in ED Medications  0.9 %  sodium chloride infusion (Manually program via Guardrails IV Fluids) ( Intravenous New Bag/Given 07/27/18 1343)     Initial Impression / Assessment and Plan / ED Course  I have reviewed the triage vital signs and the nursing notes.  Pertinent labs & imaging results that were available during my care of the patient were reviewed by me and considered in my medical decision making (see chart for details).  Clinical Course as of Jul 27 1528  Thu Jul 27, 2018  1450 Spoke with Dr. Ophelia Charter, hospitalist. Agrees to admit the patient.   [SJ]  1516 Spoke with Alben Deeds, RN, Print production planner for Dr. Yevonne Pax' office. Reviewed the patient's history with me. Has had a history of repeated bright red rectal bleeding. Has history of noncompliance and skipping appointments.  Colonoscopy in March 2018 performed for rectal bleeding. Single polyp in the descending colon with internal hemorrhoids noted.    [SJ]    Clinical Course User Index [SJ] Florance Paolillo C, PA-C    Patient presents with symptomatic anemia in the setting of GI bleed.  She has  pallor on exam, however, she is afebrile, not tachycardic, and not hypotensive. She had an initial hemoglobin of 6.4 and required transfusion.  Admitted via the hospitalist service.  Findings and plan of care discussed with Shaune Pollack, MD. Dr. Erma Heritage personally evaluated and examined this patient.  Vitals:   07/27/18 1433 07/27/18 1445 07/27/18 1500 07/27/18 1501  BP:  124/68 125/70   Pulse:  78 78   Resp:  16 16   Temp: 98 F (36.7 C)     TempSrc: Oral     SpO2:  100% (!) 87% 100%  Weight:      Height:         Final Clinical Impressions(s) / ED Diagnoses   Final diagnoses:  Symptomatic anemia  Gastrointestinal hemorrhage, unspecified gastrointestinal hemorrhage type    ED Discharge Orders    None       Concepcion Living 07/27/18 1529    Shaune Pollack, MD 07/29/18 [phone]

## 2018-07-27 NOTE — ED Notes (Signed)
Report attempted 

## 2018-07-28 ENCOUNTER — Encounter (HOSPITAL_COMMUNITY)
Admission: EM | Disposition: A | Discharge status: Home / Self Care | Payer: Self-pay | Source: Home / Self Care | Attending: Internal Medicine

## 2018-07-28 DIAGNOSIS — D62 Acute posthemorrhagic anemia: Secondary | ICD-10-CM

## 2018-07-28 DIAGNOSIS — K921 Melena: Secondary | ICD-10-CM

## 2018-07-28 DIAGNOSIS — E1169 Type 2 diabetes mellitus with other specified complication: Secondary | ICD-10-CM

## 2018-07-28 DIAGNOSIS — I10 Essential (primary) hypertension: Secondary | ICD-10-CM

## 2018-07-28 DIAGNOSIS — K922 Gastrointestinal hemorrhage, unspecified: Secondary | ICD-10-CM

## 2018-07-28 DIAGNOSIS — E669 Obesity, unspecified: Secondary | ICD-10-CM

## 2018-07-28 DIAGNOSIS — G4733 Obstructive sleep apnea (adult) (pediatric): Secondary | ICD-10-CM

## 2018-07-28 DIAGNOSIS — Z9989 Dependence on other enabling machines and devices: Secondary | ICD-10-CM

## 2018-07-28 DIAGNOSIS — Z8673 Personal history of transient ischemic attack (TIA), and cerebral infarction without residual deficits: Secondary | ICD-10-CM

## 2018-07-28 HISTORY — PX: FLEXIBLE SIGMOIDOSCOPY: SHX5431

## 2018-07-28 LAB — BASIC METABOLIC PANEL
Anion gap: 9 (ref 5–15)
BUN: 10 mg/dL (ref 6–20)
CHLORIDE: 105 mmol/L (ref 98–111)
CO2: 24 mmol/L (ref 22–32)
CREATININE: 0.58 mg/dL (ref 0.44–1.00)
Calcium: 8.7 mg/dL — ABNORMAL LOW (ref 8.9–10.3)
GFR calc non Af Amer: >60 mL/min (ref >60)
GLUCOSE: 98 mg/dL (ref 70–99)
Potassium: 4.1 mmol/L (ref 3.5–5.1)
Sodium: 138 mmol/L (ref 135–145)

## 2018-07-28 LAB — CBC
HCT: 25.7 % — ABNORMAL LOW (ref 36.0–46.0)
Hemoglobin: 7.5 g/dL — ABNORMAL LOW (ref 12.0–15.0)
MCH: 20.9 pg — AB (ref 26.0–34.0)
MCHC: 29.2 g/dL — AB (ref 30.0–36.0)
MCV: 71.6 fL — AB (ref 78.0–100.0)
PLATELETS: 377 10*3/uL (ref 150–400)
RBC: 3.59 MIL/uL — ABNORMAL LOW (ref 3.87–5.11)
RDW: 18.5 % — AB (ref 11.5–15.5)
WBC: 7.9 10*3/uL (ref 4.0–10.5)

## 2018-07-28 LAB — GLUCOSE, CAPILLARY
GLUCOSE-CAPILLARY: 85 mg/dL (ref 70–99)
GLUCOSE-CAPILLARY: 110 mg/dL — AB (ref 70–99)
Glucose-Capillary: 102 mg/dL — ABNORMAL HIGH (ref 70–99)
Glucose-Capillary: 110 mg/dL — ABNORMAL HIGH (ref 70–99)
Glucose-Capillary: 130 mg/dL — ABNORMAL HIGH (ref 70–99)

## 2018-07-28 SURGERY — SIGMOIDOSCOPY, FLEXIBLE

## 2018-07-28 MED ORDER — POLYETHYLENE GLYCOL 3350 17 G PO PACK
17.0000 g | PACK | Freq: Every day | ORAL | Status: DC
  Administered 2018-07-28 – 2018-07-30 (×3): 17 g via ORAL
  Filled 2018-07-28 (×3): qty 1

## 2018-07-28 MED ORDER — ACETAMINOPHEN 325 MG PO TABS
650.0000 mg | ORAL_TABLET | Freq: Four times a day (QID) | ORAL | Status: DC | PRN
  Administered 2018-07-28: 650 mg via ORAL

## 2018-07-28 MED ORDER — TRAMADOL HCL 50 MG PO TABS
50.0000 mg | ORAL_TABLET | Freq: Once | ORAL | Status: AC
  Administered 2018-07-28: 50 mg via ORAL
  Filled 2018-07-28: qty 1

## 2018-07-28 MED ORDER — FLEET ENEMA 7-19 GM/118ML RE ENEM
1.0000 | ENEMA | Freq: Once | RECTAL | Status: AC
  Administered 2018-07-28: 1 via RECTAL
  Filled 2018-07-28 (×2): qty 1

## 2018-07-28 MED ORDER — PREPARATION H TOTABLES WIPES 50 % EX PADS
1.0000 "application " | MEDICATED_PAD | Freq: Three times a day (TID) | CUTANEOUS | Status: DC
  Administered 2018-07-28: 1 via CUTANEOUS
  Filled 2018-07-28 (×2): qty 1

## 2018-07-28 MED ORDER — HYDROCORTISONE ACETATE 25 MG RE SUPP
25.0000 mg | Freq: Two times a day (BID) | RECTAL | Status: DC
  Administered 2018-07-28: 25 mg via RECTAL
  Filled 2018-07-28 (×3): qty 1

## 2018-07-28 MED ORDER — DOCUSATE SODIUM 100 MG PO CAPS
100.0000 mg | ORAL_CAPSULE | Freq: Two times a day (BID) | ORAL | Status: DC
  Administered 2018-07-28 – 2018-07-31 (×5): 100 mg via ORAL
  Filled 2018-07-28 (×4): qty 1

## 2018-07-28 MED ORDER — WITCH HAZEL-GLYCERIN EX PADS
MEDICATED_PAD | Freq: Three times a day (TID) | CUTANEOUS | Status: DC
  Filled 2018-07-28: qty 100

## 2018-07-28 MED ORDER — FLEET ENEMA 7-19 GM/118ML RE ENEM
1.0000 | ENEMA | Freq: Once | RECTAL | Status: AC
  Administered 2018-07-28: 1 via RECTAL
  Filled 2018-07-28: qty 1

## 2018-07-28 NOTE — Consult Note (Signed)
Referring Provider:  TH Primary Care Physician:  Verl Bangs, MD Primary Gastroenterologist:  [facility]  Reason for Consultation:  Rectal bleeding  HPI: Nicole [name] is a 52 y.o. female with past medical history of CVA, hypertension, diabetes, obstructive sleep apnea presented to the hospital with chest pain and one-month history of rectal bleeding.she was found to have a anemia with hemoglobin of 6.4. GI is consulted for further evaluation.  Patient seen and examined at bedside. She is complaining of intermittent rectal bleeding for last 1 month , described it as bright red blood per rectum as well Maroon colored blood clots.she denied associated rectal pain. She has been having generalized lower abd pain.  She denied black tarry stool.Denies significant NSAID use.   Records from care everywhere review She has been having recurrent rectal bleeding since last 3 years.  She initially underwent colonoscopy in 2016 in [place] which was normal. [facility] which also showed no evidence of active bleeding and showed internal hemorrhoids.  Past Medical History:  Diagnosis Date  . Anemia   . Anxiety attack   . Asthma   . Bipolar disorder (HCC)   . Chronic lower back pain    scheduled to see pain management (07/27/2018)  . Depression   . History of blood transfusion 12/2017; 07/27/2018  . Hyperlipemia   . Hypertension   . Migraines    "5-6/year now" (07/27/2018)  . Obesity   . OSA on CPAP    inconsistent use (07/27/2018)  . Panic attacks   . Sciatica   . Stroke [facility]) 12/2017   PICA distribution; "fully recovered" (07/27/2018)  . Type II diabetes mellitus (HCC)     Past Surgical History:  Procedure Laterality Date  . CESAREAN SECTION  1988  . ENDOMETRIAL ABLATION  2017   occasional vaginal bleeding  . EYE SURGERY Left 1987   "cyst on eyelid removed"  . TUBAL LIGATION  1988    Prior to Admission  medications   Medication Sig Start Date End Date Taking? Authorizing Provider  acetaminophen (TYLENOL) 325 MG tablet Take 650 mg by mouth every 6 (six) hours as needed for mild pain.   Yes [provider]  albuterol (VENTOLIN HFA) 108 (90 Base) MCG/ACT inhaler Inhale 2 puffs into the lungs every 4 (four) hours as needed for wheezing. 03/21/17  Yes [provider]  aspirin EC 81 MG EC tablet Take 1 tablet (81 mg total) by mouth daily. Patient taking differently: Take 81 mg by mouth at bedtime.  12/28/17  Yes Angiulli, Mcarthur Rossetti, PA-C  atorvastatin (LIPITOR) 80 MG tablet Take 1 tablet (80 mg total) by mouth daily. 12/28/17  Yes Angiulli, Mcarthur Rossetti, PA-C  butalbital-acetaminophen-caffeine (FIORICET, ESGIC) 50-325-40 MG tablet Take 1 tablet by mouth every 6 (six) hours as needed for headache or migraine. 12/28/17  Yes Angiulli, Mcarthur Rossetti, PA-C  diltiazem (CARDIZEM CD) 240 MG 24 hr capsule Take 240 mg by mouth daily. 07/15/18  Yes [provider]  FLUoxetine (PROZAC) 20 MG tablet Take 2 tablets (40 mg total) by mouth 2 (two) times daily. 12/28/17  Yes Angiulli, Mcarthur Rossetti, PA-C  furosemide (LASIX) 20 MG tablet Take 20 mg by mouth 2 (two) times daily. 06/24/18  Yes [provider]  hydrochlorothiazide (HYDRODIURIL) 25 MG tablet Take 25 mg by mouth daily. 06/24/18  Yes [provider]  lidocaine (LIDODERM) 5 % Place 1 patch onto the skin daily. Remove & Discard patch within 12  hours or as directed by MD 10/25/17  Yes Joy, Shawn C, PA-C  meclizine (ANTIVERT) 12.5 MG tablet Take 1 tablet (12.5 mg total) by mouth 3 (three) times daily as needed for dizziness. 12/28/17  Yes Angiulli, Mcarthur Rossetti, PA-C  metFORMIN (GLUCOPHAGE) 500 MG tablet Take 1 tablet (500 mg total) by mouth 2 (two) times daily with a meal. 12/28/17 12/28/18 Yes Angiulli, Mcarthur Rossetti, PA-C  metoprolol succinate (TOPROL-XL) 25 MG 24 hr tablet Take 25 mg by mouth daily. 06/24/18  Yes [provider]  naproxen sodium  (ALEVE) 220 MG tablet Take 220 mg by mouth daily as needed.   Yes [provider]  omeprazole (PRILOSEC) 20 MG capsule Take 20 mg by mouth at bedtime.   Yes [provider]  ondansetron (ZOFRAN) 4 MG tablet Take 4 mg by mouth every 8 (eight) hours as needed for nausea or vomiting.   Yes [provider]  OVER THE COUNTER MEDICATION CPAP   Yes [provider]  potassium chloride (K-DUR) 10 MEQ tablet Take 10 mEq by mouth daily. 06/01/18  Yes [provider]  SUMAtriptan (IMITREX) 100 MG tablet Take 100 mg by mouth as needed. 06/01/18  Yes [provider]  traMADol (ULTRAM) 50 MG tablet Take 2 tablets (100 mg total) by mouth every 6 (six) hours as needed for moderate pain. 12/28/17  Yes Angiulli, Mcarthur Rossetti, PA-C    Scheduled Meds: . atorvastatin  80 mg Oral Daily  . diltiazem  240 mg Oral Daily  . FLUoxetine  40 mg Oral BID  . Influenza vac split quadrivalent PF  0.5 mL Intramuscular Tomorrow-1000  . insulin aspart  0-15 Units Subcutaneous TID WC  . insulin aspart  0-5 Units Subcutaneous QHS  . lidocaine  1 patch Transdermal Q24H  . metoprolol succinate  25 mg Oral Daily  . pantoprazole  40 mg Oral Daily   Continuous Infusions: . lactated ringers 100 mL/hr at 07/28/18 0039   PRN Meds:.acetaminophen **OR** acetaminophen, acetaminophen, albuterol, morphine injection, ondansetron **OR** ondansetron (ZOFRAN) IV  Allergies as of 07/27/2018 - Review Complete 07/27/2018  Allergen Reaction Noted  . Asa [aspirin] Other (See Comments) 04/01/2015  . Hydrocodone-acetaminophen Other (See Comments) 10/16/2006  . Ibuprofen Nausea Only 02/06/2015  . Morphine and related Palpitations 04/01/2015    Family History  Problem Relation Age of Onset  . Hypertension Mother   . Hypertension Father     Social History   Socioeconomic History  . Marital status: Married    Spouse name: Not on file  . Number of children: Not on file  . Years of education:  Not on file  . Highest education level: Not on file  Occupational History  . Occupation: stay at home   Social Needs  . Financial resource strain: Not on file  . Food insecurity:    Worry: Not on file    Inability: Not on file  . Transportation needs:    Medical: Not on file    Non-medical: Not on file  Tobacco Use  . Smoking status: Former Games developer  . Smokeless tobacco: Never Used  . Tobacco comment: 07/27/2018 "stopped in 2011"  Substance and Sexual Activity  . Alcohol use: Not Currently  . Drug use: Never  . Sexual activity: Not Currently    Birth control/protection: Surgical  Lifestyle  . Physical activity:    Days per week: Not on file    Minutes per session: Not on file  . Stress: Not on file  Relationships  .  Social connections:    Talks on phone: Not on file    Gets together: Not on file    Attends religious service: Not on file    Active member of club or organization: Not on file    Attends meetings of clubs or organizations: Not on file    Relationship status: Not on file  . Intimate partner violence:    Fear of current or ex partner: Not on file    Emotionally abused: Not on file    Physically abused: Not on file    Forced sexual activity: Not on file  Other Topics Concern  . Not on file  Social History Narrative  . Not on file    Review of Systems: Review of Systems  Constitutional: Negative for chills and fever.  HENT: Negative for hearing loss and tinnitus.   Eyes: Negative for blurred vision and double vision.  Respiratory: Negative for cough and hemoptysis.   Cardiovascular: Negative for chest pain and palpitations.  Gastrointestinal: Positive for abdominal pain and blood in stool. Negative for heartburn, melena, nausea and vomiting.  Genitourinary: Negative for dysuria and urgency.  Musculoskeletal: Negative for myalgias and neck pain.  Skin: Negative for itching and rash.  Neurological: Negative for seizures and loss of consciousness.   Endo/Heme/Allergies: Bruises/bleeds easily.  Psychiatric/Behavioral: Negative for suicidal ideas. The patient does not have insomnia.     Physical Exam: Vital signs: Vitals:   07/28/18 0502 07/28/18 0730  BP: 137/77 112/64  Pulse: 77 76  Resp: 18 18  Temp: 98 F (36.7 C) (!) 97.5 F (36.4 C)  SpO2: 100% 98%   Last BM Date: 07/27/18 Physical Exam  Constitutional: She is oriented to person, place, and time. She appears well-developed and well-nourished.  HENT:  Head: Normocephalic and atraumatic.  Mouth/Throat: No oropharyngeal exudate.  Eyes: EOM are normal. No scleral icterus.  Neck: Normal range of motion. Neck supple.  Cardiovascular: Normal rate, regular rhythm and normal heart sounds.  Pulmonary/Chest: Effort normal and breath sounds normal. No respiratory distress.  Abdominal: Soft. Bowel sounds are normal. She exhibits no distension. There is no tenderness. There is no rebound and no guarding.  Musculoskeletal: Normal range of motion. She exhibits no edema.  Neurological: She is alert and oriented to person, place, and time.  Skin: Skin is warm. No erythema.  Psychiatric: She has a normal mood and affect. Judgment normal.  Vitals reviewed.  GI:  Lab Results: Recent Labs    07/27/18 1045 07/27/18 2353  WBC 9.3 7.9  HGB 6.4* 7.5*  HCT 23.1* 25.7*  PLT 469* 377   BMET Recent Labs    07/27/18 1045 07/27/18 2353  NA 138 138  K 4.1 4.1  CL 105 105  CO2 26 24  GLUCOSE 109* 98  BUN 18 10  CREATININE 0.70 0.58  CALCIUM 9.0 8.7*   LFT Recent Labs    07/27/18 1248  PROT 6.9  ALBUMIN 3.4*  AST 15  ALT 14  ALKPHOS 60  BILITOT 0.5  BILIDIR <0.1  IBILI NOT CALCULATED   PT/INR Recent Labs    07/27/18 1248  LABPROT 13.2  INR 1.01     Studies/Results: Dg Chest 2 View  Result Date: 07/27/2018 CLINICAL DATA:  Mid chest pain, shortness of breath EXAM: CHEST - 2 VIEW COMPARISON:  06/27/2018 FINDINGS: Heart and mediastinal contours are within normal  limits. No focal opacities or effusions. No acute bony abnormality. IMPRESSION: No active cardiopulmonary disease. Electronically Signed   By: Caryn Bee  [place] M.D.   On: 07/27/2018 11:09    Impression/Plan: - recurrent rectal bleeding.most likely hemorrhoidal bleeding. - Acute blood loss anemia. Hgb down to 6.4, improved after blood transfusion.she had normal hemoglobin in Jan but Hgb was 9.1 on 05/30/2018  - Morbid obesity  Recommendations ------------------------ - Unsedated flexible sigmoidoscopy .  - Monitor H&H. Transfuse to keep hemoglobin around 7-8.  Risks (bleeding, infection, bowel perforation that could require surgery, sedation-related changes in cardiopulmonary systems), benefits (identification and possible treatment of source of symptoms, exclusion of certain causes of symptoms), and alternatives (watchful waiting, radiographic imaging studies, empiric medical treatment)  were explained to patient and family in detail and patient wishes to proceed.    LOS: 1 day   Kathi Der  MD, FACP 07/28/2018, 9:00 AM  Contact #  [phone]

## 2018-07-28 NOTE — Progress Notes (Signed)
PROGRESS NOTE    Nicole [name]  ZOX:[id number] DOB: 1966/05/29 DOA: 07/27/2018 PCP: Verl Bangs, MD   Brief Narrative:  Nicole [name] is a 52 y.o. female with medical history significant of CVA (left PICA CVA in 2/19); obesity; HTN; HLD; OSA; DM; and depression presenting with chest pain.  She has been having rectal bleeding for a month. She had a screening colonoscopy in march 2018 was found to have hemorrhoids. She was found to be anemic on admission, underwent 2 units of prbc transfusion and GI consulted. she underwent flexible sigmoidoscopy was found to have internal and external hemorrhoids. Surgery consulted for further recommendations and possible hemorrhoidectomy, .  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 Atypical.  No dizziness this am. Troponin negative.  Sob improved.    Symptomatic anemia/ acute anemia of blood loss From rectal bleeding from hemorrhoids,.  S/p 2 units of prbc transfusion.  Repeat H&H is stable around 7.5.  She underwent flexible sig showing both external and internal hemorrhoids.  Surgery consulted for possible hemorrhoidectomy.   Hypertension:  Well controlled.    H/o CVA;  Not on aspirin because of rectal bleed.  On lipitor.    DM:  CBG (last 3)  Recent Labs    07/27/18 1713 07/27/18 2209 07/28/18 0732  GLUCAP 107* 85 102*   A1c is 5. 8.    OSA on CPAP:      DVT prophylaxis: scd's. Code Status: full code.  Family Communication: family at bedside.  Disposition Plan: pending further work up by surgery.    Consultants:   Gastroenterology  Surgery.    Procedures: Flexible sigmoidoscopy.     Antimicrobials: none.    Subjective: Denies any chest pain or sob, nausea, vomiting or abdominal pain.  PT denies any hematochezia today.   Objective: Vitals:   07/28/18 1155 07/28/18 1200 07/28/18 1205 07/28/18 1210  BP: (!) 107/55 (!) 103/53 117/64 (!) 112/51  Pulse: 91 74 85 79  Resp: (!) 23 11 13 14   Temp:  98 F (36.7 C)    TempSrc:  Oral    SpO2: 100% 97% 100% 93%  Weight:      Height:        Intake/Output Summary (Last 24 hours) at 07/28/2018 1251 Last data filed at 07/28/2018 0926 Gross per 24 hour  Intake 1826.19 ml  Output 0 ml  Net 1826.19 ml   Filed Weights   07/27/18 1032 07/28/18 1150  Weight: 120.2 kg 120.2 kg    Examination:  General exam: Appears calm and comfortable  Respiratory system: Clear to auscultation. Respiratory effort normal. Cardiovascular system: S1 & S2 heard, RRR. No JVD, murmurs Gastrointestinal system: Abdomen is nondistended, soft and nontender. No organomegaly or masses felt. Normal bowel sounds heard. Central nervous system: Alert and oriented. No focal neurological deficits. Extremities: Symmetric 5 x 5 power. Skin: No rashes, lesions or ulcers Psychiatry: . Mood & affect appropriate.     Data Reviewed: I have personally reviewed following labs and imaging studies  CBC: Recent Labs  Lab 07/27/18 1045 07/27/18 2353  WBC 9.3 7.9  HGB 6.4* 7.5*  HCT 23.1* 25.7*  MCV 69.8* 71.6*  PLT 469* 377  Basic Metabolic Panel: Recent Labs  Lab 07/27/18 1045 07/27/18 2353  NA 138 138  K 4.1 4.1  CL 105 105  CO2 26 24  GLUCOSE 109* 98  BUN 18 10  CREATININE 0.70 0.58  CALCIUM 9.0 8.7*   GFR: Estimated Creatinine Clearance: 105.6 mL/min (by C-G formula based on SCr of 0.58 mg/dL). Liver Function Tests: Recent Labs  Lab 07/27/18 1248  AST 15  ALT 14  ALKPHOS 60  BILITOT 0.5  PROT 6.9  ALBUMIN 3.4*   No results for input(s): LIPASE, AMYLASE in the last 168 hours. No results for input(s): AMMONIA in the last  168 hours. Coagulation Profile: Recent Labs  Lab 07/27/18 1248  INR 1.01   Cardiac Enzymes: No results for input(s): CKTOTAL, CKMB, CKMBINDEX, TROPONINI in the last 168 hours. BNP (last 3 results) No results for input(s): PROBNP in the last 8760 hours. HbA1C: Recent Labs    07/27/18 1632  HGBA1C 5.8*   CBG: Recent Labs  Lab 07/27/18 1713 07/27/18 2209 07/28/18 0732  GLUCAP 107* 85 102*   Lipid Profile: No results for input(s): CHOL, HDL, LDLCALC, TRIG, CHOLHDL, LDLDIRECT in the last 72 hours. Thyroid Function Tests: No results for input(s): TSH, T4TOTAL, FREET4, T3FREE, THYROIDAB in the last 72 hours. Anemia Panel: No results for input(s): VITAMINB12, FOLATE, FERRITIN, TIBC, IRON, RETICCTPCT in the last 72 hours. Sepsis Labs: No results for input(s): PROCALCITON, LATICACIDVEN in the last 168 hours.  No results found for this or any previous visit (from the past 240 hour(s)).       Radiology Studies: Dg Chest 2 View  Result Date: 07/27/2018 CLINICAL DATA:  Mid chest pain, shortness of breath EXAM: CHEST - 2 VIEW COMPARISON:  06/27/2018 FINDINGS: Heart and mediastinal contours are within normal limits. No focal opacities or effusions. No acute bony abnormality. IMPRESSION: No active cardiopulmonary disease. Electronically Signed   By: Charlett Nose M.D.   On: 07/27/2018 11:09        Scheduled Meds: . atorvastatin  80 mg Oral Daily  . diltiazem  240 mg Oral Daily  . FLUoxetine  40 mg Oral BID  . hydrocortisone  25 mg Rectal BID  . Influenza vac split quadrivalent PF  0.5 mL Intramuscular Tomorrow-1000  . insulin aspart  0-15 Units Subcutaneous TID WC  . insulin aspart  0-5 Units Subcutaneous QHS  . lidocaine  1 patch Transdermal Q24H  . metoprolol succinate  25 mg Oral Daily  . pantoprazole  40 mg Oral Daily   Continuous Infusions: . lactated ringers 100 mL/hr at 07/28/18 1235     LOS: 1 day    Time spent: 35 minutes.     Kathlen Mody, MD [facility] Pager [phone]   If 7PM-7AM, please contact night-coverage [email or web address] Password [facility] 07/28/2018, 12:51 PM

## 2018-07-28 NOTE — Brief Op Note (Signed)
07/27/2018 - 07/28/2018  12:08 PM  PATIENT:  Nicole [name]  52 y.o. female  PRE-OPERATIVE DIAGNOSIS:  Rectal bleeding  POST-OPERATIVE DIAGNOSIS:  external hemorrhoids   PROCEDURE:  Procedure(s): FLEXIBLE SIGMOIDOSCOPY (N/A)  SURGEON:  Surgeon(s) and Role:    * Boone Gear, MD - Primary  Findings ------------ - Flexible sigmoidoscopy showed external and internal hemorrhoids with oozing of blood. No evidence of bleeding inside the colon. No diverticulosis.   Recommendations -------------------------- - Surgery consultation for further evaluation  - start Anusol HC. - Start soft diet. - Monitor H&H. - Follow-up with primary GI doctor after discharge [facility] - GI will sign off. Call us back if needed  Kathi Der MD, FACP 07/28/2018, 12:11 PM  Contact #  [phone]

## 2018-07-28 NOTE — Consult Note (Signed)
[facility] Surgery Consult Note  Nicole [name] 1965/11/26  [id number].    Requesting MD: Alessandra Bevels, MD Chief Complaint/Reason for Consult: lower GI bleed due to hemorrhoids   HPI:  Ms. Nicole [name] is a 52 y/o F with a PMH CVA, obesity, HTN, HLD, OSA, DM and depression who presented to [facility] 9/12 with a cc chest pain and one month of rectal bleeding. States that over the last month she has blacked out and fallen 4 times. She has had daily bright red blood per rectum. Some days the bleeding is heavier than others. On arrival in the ED patient was found to have a hemoglobin of 6.4 requiring 2 unit PRBCs. GI was consulted and performed a flexible sigmoidoscopy today revealing external and internal hemorrhoids with oozing of blood; no evidence of bleeding inside the colon. Due to significant acute blood loss anemia and length of time bleeding from hemorrhoids, general surgery was consulted.  Prior to admission, patient reports seeing a GI physician in [place] (Dr. Hipolito [name]) for management of hemorrhoids, including rectal bleeding, and trying steroid suppositories for one week with no decrease in bleeding. She admits to having intermittent constipation for which she takes a stool softener PRN for. Typically has a BM every 1-2 days.   ROS: Review of Systems  Constitutional: Positive for malaise/fatigue.  HENT: Negative.  Negative for nosebleeds.   Eyes: Negative.   Respiratory: Negative.   Cardiovascular: Negative.   Gastrointestinal: Positive for blood in stool. Negative for abdominal pain, nausea and vomiting.  Genitourinary: Negative.   Musculoskeletal: Negative.   Skin: Negative.   Neurological: Positive for loss of consciousness and weakness.  Psychiatric/Behavioral: Negative.     Family History  Problem Relation Age of Onset  . Hypertension Mother   . Hypertension Father     Past Medical History:  Diagnosis Date  . Anemia   . Anxiety attack   . Asthma   . Bipolar  disorder ([place])   . Chronic lower back pain    scheduled to see pain management (07/27/2018)  . Depression   . History of blood transfusion 12/2017; 07/27/2018  . Hyperlipemia   . Hypertension   . Migraines    "5-6/year now" (07/27/2018)  . Obesity   . OSA on CPAP    inconsistent use (07/27/2018)  . Panic attacks   . Sciatica   . Stroke [facility]) 12/2017   PICA distribution; "fully recovered" (07/27/2018)  . Type II diabetes mellitus ([place])     Past Surgical History:  Procedure Laterality Date  . CESAREAN SECTION  1988  . ENDOMETRIAL ABLATION  2017   occasional vaginal bleeding  . EYE SURGERY Left 1987   "cyst on eyelid removed"  . TUBAL LIGATION  1988    Social History:  reports that she has quit smoking. She has never used smokeless tobacco. She reports that she drank alcohol. She reports that she does not use drugs.  Allergies:  Allergies  Allergen Reactions  . Asa [Aspirin] Other (See Comments)    Contraindication per provider - pt states not allergic, PCP stated did not go with treatment regimen, ok for pt to take medication  . Hydrocodone-Acetaminophen Other (See Comments)    headache  . Ibuprofen Nausea Only  . Morphine And Related Palpitations    Medications Prior to Admission  Medication Sig Dispense Refill  . acetaminophen (TYLENOL) 325 MG tablet Take 650 mg by mouth every 6 (six) hours as needed for mild pain.    Marland Kitchen albuterol (VENTOLIN HFA) 108 (90  Base) MCG/ACT inhaler Inhale 2 puffs into the lungs every 4 (four) hours as needed for wheezing.    Marland Kitchen aspirin EC 81 MG EC tablet Take 1 tablet (81 mg total) by mouth daily. (Patient taking differently: Take 81 mg by mouth at bedtime. )    . atorvastatin (LIPITOR) 80 MG tablet Take 1 tablet (80 mg total) by mouth daily. 30 tablet 1  . butalbital-acetaminophen-caffeine (FIORICET, ESGIC) 50-325-40 MG tablet Take 1 tablet by mouth every 6 (six) hours as needed for headache or migraine. 20 tablet 0  . diltiazem (CARDIZEM CD) 240  MG 24 hr capsule Take 240 mg by mouth daily.  0  . FLUoxetine (PROZAC) 20 MG tablet Take 2 tablets (40 mg total) by mouth 2 (two) times daily. 60 tablet 3  . furosemide (LASIX) 20 MG tablet Take 20 mg by mouth 2 (two) times daily.  0  . hydrochlorothiazide (HYDRODIURIL) 25 MG tablet Take 25 mg by mouth daily.  1  . lidocaine (LIDODERM) 5 % Place 1 patch onto the skin daily. Remove & Discard patch within 12 hours or as directed by MD 30 patch 0  . meclizine (ANTIVERT) 12.5 MG tablet Take 1 tablet (12.5 mg total) by mouth 3 (three) times daily as needed for dizziness. 60 tablet 0  . metFORMIN (GLUCOPHAGE) 500 MG tablet Take 1 tablet (500 mg total) by mouth 2 (two) times daily with a meal. 60 tablet 11  . metoprolol succinate (TOPROL-XL) 25 MG 24 hr tablet Take 25 mg by mouth daily.  1  . naproxen sodium (ALEVE) 220 MG tablet Take 220 mg by mouth daily as needed.    Marland Kitchen omeprazole (PRILOSEC) 20 MG capsule Take 20 mg by mouth at bedtime.    . ondansetron (ZOFRAN) 4 MG tablet Take 4 mg by mouth every 8 (eight) hours as needed for nausea or vomiting.    Marland Kitchen OVER THE COUNTER MEDICATION CPAP    . potassium chloride (K-DUR) 10 MEQ tablet Take 10 mEq by mouth daily.  0  . SUMAtriptan (IMITREX) 100 MG tablet Take 100 mg by mouth as needed.  12  . traMADol (ULTRAM) 50 MG tablet Take 2 tablets (100 mg total) by mouth every 6 (six) hours as needed for moderate pain. 30 tablet 0    Blood pressure (!) 112/51, pulse 79, temperature 98 F (36.7 C), temperature source Oral, resp. rate 14, height _0  (1.6 m), weight 120.2 kg, last menstrual period 07/13/2018, SpO2 93 %. Physical Exam: Physical Exam  Constitutional: She is oriented to person, place, and time. She appears well-developed and well-nourished.  Non-toxic appearance. She does not appear ill. No distress.  HENT:  Head: Normocephalic and atraumatic.  Eyes: Pupils are equal, round, and reactive to light. EOM are normal.  Neck: Normal range of motion. Neck  supple. No tracheal deviation present.  Cardiovascular: Regular rhythm and intact distal pulses.  Pulses:      Dorsalis pedis pulses are 2+ on the right side, and 2+ on the left side.  Pulmonary/Chest: Effort normal and breath sounds normal. No accessory muscle usage or stridor. No tachypnea. No respiratory distress. She has no wheezes. She has no rhonchi. She has no rales.  Abdominal: Soft. Bowel sounds are normal. She exhibits no distension. There is no splenomegaly or hepatomegaly. There is no tenderness. There is no rebound and no guarding.  Genitourinary:  Genitourinary Comments: External hemorrhoids noted with bright red blood around the anus, no pulsatile/active bleeding noted  Musculoskeletal:  Right lower leg: She exhibits no tenderness and no edema.       Left lower leg: She exhibits no tenderness and no edema.  Neurological: She is alert and oriented to person, place, and time. She is not disoriented. No cranial nerve deficit.  Skin: Skin is warm and dry. Capillary refill takes less than 2 seconds. There is pallor.  Psychiatric: She has a normal mood and affect. Her behavior is normal.  Nursing note and vitals reviewed.   Results for orders placed or performed during the hospital encounter of 07/27/18 (from the past 48 hour(s))  Basic metabolic panel     Status: Abnormal   Collection Time: 07/27/18 10:45 AM  Result Value Ref Range   Sodium 138 135 - 145 mmol/L   Potassium 4.1 3.5 - 5.1 mmol/L   Chloride 105 98 - 111 mmol/L   CO2 26 22 - 32 mmol/L   Glucose, Bld 109 (H) 70 - 99 mg/dL   BUN 18 6 - 20 mg/dL   Creatinine, Ser 0.70 0.44 - 1.00 mg/dL   Calcium 9.0 8.9 - 10.3 mg/dL   GFR calc non Af Amer >60 >60 mL/min   GFR calc Af Amer >60 >60 mL/min    Comment: (NOTE) The eGFR has been calculated using the CKD EPI equation. This calculation has not been validated in all clinical situations. eGFR's persistently <60 mL/min signify possible Chronic Kidney Disease.     Anion gap 7 5 - 15    Comment: Performed at [facility] [address] [place], [place] [address]  CBC     Status: Abnormal   Collection Time: 07/27/18 10:45 AM  Result Value Ref Range   WBC 9.3 4.0 - 10.5 K/uL   RBC 3.31 (L) 3.87 - 5.11 MIL/uL   Hemoglobin 6.4 (LL) 12.0 - 15.0 g/dL    Comment: REPEATED TO VERIFY CRITICAL RESULT CALLED TO, READ BACK BY AND VERIFIED WITH: BERDIK,L RN @ 1117 07/27/18 LEONARD,A    HCT 23.1 (L) 36.0 - 46.0 %   MCV 69.8 (L) 78.0 - 100.0 fL   MCH 19.3 (L) 26.0 - 34.0 pg   MCHC 27.7 (L) 30.0 - 36.0 g/dL   RDW 16.6 (H) 11.5 - 15.5 %   Platelets 469 (H) 150 - 400 K/uL    Comment: Performed at [facility], [place] [address], [place], [place] [address]  I-stat troponin, ED     Status: None   Collection Time: 07/27/18 10:51 AM  Result Value Ref Range   Troponin i, poc 0.02 0.00 - 0.08 ng/mL   Comment 3            Comment: Due to the release kinetics of cTnI, a negative result within the first hours of the onset of symptoms does not rule out myocardial infarction with certainty. If myocardial infarction is still suspected, repeat the test at appropriate intervals.   I-Stat beta hCG blood, ED     Status: None   Collection Time: 07/27/18 10:52 AM  Result Value Ref Range   I-stat hCG, quantitative <5.0 <5 mIU/mL   Comment 3            Comment:   GEST. AGE      CONC.  (mIU/mL)   <=1 WEEK        5 - 50     2 WEEKS       50 - 500     3 WEEKS  100 - 10,000     4 WEEKS     1,000 - 30,000        FEMALE AND NON-PREGNANT FEMALE:     LESS THAN 5 mIU/mL   Protime-INR     Status: None   Collection Time: 07/27/18 12:48 PM  Result Value Ref Range   Prothrombin Time 13.2 11.4 - 15.2 seconds   INR 1.01     Comment: Performed at [facility], [place] [address] [place], [place] [address]  Hepatic function panel     Status: Abnormal   Collection Time: 07/27/18 12:48 PM  Result Value Ref Range   Total Protein 6.9 6.5 - 8.1 g/dL   Albumin 3.4  (L) 3.5 - 5.0 g/dL   AST 15 15 - 41 U/L   ALT 14 0 - 44 U/L   Alkaline Phosphatase 60 38 - 126 U/L   Total Bilirubin 0.5 0.3 - 1.2 mg/dL   Bilirubin, Direct <0.1 0.0 - 0.2 mg/dL   Indirect Bilirubin NOT CALCULATED 0.3 - 0.9 mg/dL    Comment: Performed at [facility] [address] [place], [place] [address]  Type and screen [place]     Status: None   Collection Time: 07/27/18 12:50 PM  Result Value Ref Range   ABO/RH(D) A POS    Antibody Screen NEG    Sample Expiration 07/30/2018    Unit Number [id number]    Blood Component Type RED CELLS,LR    Unit division 00    Status of Unit ISSUED,FINAL    Transfusion Status OK TO TRANSFUSE    Crossmatch Result      Compatible Performed at [facility], [place] [address], [place], [place] [address]    Unit Number [id number]    Blood Component Type RED CELLS,LR    Unit division 00    Status of Unit ISSUED,FINAL    Transfusion Status OK TO TRANSFUSE    Crossmatch Result Compatible   Prepare RBC     Status: None   Collection Time: 07/27/18 12:50 PM  Result Value Ref Range   Order Confirmation      ORDER PROCESSED BY BLOOD BANK Performed at [facility], [place] [address] [place], [place] [address]   POC occult blood, ED Provider will collect     Status: Abnormal   Collection Time: 07/27/18  1:20 PM  Result Value Ref Range   Fecal Occult Bld POSITIVE (A) NEGATIVE  Hemoglobin A1c     Status: Abnormal   Collection Time: 07/27/18  4:32 PM  Result Value Ref Range   Hgb A1c MFr Bld 5.8 (H) 4.8 - 5.6 %    Comment: (NOTE) Pre diabetes:          5.7%-6.4% Diabetes:              >6.4% Glycemic control for   <7.0% adults with diabetes    Mean Plasma Glucose 119.76 mg/dL    Comment: Performed at [facility] [address], [place], [place] [address]  Glucose, capillary     Status: Abnormal   Collection Time: 07/27/18  5:13 PM  Result Value Ref Range   Glucose-Capillary 107 (H) 70 - 99  mg/dL  Glucose, capillary     Status: None   Collection Time: 07/27/18 10:09 PM  Result Value Ref Range   Glucose-Capillary 85 70 - 99 mg/dL  Basic metabolic panel     Status: Abnormal   Collection  Time: 07/27/18 11:53 PM  Result Value Ref Range   Sodium 138 135 - 145 mmol/L   Potassium 4.1 3.5 - 5.1 mmol/L   Chloride 105 98 - 111 mmol/L   CO2 24 22 - 32 mmol/L   Glucose, Bld 98 70 - 99 mg/dL   BUN 10 6 - 20 mg/dL   Creatinine, Ser 0.58 0.44 - 1.00 mg/dL   Calcium 8.7 (L) 8.9 - 10.3 mg/dL   GFR calc non Af Amer >60 >60 mL/min   GFR calc Af Amer >60 >60 mL/min    Comment: (NOTE) The eGFR has been calculated using the CKD EPI equation. This calculation has not been validated in all clinical situations. eGFR's persistently <60 mL/min signify possible Chronic Kidney Disease.    Anion gap 9 5 - 15    Comment: Performed at [facility] [address] [place], [place] [address]  CBC     Status: Abnormal   Collection Time: 07/27/18 11:53 PM  Result Value Ref Range   WBC 7.9 4.0 - 10.5 K/uL   RBC 3.59 (L) 3.87 - 5.11 MIL/uL   Hemoglobin 7.5 (L) 12.0 - 15.0 g/dL   HCT 25.7 (L) 36.0 - 46.0 %   MCV 71.6 (L) 78.0 - 100.0 fL   MCH 20.9 (L) 26.0 - 34.0 pg   MCHC 29.2 (L) 30.0 - 36.0 g/dL   RDW 18.5 (H) 11.5 - 15.5 %   Platelets 377 150 - 400 K/uL    Comment: Performed at [facility], [place] [address], [place], [place] [address]  Glucose, capillary     Status: Abnormal   Collection Time: 07/28/18  7:32 AM  Result Value Ref Range   Glucose-Capillary 102 (H) 70 - 99 mg/dL  Glucose, capillary     Status: Abnormal   Collection Time: 07/28/18  2:10 PM  Result Value Ref Range   Glucose-Capillary 110 (H) 70 - 99 mg/dL   Dg Chest 2 View  Result Date: 07/27/2018 CLINICAL DATA:  Mid chest pain, shortness of breath EXAM: CHEST - 2 VIEW COMPARISON:  06/27/2018 FINDINGS: Heart and mediastinal contours are within normal limits. No focal opacities or effusions. No acute bony  abnormality. IMPRESSION: No active cardiopulmonary disease. Electronically Signed   By: Rolm Baptise M.D.   On: 07/27/2018 11:09   Assessment/Plan CVA - takes daily ASA 53m Obesity HTN HLD OSA DM Depression   Acute blood loss anemia Bleeding internal and external hemorrhoids  - Patient has had bleeding internal and external hemorrhoids daily for 1 month causing significant acute blood loss anemia, to the point where she required a blood transfusion. Continues to have bleeding today. Will order tucks pads TID and steroid suppositories BID. Start on bowel regimen (miralax/colace). Will continue to monitor. Hemorrhoids do not typically require emergent surgery, but if the bleeding does not stop with conservative management may need to consider hemorrhoidectomy. Will discuss with MD and continue to follow.   [facility] 07/28/2018, 2:56 PM Pager: [phone]: [phone] 7:00 am -11:30 AM Tues-Fri 7:00 am-4:30 pm Sat-Sun 7:00 am-11:30 am

## 2018-07-28 NOTE — Op Note (Signed)
[facility] Patient Name: Nicole [name] Procedure Date : 07/28/2018 MRN: [id number] Attending MD: Kathi Der , MD Date of Birth: [birth year] CSN: [id number] Age: 52 Admit Type: Inpatient Procedure:                Flexible Sigmoidoscopy Indications:              Rectal hemorrhage Providers:                Kathi Der, MD, Corrie Dandy, Technician Referring MD:              Medicines:                None Complications:            No immediate complications. Estimated Blood Loss:     Estimated blood loss: none. Estimated blood loss:                            none. Procedure:                Pre-Anesthesia Assessment:                           - Prior to the procedure, a History and Physical                            was performed, and patient medications and                            allergies were reviewed. The patient's tolerance of                            previous anesthesia was also reviewed. The risks                            and benefits of the procedure and the sedation                            options and risks were discussed with the patient.                            All questions were answered, and informed consent                            was obtained. Prior Anticoagulants: The patient has                            taken no previous anticoagulant or antiplatelet                            agents. After reviewing the risks and benefits, the                            patient was deemed in satisfactory condition to  undergo the procedure.                           After obtaining informed consent, the scope was                            passed under direct vision. The [id number]-H190 ([id number])                            Olympus Adult EGD was introduced through the anus                            and advanced to the 40 cm from the anal verge. The   flexible sigmoidoscopy was accomplished without                            difficulty. The patient tolerated the procedure                            well. The quality of the bowel preparation was good. Scope In: Scope Out: Findings:      The perianal exam findings include external hemorrhoidswith evidence of       bleeding.      There is no endoscopic evidence of bleeding or diverticula found up to       40 cm Prox to Anus.      Bleeding internal hemorrhoids were found during retroflexion. The       hemorrhoids were medium-sized. Impression:               - Hemorrhoids found on perianal exam.                           - No specimens collected. Recommendation:           - Return patient to hospital ward for ongoing care.                           - Soft diet.                           - Refer to a surgeon at appointment to be scheduled. Procedure Code(s):        --- Professional ---                           [phone], Sigmoidoscopy, flexible; diagnostic,                            including collection of specimen(s) by brushing or                            washing, when performed (separate procedure) Diagnosis Code(s):        --- Professional ---                           K64.9, Unspecified hemorrhoids  K62.5, Hemorrhage of anus and rectum CPT copyright 2017 American Medical Association. All rights reserved. The codes documented in this report are preliminary and upon coder review may  be revised to meet current compliance requirements. Kathi Der, MD Kathi Der, MD 07/28/2018 12:05:07 PM Number of Addenda: 0

## 2018-07-29 ENCOUNTER — Encounter (HOSPITAL_COMMUNITY): Payer: Self-pay | Admitting: Certified Registered"

## 2018-07-29 ENCOUNTER — Inpatient Hospital Stay (HOSPITAL_COMMUNITY): Admitting: Certified Registered"

## 2018-07-29 ENCOUNTER — Encounter (HOSPITAL_COMMUNITY)
Admission: EM | Disposition: A | Discharge status: Home / Self Care | Payer: Self-pay | Source: Home / Self Care | Attending: Internal Medicine

## 2018-07-29 HISTORY — PX: HEMORRHOID SURGERY: SHX153

## 2018-07-29 LAB — GLUCOSE, CAPILLARY
GLUCOSE-CAPILLARY: 134 mg/dL — AB (ref 70–99)
Glucose-Capillary: 105 mg/dL — ABNORMAL HIGH (ref 70–99)
Glucose-Capillary: 109 mg/dL — ABNORMAL HIGH (ref 70–99)
Glucose-Capillary: 106 mg/dL — ABNORMAL HIGH (ref 70–99)
Glucose-Capillary: 95 mg/dL (ref 70–99)

## 2018-07-29 LAB — HEMOGLOBIN AND HEMATOCRIT, BLOOD
HCT: 26.9 % — ABNORMAL LOW (ref 36.0–46.0)
HEMOGLOBIN: 7.8 g/dL — AB (ref 12.0–15.0)

## 2018-07-29 LAB — CBC
HCT: 22.3 % — ABNORMAL LOW (ref 36.0–46.0)
HEMOGLOBIN: 6.6 g/dL — AB (ref 12.0–15.0)
MCH: 21.5 pg — ABNORMAL LOW (ref 26.0–34.0)
MCHC: 29.6 g/dL — ABNORMAL LOW (ref 30.0–36.0)
MCV: 72.6 fL — AB (ref 78.0–100.0)
Platelets: 356 10*3/uL (ref 150–400)
RBC: 3.07 MIL/uL — AB (ref 3.87–5.11)
RDW: 18.6 % — ABNORMAL HIGH (ref 11.5–15.5)
WBC: 8.7 10*3/uL (ref 4.0–10.5)

## 2018-07-29 LAB — PREPARE RBC (CROSSMATCH)

## 2018-07-29 SURGERY — HEMORRHOIDECTOMY
Anesthesia: General | Site: Rectum

## 2018-07-29 MED ORDER — BUPIVACAINE-EPINEPHRINE (PF) 0.5% -1:200000 IJ SOLN
INTRAMUSCULAR | Status: AC
  Filled 2018-07-29: qty 30

## 2018-07-29 MED ORDER — BUPIVACAINE-EPINEPHRINE 0.5% -1:200000 IJ SOLN
INTRAMUSCULAR | Status: DC | PRN
  Administered 2018-07-29: 10 mL

## 2018-07-29 MED ORDER — MEPERIDINE HCL 50 MG/ML IJ SOLN: 6.2500 mg | INTRAMUSCULAR | Status: DC | PRN

## 2018-07-29 MED ORDER — THROMBIN 20000 UNITS EX KIT
PACK | CUTANEOUS | Status: DC | PRN
  Administered 2018-07-29: 20 mL via TOPICAL

## 2018-07-29 MED ORDER — BUPIVACAINE LIPOSOME 1.3 % IJ SUSP
INTRAMUSCULAR | Status: DC | PRN
  Administered 2018-07-29: 20 mL

## 2018-07-29 MED ORDER — 0.9 % SODIUM CHLORIDE (POUR BTL) OPTIME
TOPICAL | Status: DC | PRN
  Administered 2018-07-29: 1000 mL

## 2018-07-29 MED ORDER — MIDAZOLAM HCL 2 MG/2ML IJ SOLN
INTRAMUSCULAR | Status: AC
  Filled 2018-07-29: qty 2

## 2018-07-29 MED ORDER — FENTANYL CITRATE (PF) 250 MCG/5ML IJ SOLN
INTRAMUSCULAR | Status: AC
  Filled 2018-07-29: qty 5

## 2018-07-29 MED ORDER — OXYCODONE HCL 5 MG PO TABS
5.0000 mg | ORAL_TABLET | ORAL | Status: DC | PRN
  Administered 2018-07-29 – 2018-07-31 (×4): 10 mg via ORAL
  Filled 2018-07-29 (×4): qty 2

## 2018-07-29 MED ORDER — ONDANSETRON HCL 4 MG/2ML IJ SOLN
INTRAMUSCULAR | Status: AC
  Filled 2018-07-29: qty 2

## 2018-07-29 MED ORDER — PROPOFOL 10 MG/ML IV BOLUS
INTRAVENOUS | Status: DC | PRN
  Administered 2018-07-29: 100 mg via INTRAVENOUS

## 2018-07-29 MED ORDER — MIDAZOLAM HCL 5 MG/5ML IJ SOLN
INTRAMUSCULAR | Status: DC | PRN
  Administered 2018-07-29: 2 mg via INTRAVENOUS

## 2018-07-29 MED ORDER — SODIUM CHLORIDE 0.9 % IV BOLUS
1000.0000 mL | Freq: Once | INTRAVENOUS | Status: AC
  Administered 2018-07-29: 1000 mL via INTRAVENOUS

## 2018-07-29 MED ORDER — ONDANSETRON HCL 4 MG/2ML IJ SOLN: 4.0000 mg | Freq: Once | INTRAMUSCULAR | Status: DC | PRN

## 2018-07-29 MED ORDER — CEFAZOLIN SODIUM-DEXTROSE 2-3 GM-%(50ML) IV SOLR
INTRAVENOUS | Status: DC | PRN
  Administered 2018-07-29: 2 g via INTRAVENOUS

## 2018-07-29 MED ORDER — LIDOCAINE 2% (20 MG/ML) 5 ML SYRINGE
INTRAMUSCULAR | Status: DC | PRN
  Administered 2018-07-29: 100 mg via INTRAVENOUS

## 2018-07-29 MED ORDER — HYDROMORPHONE HCL 1 MG/ML IJ SOLN: 0.2500 mg | INTRAMUSCULAR | Status: DC | PRN

## 2018-07-29 MED ORDER — THROMBIN 20000 UNITS EX KIT
20000.0000 [IU] | PACK | CUTANEOUS | Status: AC
  Filled 2018-07-29: qty 1

## 2018-07-29 MED ORDER — PROPOFOL 10 MG/ML IV BOLUS
INTRAVENOUS | Status: AC
  Filled 2018-07-29: qty 20

## 2018-07-29 MED ORDER — DEXAMETHASONE SODIUM PHOSPHATE 10 MG/ML IJ SOLN
INTRAMUSCULAR | Status: AC
  Filled 2018-07-29: qty 1

## 2018-07-29 MED ORDER — PHENYLEPHRINE 40 MCG/ML (10ML) SYRINGE FOR IV PUSH (FOR BLOOD PRESSURE SUPPORT)
PREFILLED_SYRINGE | INTRAVENOUS | Status: AC
  Filled 2018-07-29: qty 10

## 2018-07-29 MED ORDER — SODIUM CHLORIDE 0.9% IV SOLUTION: Freq: Once | INTRAVENOUS | Status: AC

## 2018-07-29 MED ORDER — FENTANYL CITRATE (PF) 100 MCG/2ML IJ SOLN
INTRAMUSCULAR | Status: DC | PRN
  Administered 2018-07-29 (×3): 50 ug via INTRAVENOUS

## 2018-07-29 MED ORDER — PHENYLEPHRINE HCL 10 MG/ML IJ SOLN
INTRAMUSCULAR | Status: DC | PRN
  Administered 2018-07-29: 80 ug via INTRAVENOUS

## 2018-07-29 MED ORDER — WITCH HAZEL-GLYCERIN EX PADS
1.0000 "application " | MEDICATED_PAD | Freq: Three times a day (TID) | CUTANEOUS | Status: DC
  Administered 2018-07-29 – 2018-07-30 (×4): 1 via TOPICAL
  Filled 2018-07-29: qty 40
  Filled 2018-07-29: qty 100
  Filled 2018-07-29: qty 40

## 2018-07-29 MED ORDER — LIDOCAINE 2% (20 MG/ML) 5 ML SYRINGE
INTRAMUSCULAR | Status: AC
  Filled 2018-07-29: qty 5

## 2018-07-29 MED ORDER — BUPIVACAINE LIPOSOME 1.3 % IJ SUSP
20.0000 mL | INTRAMUSCULAR | Status: AC
  Filled 2018-07-29: qty 20

## 2018-07-29 MED ORDER — FENTANYL CITRATE (PF) 100 MCG/2ML IJ SOLN
25.0000 ug | INTRAMUSCULAR | Status: DC | PRN
  Administered 2018-07-29 – 2018-07-30 (×2): 25 ug via INTRAVENOUS
  Filled 2018-07-29 (×2): qty 2

## 2018-07-29 MED ORDER — ONDANSETRON HCL 4 MG/2ML IJ SOLN
INTRAMUSCULAR | Status: DC | PRN
  Administered 2018-07-29: 4 mg via INTRAVENOUS

## 2018-07-29 SURGICAL SUPPLY — 39 items
BLADE SURG 15 STRL LF DISP TIS (BLADE) ×1 IMPLANT
BLADE SURG 15 STRL SS (BLADE) ×1
CANISTER SUCT 3000ML PPV (MISCELLANEOUS) ×2 IMPLANT
COVER MAYO STAND STRL (DRAPES) ×2 IMPLANT
COVER SURGICAL LIGHT HANDLE (MISCELLANEOUS) ×2 IMPLANT
DRAPE UTILITY XL STRL (DRAPES) ×4 IMPLANT
ELECT CAUTERY BLADE 6.4 (BLADE) ×2 IMPLANT
ELECT REM PT RETURN 9FT ADLT (ELECTROSURGICAL) ×2
ELECTRODE REM PT RTRN 9FT ADLT (ELECTROSURGICAL) ×1 IMPLANT
GAUZE 4X4 16PLY RFD (DISPOSABLE) ×2 IMPLANT
GAUZE SPONGE 4X4 12PLY STRL (GAUZE/BANDAGES/DRESSINGS) ×2 IMPLANT
GLOVE BIO SURGEON STRL SZ8 (GLOVE) ×2 IMPLANT
GLOVE BIOGEL PI IND STRL 8 (GLOVE) ×1 IMPLANT
GLOVE BIOGEL PI INDICATOR 8 (GLOVE) ×1
GOWN STRL REUS W/ TWL LRG LVL3 (GOWN DISPOSABLE) ×1 IMPLANT
GOWN STRL REUS W/ TWL XL LVL3 (GOWN DISPOSABLE) ×1 IMPLANT
GOWN STRL REUS W/TWL LRG LVL3 (GOWN DISPOSABLE) ×1
GOWN STRL REUS W/TWL XL LVL3 (GOWN DISPOSABLE) ×1
HOVERMATT SINGLE USE (MISCELLANEOUS) ×2 IMPLANT
KIT BASIN OR (CUSTOM PROCEDURE TRAY) ×2 IMPLANT
KIT TURNOVER KIT B (KITS) ×2 IMPLANT
NEEDLE 22X1 1/2 (OR ONLY) (NEEDLE) ×2 IMPLANT
NS IRRIG 1000ML POUR BTL (IV SOLUTION) ×2 IMPLANT
PACK LITHOTOMY IV (CUSTOM PROCEDURE TRAY) ×2 IMPLANT
PAD ABD 8X10 STRL (GAUZE/BANDAGES/DRESSINGS) ×2 IMPLANT
PAD ARMBOARD 7.5X6 YLW CONV (MISCELLANEOUS) ×2 IMPLANT
PENCIL BUTTON HOLSTER BLD 10FT (ELECTRODE) ×2 IMPLANT
SHEARS HARMONIC 9CM CVD (BLADE) ×2 IMPLANT
SPONGE SURGIFOAM ABS GEL 100 (HEMOSTASIS) ×2 IMPLANT
SURGILUBE 2OZ TUBE FLIPTOP (MISCELLANEOUS) ×2 IMPLANT
SUT CHROMIC 2 0 SH (SUTURE) ×2 IMPLANT
SUT CHROMIC 3 0 SH 27 (SUTURE) ×2 IMPLANT
SUT VIC AB 3-0 SH 27 (SUTURE) ×1
SUT VIC AB 3-0 SH 27X BRD (SUTURE) ×1 IMPLANT
SYR CONTROL 10ML LL (SYRINGE) ×2 IMPLANT
TOWEL OR 17X24 6PK STRL BLUE (TOWEL DISPOSABLE) ×2 IMPLANT
TUBE CONNECTING 12X1/4 (SUCTIONS) ×2 IMPLANT
UNDERPAD 30X30 (UNDERPADS AND DIAPERS) ×2 IMPLANT
YANKAUER SUCT BULB TIP NO VENT (SUCTIONS) ×2 IMPLANT

## 2018-07-29 NOTE — Anesthesia Postprocedure Evaluation (Signed)
Anesthesia Post Note  Patient: Nicole [name]  Procedure(s) Performed: HEMORRHOIDECTOMY (N/A Rectum)     Patient location during evaluation: PACU Anesthesia Type: General Level of consciousness: awake and alert Pain management: pain level controlled Vital Signs Assessment: post-procedure vital signs reviewed and stable Respiratory status: spontaneous breathing, nonlabored ventilation, respiratory function stable and patient connected to nasal cannula oxygen Cardiovascular status: blood pressure returned to baseline and stable Postop Assessment: no apparent nausea or vomiting Anesthetic complications: no    Last Vitals:  Vitals:   07/29/18 1159 07/29/18 1217  BP:  117/67  Pulse: 78 79  Resp: 17 18  Temp:  36.6 C  SpO2: 96% 99%    Last Pain:  Vitals:   07/29/18 1217  TempSrc: Oral  PainSc:                  Eyden Dobie DAVID

## 2018-07-29 NOTE — Progress Notes (Addendum)
Patient states she has had 3 bright red bowel movements since 1900 tonight.  Last BM at 0125 showed moderate amount of bright red blood in toilet.  Patient appears short of breath and is complaining of slight headache and dizziness ("room is spinning").  B/P at 0133 is 88/62.  No labs since 07/27/2018.  Blount from [employer] made aware.  Stat orders received and implemented.  Will continue to monitor patient and call provider as soon as results of CBC are back.  Gal Smolinski RN-BC, WTA  Hemoglobin is 6.6 and hematocrit is 22.3.  [facility] made aware.  Order to transfuse 2 Units PRBC received.  Will continue to monitor patient.  Bernie Covey RN-BC, [employer]

## 2018-07-29 NOTE — Anesthesia Procedure Notes (Signed)
Procedure Name: LMA Insertion Date/Time: 07/29/2018 7:42 AM Performed by: Sheppard Evens, CRNA Pre-anesthesia Checklist: Patient identified, Emergency Drugs available, Suction available and Patient being monitored Patient Re-evaluated:Patient Re-evaluated prior to induction Oxygen Delivery Method: Circle System Utilized Preoxygenation: Pre-oxygenation with 100% oxygen Induction Type: IV induction Ventilation: Mask ventilation without difficulty LMA: LMA inserted LMA Size: 4.0 Number of attempts: 1 Airway Equipment and Method: Bite block Placement Confirmation: positive ETCO2 Tube secured with: Tape Dental Injury: Teeth and Oropharynx as per pre-operative assessment

## 2018-07-29 NOTE — Transfer of Care (Signed)
Immediate Anesthesia Transfer of Care Note  Patient: Nicole [name]  Procedure(s) Performed: HEMORRHOIDECTOMY (N/A Rectum)  Patient Location: PACU  Anesthesia Type:General  Level of Consciousness: responds to stimulation  Airway & Oxygen Therapy: Patient Spontanous Breathing and Patient connected to face mask oxygen  Post-op Assessment: Report given to RN and Post -op Vital signs reviewed and stable  Post vital signs: Reviewed and stable  Last Vitals:  Vitals Value Taken Time  BP 110/68 07/29/2018 10:57 AM  Temp    Pulse 71 07/29/2018 10:57 AM  Resp 14 07/29/2018 10:57 AM  SpO2 99 % 07/29/2018 10:57 AM  Vitals shown include unvalidated device data.  Last Pain:  Vitals:   07/29/18 0827  TempSrc: Oral  PainSc:          Complications: No apparent anesthesia complications

## 2018-07-29 NOTE — Op Note (Signed)
07/27/2018 - 07/29/2018  10:49 AM  PATIENT:  Nicole [name]  52 y.o. female  PRE-OPERATIVE DIAGNOSIS:  Bleeding Internal Hemorrhoids  POST-OPERATIVE DIAGNOSIS:  Bleeding Internal Hemorrhoids  PROCEDURE:  Procedure(s): 3 column internal hemorrhoidectomy  SURGEON:  Surgeon(s): Violeta Gelinas, MD  ASSISTANTS: none   ANESTHESIA:   local and general  EBL:  No intake/output data recorded.  BLOOD ADMINISTERED:none  DRAINS: none   SPECIMEN:  Excision  DISPOSITION OF SPECIMEN:  PATHOLOGY  COUNTS:  YES  DICTATION: .Dragon Dictation Findings: Extensive circumferential internal and external hemorrhoids with active bleeding from internal hemorrhoid  Procedure in detail: Patient is brought for emergent hemorrhoidectomy for ongoing active bleeding requiring transfusion in the past 2 days.  Informed consent was obtained.  She received intravenous antibiotics.  She was brought to the operating room and general anesthesia was administered by the anesthesia staff.  She was placed in lithotomy position and her perianal region was prepped and draped in a sterile fashion.  We did a timeout procedure.  I then injected Exparel mixed with local anesthetic in the perianal region.  Rectal and endorectal exam reveals circumferential severe large internal continuous with external hemorrhoids.  There is active bleeding from an internal hemorrhoid at 10:00.  I proceeded with the first column internal hemorrhoidectomy in that location.  I used a Haney clamp and a harmonic scalpel to excise also the contiguous external hemorrhoid and then continuing well above the sphincters to excise the hemorrhoidal vessel using the harmonic scalpel and this stopped the active bleeding.  There was also other severe internal hemorrhoids at 7:00 and at 3:00 on the other side.  Similarly I excised the contiguous external hemorrhoids with each of these internal hemorrhoids.  I used Haney clamp and the hemorrhoid was excised using  harmonic scalpel at each location.  I stayed superficial to the sphincters.  All of the hemorrhoid tissue was sent to pathology.  I then used cautery to ensure excellent hemostasis from all the hemorrhoidectomy sites.  At this point, I placed an endorectal dressing with Gelfoam soaked in thrombin.  All counts were correct.  She tolerated the procedure well and was taken recovery in stable condition. PATIENT DISPOSITION:  PACU - hemodynamically stable.   Delay start of Pharmacological VTE agent (>24hrs) due to surgical blood loss or risk of bleeding:  yes  Violeta Gelinas, MD, MPH, FACS Pager: [phone]  9/14/201910:49 AM

## 2018-07-29 NOTE — Progress Notes (Signed)
PRBC finished in OR - not present on arrival to PACU- completed at 1030

## 2018-07-29 NOTE — Progress Notes (Signed)
1 Day Post-Op   Subjective/Chief Complaint: Multiple bloody BM overnight, receiving 2u PRBC   Objective: Vital signs in last 24 hours: Temp:  [97.2 F (36.2 C)-98.4 F (36.9 C)] (P) 97.2 F (36.2 C) (09/14 0812) Pulse Rate:  [74-91] (P) 74 (09/14 0812) Resp:  [11-23] (P) 17 (09/14 0812) BP: (88-132)/(51-73) (P) 107/59 (09/14 0812) SpO2:  [93 %-100 %] (P) 97 % (09/14 0812) Weight:  [120.2 kg] 120.2 kg (09/13 1150) Last BM Date: 07/28/18  Intake/Output from previous day: 09/13 0701 - 09/14 0700 In: 2813.2 [P.O.:1340; I.V.:1166.2; Blood:307] Out: 0  Intake/Output this shift: No intake/output data recorded.  General appearance: alert and cooperative Resp: clear to auscultation bilaterally Cardio: regular rate and rhythm GI: soft, NT Rectal some ext blood  Lab Results:  Recent Labs    07/27/18 2353 07/29/18 0239  WBC 7.9 8.7  HGB 7.5* 6.6*  HCT 25.7* 22.3*  PLT 377 356   BMET Recent Labs    07/27/18 1045 07/27/18 2353  NA 138 138  K 4.1 4.1  CL 105 105  CO2 26 24  GLUCOSE 109* 98  BUN 18 10  CREATININE 0.70 0.58  CALCIUM 9.0 8.7*   PT/INR Recent Labs    07/27/18 1248  LABPROT 13.2  INR 1.01   ABG No results for input(s): PHART, HCO3 in the last 72 hours.  Invalid input(s): PCO2, PO2  Studies/Results: Dg Chest 2 View  Result Date: 07/27/2018 CLINICAL DATA:  Mid chest pain, shortness of breath EXAM: CHEST - 2 VIEW COMPARISON:  06/27/2018 FINDINGS: Heart and mediastinal contours are within normal limits. No focal opacities or effusions. No acute bony abnormality. IMPRESSION: No active cardiopulmonary disease. Electronically Signed   By: Charlett Nose M.D.   On: 07/27/2018 11:09    Anti-infectives: Anti-infectives (From admission, onward)   None      Assessment/Plan: Bleeding hemorrhoids - ongoing bleeding requiring transfusion yesterday and again today. Will proceed with emergent hemorrhoidectomy. I discussed the procedure, risks, and benefits  with her. I discussed the expected post-op course. She agrees.  LOS: 2 days    Liz Malady 07/29/2018

## 2018-07-29 NOTE — Anesthesia Preprocedure Evaluation (Signed)
Anesthesia Evaluation  Patient identified by MRN, date of birth, ID band Patient awake    Reviewed: Allergy & Precautions, NPO status , Patient's Chart, lab work & pertinent test results  Airway Mallampati: II  TM Distance: >3 FB Neck ROM: Full    Dental   Pulmonary asthma , sleep apnea , former smoker,    Pulmonary exam normal        Cardiovascular hypertension, Pt. on medications Normal cardiovascular exam     Neuro/Psych Anxiety Depression Bipolar Disorder CVA    GI/Hepatic   Endo/Other  diabetes, Type 2, Oral Hypoglycemic AgentsMorbid obesity  Renal/GU      Musculoskeletal   Abdominal   Peds  Hematology   Anesthesia Other Findings   Reproductive/Obstetrics                             Anesthesia Physical Anesthesia Plan  ASA: III  Anesthesia Plan: General   Post-op Pain Management:    Induction: Intravenous  PONV Risk Score and Plan: 3  Airway Management Planned: LMA  Additional Equipment:   Intra-op Plan:   Post-operative Plan: Extubation in OR  Informed Consent: I have reviewed the patients History and Physical, chart, labs and discussed the procedure including the risks, benefits and alternatives for the proposed anesthesia with the patient or authorized representative who has indicated his/her understanding and acceptance.     Plan Discussed with: Surgeon and CRNA  Anesthesia Plan Comments:         Anesthesia Quick Evaluation

## 2018-07-29 NOTE — Progress Notes (Signed)
PROGRESS NOTE    Nicole [name]  ZOX:[id number] DOB: 02-08-1966 DOA: 07/27/2018 PCP: Verl Bangs, MD   Brief Narrative:  Nicole [name] is a 52 y.o. female with medical history significant of CVA (left PICA CVA in 2/19); obesity; HTN; HLD; OSA; DM; and depression presenting with chest pain.  She has been having rectal bleeding for a month. She had a screening colonoscopy in march 2018 was found to have hemorrhoids. She was found to be anemic on admission, underwent 2 units of prbc transfusion and GI consulted. she underwent flexible sigmoidoscopy was found to have internal and external hemorrhoids. Surgery consulted for further recommendations and possible hemorrhoidectomy, .  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 Atypical.  No dizziness this am. Troponin negative.  Overnight pt became symptomatic with dizziness and hypotension after 3 bloody bowel movements.  .   Symptomatic anemia/ acute anemia of blood loss From rectal bleeding from hemorrhoids,.  S/p  A total of 4 units of prbc transfusion. Overnight she had more bleeding and became symptomatic with dizziness and hypotension. She received another 2 units of prbc.  Repeat H&H is stable around 7.8.  She underwent flexible sig showing both external and internal hemorrhoids.  Surgery consulted for possible hemorrhoidectomy. She underwent hemorrhoidectomy of the internal hemorrhoids. Anemia panel will be ordered.    Hypertension:  Well controlled after the prbc transfusion. Hold cardizem and BB if SBP is less than 100.    H/o CVA;  Not on aspirin because of rectal bleed.  On lipitor.    DM:  CBG (last 3)    Recent Labs    07/29/18 0722 07/29/18 1058 07/29/18 1215  GLUCAP 105* 109* 106*   A1c is 5. 8. No changes in  Meds. Continue with SSI.     OSA on CPAP:      DVT prophylaxis: scd's. Code Status: full code.  Family Communication: family at bedside.  Disposition Plan: pending further work up by surgery.    Consultants:   Gastroenterology  Surgery.    Procedures: Flexible sigmoidoscopy.    Antimicrobials: none.    Subjective: Sleepy, no chest pain or sob. Feels tired.   Objective: Vitals:   07/29/18 1157 07/29/18 1158 07/29/18 1159 07/29/18 1217  BP:  130/72  117/67  Pulse: 83 79 78 79  Resp: 19 18 17 18   Temp:  97.8 F (36.6 C)  97.8 F (36.6 C)  TempSrc:    Oral  SpO2: 97% 95% 96% 99%  Weight:      Height:        Intake/Output Summary (Last 24 hours) at 07/29/2018 1225 Last data filed at 07/29/2018 1152 Gross per 24 hour  Intake 3293.22 ml  Output 50 ml  Net 3243.22 ml   Filed Weights   07/27/18 1032 07/28/18 1150  Weight: 120.2 kg 120.2 kg    Examination:  General exam: Appears calm and comfortable  Respiratory system: Clear to auscultation. Respiratory effort normal. Cardiovascular system: S1 & S2 heard, RRR. No JVD, murmurs Gastrointestinal system: Abdomen is nondistended, soft and nontender. No organomegaly or masses felt. Normal bowel sounds heard. Central nervous system: Alert and oriented. No focal neurological deficits. Extremities: Symmetric  5 x 5 power. Skin: No rashes, lesions or ulcers Psychiatry: . Mood & affect appropriate.     Data Reviewed: I have personally reviewed following labs and imaging studies  CBC: Recent Labs  Lab 07/27/18 1045 07/27/18 2353 07/29/18 0239  WBC 9.3 7.9 8.7  HGB 6.4* 7.5* 6.6*  HCT 23.1* 25.7* 22.3*  MCV 69.8* 71.6* 72.6*  PLT 469* 377 356   Basic Metabolic Panel: Recent Labs  Lab 07/27/18 1045 07/27/18 2353  NA 138 138  K 4.1 4.1  CL 105 105  CO2 26 24  GLUCOSE 109* 98  BUN 18  10  CREATININE 0.70 0.58  CALCIUM 9.0 8.7*   GFR: Estimated Creatinine Clearance: 105.6 mL/min (by C-G formula based on SCr of 0.58 mg/dL). Liver Function Tests: Recent Labs  Lab 07/27/18 1248  AST 15  ALT 14  ALKPHOS 60  BILITOT 0.5  PROT 6.9  ALBUMIN 3.4*   No results for input(s): LIPASE, AMYLASE in the last 168 hours. No results for input(s): AMMONIA in the last 168 hours. Coagulation Profile: Recent Labs  Lab 07/27/18 1248  INR 1.01   Cardiac Enzymes: No results for input(s): CKTOTAL, CKMB, CKMBINDEX, TROPONINI in the last 168 hours. BNP (last 3 results) No results for input(s): PROBNP in the last 8760 hours. HbA1C: Recent Labs    07/27/18 1632  HGBA1C 5.8*   CBG: Recent Labs  Lab 07/28/18 1627 07/28/18 2115 07/29/18 0722 07/29/18 1058 07/29/18 1215  GLUCAP 110* 130* 105* 109* 106*   Lipid Profile: No results for input(s): CHOL, HDL, LDLCALC, TRIG, CHOLHDL, LDLDIRECT in the last 72 hours. Thyroid Function Tests: No results for input(s): TSH, T4TOTAL, FREET4, T3FREE, THYROIDAB in the last 72 hours. Anemia Panel: No results for input(s): VITAMINB12, FOLATE, FERRITIN, TIBC, IRON, RETICCTPCT in the last 72 hours. Sepsis Labs: No results for input(s): PROCALCITON, LATICACIDVEN in the last 168 hours.  No results found for this or any previous visit (from the past 240 hour(s)).       Radiology Studies: No results found.      Scheduled Meds: . atorvastatin  80 mg Oral Daily  . bupivacaine liposome  20 mL Infiltration To OR  . diltiazem  240 mg Oral Daily  . docusate sodium  100 mg Oral BID  . FLUoxetine  40 mg Oral BID  . hydrocortisone  25 mg Rectal BID  . Influenza vac split quadrivalent PF  0.5 mL Intramuscular Tomorrow-1000  . insulin aspart  0-15 Units Subcutaneous TID WC  . insulin aspart  0-5 Units Subcutaneous QHS  . lidocaine  1 patch Transdermal Q24H  . metoprolol succinate  25 mg Oral Daily  . pantoprazole  40 mg Oral Daily  .  polyethylene glycol  17 g Oral Daily  . thrombin  20,000 Units Topical To OR  . witch hazel-glycerin  1 application Topical TID   Continuous Infusions: . lactated ringers 100 mL/hr at 07/29/18 0907     LOS: 2 days    Time spent: 35 minutes.     Kathlen Mody, MD [facility] Pager [phone]   If 7PM-7AM, please contact night-coverage [email or web address] Password [email or web address] 07/29/2018, 12:25 PM

## 2018-07-30 ENCOUNTER — Encounter (HOSPITAL_COMMUNITY): Payer: Self-pay | Admitting: General Surgery

## 2018-07-30 LAB — BPAM RBC
BLOOD PRODUCT EXPIRATION DATE: 201910072359
Blood Product Expiration Date: 201909172359
Blood Product Expiration Date: 201909172359
Blood Product Expiration Date: 201910072359
ISSUE DATE / TIME: 201909121434
ISSUE DATE / TIME: 201909140349
ISSUE DATE / TIME: 201909140801
ISSUE DATE / TIME: 201909121719
UNIT TYPE AND RH: 600
UNIT TYPE AND RH: 6200
Unit Type and Rh: 600
Unit Type and Rh: 6200

## 2018-07-30 LAB — TYPE AND SCREEN
ABO/RH(D): A POS
Antibody Screen: NEGATIVE
UNIT DIVISION: 0
UNIT DIVISION: 0
Unit division: 0
Unit division: 0

## 2018-07-30 LAB — BASIC METABOLIC PANEL
ANION GAP: 6 (ref 5–15)
BUN: 10 mg/dL (ref 6–20)
CHLORIDE: 106 mmol/L (ref 98–111)
CO2: 25 mmol/L (ref 22–32)
Calcium: 8.2 mg/dL — ABNORMAL LOW (ref 8.9–10.3)
Creatinine, Ser: 0.65 mg/dL (ref 0.44–1.00)
GFR calc Af Amer: >60 mL/min (ref >60)
Glucose, Bld: 126 mg/dL — ABNORMAL HIGH (ref 70–99)
POTASSIUM: 4.2 mmol/L (ref 3.5–5.1)
SODIUM: 137 mmol/L (ref 135–145)

## 2018-07-30 LAB — GLUCOSE, CAPILLARY
GLUCOSE-CAPILLARY: 110 mg/dL — AB (ref 70–99)
GLUCOSE-CAPILLARY: 93 mg/dL (ref 70–99)
GLUCOSE-CAPILLARY: 106 mg/dL — AB (ref 70–99)

## 2018-07-30 LAB — RETICULOCYTES
RBC.: 3.29 MIL/uL — ABNORMAL LOW (ref 3.87–5.11)
RETIC CT PCT: 1.4 % (ref 0.4–3.1)
Retic Count, Absolute: 46.1 10*3/uL (ref 19.0–186.0)

## 2018-07-30 LAB — FERRITIN: Ferritin: 8 ng/mL — ABNORMAL LOW (ref 11–307)

## 2018-07-30 LAB — CBC
HCT: 25.2 % — ABNORMAL LOW (ref 36.0–46.0)
HEMOGLOBIN: 7.4 g/dL — AB (ref 12.0–15.0)
MCH: 22.5 pg — AB (ref 26.0–34.0)
MCHC: 29.4 g/dL — ABNORMAL LOW (ref 30.0–36.0)
MCV: 76.6 fL — ABNORMAL LOW (ref 78.0–100.0)
PLATELETS: 312 10*3/uL (ref 150–400)
RBC: 3.29 MIL/uL — AB (ref 3.87–5.11)
RDW: 19.9 % — ABNORMAL HIGH (ref 11.5–15.5)
WBC: 9.8 10*3/uL (ref 4.0–10.5)

## 2018-07-30 LAB — FOLATE: FOLATE: 5.8 ng/mL — AB (ref >5.9)

## 2018-07-30 LAB — VITAMIN B12: VITAMIN B 12: 282 pg/mL (ref 180–914)

## 2018-07-30 LAB — IRON AND TIBC
IRON: 9 ug/dL — AB (ref 28–170)
SATURATION RATIOS: 3 % — AB (ref 10.4–31.8)
TIBC: 332 ug/dL (ref 250–450)
UIBC: 323 ug/dL

## 2018-07-30 MED ORDER — SODIUM CHLORIDE 0.9 % IV SOLN
25.0000 mg | Freq: Once | INTRAVENOUS | Status: AC
  Administered 2018-07-30: 25 mg via INTRAVENOUS
  Filled 2018-07-30: qty 0.5

## 2018-07-30 MED ORDER — SODIUM CHLORIDE 0.9 % IV SOLN
500.0000 mg | Freq: Once | INTRAVENOUS | Status: AC
  Administered 2018-07-30: 500 mg via INTRAVENOUS
  Filled 2018-07-30: qty 10

## 2018-07-30 MED ORDER — FOLIC ACID 1 MG PO TABS
1.0000 mg | ORAL_TABLET | Freq: Every day | ORAL | Status: DC
  Administered 2018-07-30 – 2018-07-31 (×2): 1 mg via ORAL
  Filled 2018-07-30 (×2): qty 1

## 2018-07-30 MED ORDER — TRAMADOL HCL 50 MG PO TABS
50.0000 mg | ORAL_TABLET | Freq: Once | ORAL | Status: AC
  Administered 2018-07-30: 50 mg via ORAL
  Filled 2018-07-30 (×2): qty 1

## 2018-07-30 NOTE — Progress Notes (Signed)
1 Day Post-Op   Subjective/Chief Complaint: More comfortable now but was having significant perianal pain as expected   Objective: Vital signs in last 24 hours: Temp:  [97.2 F (36.2 C)-98.5 F (36.9 C)] 98.5 F (36.9 C) (09/15 0508) Pulse Rate:  [71-98] 93 (09/15 0726) Resp:  [14-25] 20 (09/15 0726) BP: (107-131)/(59-88) 114/62 (09/15 0726) SpO2:  [93 %-100 %] 93 % (09/15 0726) Last BM Date: 07/28/18  Intake/Output from previous day: 09/14 0701 - 09/15 0700 In: 2915.9 [P.O.:420; I.V.:2495.9] Out: 50 [Blood:50] Intake/Output this shift: No intake/output data recorded.  Exam: Looks pale Lungs clear Cv RRR Perianal area stable  Lab Results:  Recent Labs    07/27/18 2353 07/29/18 0239 07/29/18 1453  WBC 7.9 8.7  --   HGB 7.5* 6.6* 7.8*  HCT 25.7* 22.3* 26.9*  PLT 377 356  --    BMET Recent Labs    07/27/18 1045 07/27/18 2353  NA 138 138  K 4.1 4.1  CL 105 105  CO2 26 24  GLUCOSE 109* 98  BUN 18 10  CREATININE 0.70 0.58  CALCIUM 9.0 8.7*   PT/INR Recent Labs    07/27/18 1248  LABPROT 13.2  INR 1.01   ABG No results for input(s): PHART, HCO3 in the last 72 hours.  Invalid input(s): PCO2, PO2  Studies/Results: No results found.  Anti-infectives: Anti-infectives (From admission, onward)   None      Assessment/Plan: s/p Procedure(s): HEMORRHOIDECTOMY (N/A)  If her H/H are stable, and her pain is controlled, she could be discharged today. From a hemorrhoid post op standpoint, we would recommend Lidocaine 5% cream to perianal area every 4 to 6 hours as needed for pain as well as sitz baths daily.  LOS: 3 days    Emerita Berkemeier A 07/30/2018

## 2018-07-30 NOTE — Progress Notes (Signed)
PROGRESS NOTE    Nicole [name]  JXB:[id number] DOB: 06-13-1966 DOA: 07/27/2018 PCP: Verl Bangs, MD   Brief Narrative:  Nicole [name] is a 52 y.o. female with medical history significant of CVA (left PICA CVA in 2/19); obesity; HTN; HLD; OSA; DM; and depression presenting with chest pain.  She has been having rectal bleeding for a month. She had a screening colonoscopy in march 2018 was found to have hemorrhoids. She was found to be anemic on admission, underwent 2 units of prbc transfusion and GI consulted. she underwent flexible sigmoidoscopy was found to have internal and external hemorrhoids. Surgery consulted for further recommendations and possible hemorrhoidectomy, .  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 Atypical.  No dizziness this am. Troponin negative.  Th night of 9/13 pt became symptomatic with dizziness and hypotension after 3 bloody bowel movements. Today asymptomatic.  Marland Kitchen   Symptomatic anemia/ acute anemia of blood loss From rectal bleeding from hemorrhoids,.  S/p  A total of 4 units of prbc transfusion. Repeat H&H is stable around 7.4.  She underwent flexible sig showing both external and internal hemorrhoids.  Surgery consulted for possible hemorrhoidectomy. She underwent hemorrhoidectomy of the internal hemorrhoids on9/14 Anemia panel ordered showed severe iron deficiency. Iron infusion ordered.    Hypertension:  Well controlled after the prbc transfusion. Hold cardizem and BB if SBP is less than 100.    H/o CVA;  Not on aspirin because of rectal bleed.  On lipitor.    DM:  CBG (last 3)  Recent Labs    07/29/18 2114 07/30/18 0725  07/30/18 1222  GLUCAP 95 110* 93   A1c is 5. 8. No changes in  Meds. Continue with SSI.     OSA on CPAP: stable.      DVT prophylaxis: scd's. Code Status: full code.  Family Communication: family at bedside.  Disposition Plan: d/c tomorrow if no bleeding.    Consultants:   Gastroenterology  Surgery.    Procedures: Flexible sigmoidoscopy.    Antimicrobials: none.    Subjective: No chest pain or sob.   Objective: Vitals:   07/29/18 1634 07/29/18 1947 07/30/18 0508 07/30/18 0726  BP: (!) 109/59 115/66 131/88 114/62  Pulse: 86 85 98 93  Resp: (!) 22 20 18 20   Temp:  98.2 F (36.8 C) 98.5 F (36.9 C) 97.6 F (36.4 C)  TempSrc:  Oral Oral Oral  SpO2: 93% 95% 93% 93%  Weight:      Height:        Intake/Output Summary (Last 24 hours) at 07/30/2018 1550 Last data filed at 07/30/2018 1130 Gross per 24 hour  Intake 2655.91 ml  Output -  Net 2655.91 ml   Filed Weights   07/27/18 1032 07/28/18 1150  Weight: 120.2 kg 120.2 kg    Examination:   General exam: Appears calm and comfortable , no  Distress noted.  Respiratory system: Clear to auscultation. Respiratory effort normal. No wheezing noted.  Cardiovascular system: S1 & S2 heard, RRR. No JVD, murmurs Gastrointestinal system: Abdomen is soft NT ND BS+ Central nervous system: Alert and oriented. No focal neurological deficits. Extremities: Symmetric 5 x 5 power. Skin: No rashes, lesions  or ulcers Psychiatry: . Mood & affect appropriate.     Data Reviewed: I have personally reviewed following labs and imaging studies  CBC: Recent Labs  Lab 07/27/18 1045 07/27/18 2353 07/29/18 0239 07/29/18 1453 07/30/18 0651  WBC 9.3 7.9 8.7  --  9.8  HGB 6.4* 7.5* 6.6* 7.8* 7.4*  HCT 23.1* 25.7* 22.3* 26.9* 25.2*  MCV 69.8* 71.6* 72.6*  --  76.6*  PLT 469* 377 356  --  312   Basic Metabolic Panel: Recent Labs  Lab 07/27/18 1045 07/27/18 2353 07/30/18 0651  NA 138 138 137  K 4.1 4.1 4.2  CL 105 105  106  CO2 26 24 25   GLUCOSE 109* 98 126*  BUN 18 10 10   CREATININE 0.70 0.58 0.65  CALCIUM 9.0 8.7* 8.2*   GFR: Estimated Creatinine Clearance: 105.6 mL/min (by C-G formula based on SCr of 0.65 mg/dL). Liver Function Tests: Recent Labs  Lab 07/27/18 1248  AST 15  ALT 14  ALKPHOS 60  BILITOT 0.5  PROT 6.9  ALBUMIN 3.4*   No results for input(s): LIPASE, AMYLASE in the last 168 hours. No results for input(s): AMMONIA in the last 168 hours. Coagulation Profile: Recent Labs  Lab 07/27/18 1248  INR 1.01   Cardiac Enzymes: No results for input(s): CKTOTAL, CKMB, CKMBINDEX, TROPONINI in the last 168 hours. BNP (last 3 results) No results for input(s): PROBNP in the last 8760 hours. HbA1C: Recent Labs    07/27/18 1632  HGBA1C 5.8*   CBG: Recent Labs  Lab 07/29/18 1215 07/29/18 1635 07/29/18 2114 07/30/18 0725 07/30/18 1222  GLUCAP 106* 134* 95 110* 93   Lipid Profile: No results for input(s): CHOL, HDL, LDLCALC, TRIG, CHOLHDL, LDLDIRECT in the last 72 hours. Thyroid Function Tests: No results for input(s): TSH, T4TOTAL, FREET4, T3FREE, THYROIDAB in the last 72 hours. Anemia Panel: Recent Labs    07/30/18 0651  VITAMINB12 282  FOLATE 5.8*  FERRITIN 8*  TIBC 332  IRON 9*  RETICCTPCT 1.4   Sepsis Labs: No results for input(s): PROCALCITON, LATICACIDVEN in the last 168 hours.  No results found for this or any previous visit (from the past 240 hour(s)).       Radiology Studies: No results found.      Scheduled Meds: . atorvastatin  80 mg Oral Daily  . diltiazem  240 mg Oral Daily  . docusate sodium  100 mg Oral BID  . FLUoxetine  40 mg Oral BID  . folic acid  1 mg Oral Daily  . hydrocortisone  25 mg Rectal BID  . insulin aspart  0-15 Units Subcutaneous TID WC  . insulin aspart  0-5 Units Subcutaneous QHS  . lidocaine  1 patch Transdermal Q24H  . metoprolol succinate  25 mg Oral Daily  . pantoprazole  40 mg Oral Daily  . polyethylene glycol   17 g Oral Daily  . witch hazel-glycerin  1 application Topical TID   Continuous Infusions: . lactated ringers 100 mL/hr at 07/30/18 0805     LOS: 3 days    Time spent: 35 minutes.     Kathlen Mody, MD [facility] Pager [phone]   If 7PM-7AM, please contact night-coverage [email or web address] Password [email or web address] 07/30/2018, 3:50 PM

## 2018-07-31 DIAGNOSIS — E785 Hyperlipidemia, unspecified: Secondary | ICD-10-CM

## 2018-07-31 LAB — GLUCOSE, CAPILLARY
GLUCOSE-CAPILLARY: 98 mg/dL (ref 70–99)
Glucose-Capillary: 98 mg/dL (ref 70–99)
Glucose-Capillary: 105 mg/dL — ABNORMAL HIGH (ref 70–99)

## 2018-07-31 LAB — CBC
HCT: 28.7 % — ABNORMAL LOW (ref 36.0–46.0)
Hemoglobin: 8.2 g/dL — ABNORMAL LOW (ref 12.0–15.0)
MCH: 22 pg — AB (ref 26.0–34.0)
MCHC: 28.6 g/dL — AB (ref 30.0–36.0)
MCV: 76.9 fL — AB (ref 78.0–100.0)
PLATELETS: 365 10*3/uL (ref 150–400)
RBC: 3.73 MIL/uL — ABNORMAL LOW (ref 3.87–5.11)
RDW: 20.6 % — AB (ref 11.5–15.5)
WBC: 9.6 10*3/uL (ref 4.0–10.5)

## 2018-07-31 MED ORDER — POLYETHYLENE GLYCOL 3350 17 G PO PACK: 17.0000 g | PACK | Freq: Every day | ORAL | 0 refills | Status: AC

## 2018-07-31 MED ORDER — HYDROCORTISONE ACETATE 25 MG RE SUPP: 25.0000 mg | Freq: Two times a day (BID) | RECTAL | 0 refills | Status: AC

## 2018-07-31 MED ORDER — POLYETHYLENE GLYCOL 3350 17 G PO PACK
17.0000 g | PACK | Freq: Two times a day (BID) | ORAL | Status: DC
  Administered 2018-07-31: 17 g via ORAL
  Filled 2018-07-31: qty 1

## 2018-07-31 MED ORDER — TRAMADOL HCL 50 MG PO TABS: 100.0000 mg | ORAL_TABLET | Freq: Four times a day (QID) | ORAL | 0 refills | Status: AC | PRN

## 2018-07-31 MED ORDER — DOCUSATE SODIUM 100 MG PO CAPS: 100.0000 mg | ORAL_CAPSULE | Freq: Two times a day (BID) | ORAL | 0 refills | Status: AC

## 2018-07-31 MED ORDER — FOLIC ACID 1 MG PO TABS: 1.0000 mg | ORAL_TABLET | Freq: Every day | ORAL | 0 refills | Status: AC

## 2018-07-31 MED ORDER — WITCH HAZEL-GLYCERIN EX PADS: 1.0000 "application " | MEDICATED_PAD | Freq: Three times a day (TID) | CUTANEOUS | 12 refills | Status: AC

## 2018-07-31 NOTE — Progress Notes (Signed)
Patient discharged to home, AVS reviewed, IV removed, prescriptions provided. Patient is waiting for her ride

## 2018-07-31 NOTE — Discharge Instructions (Signed)
[facility] _______[facility] ° °RECTAL SURGERY POST OP INSTRUCTIONS: POST OP INSTRUCTIONS ° °Always review your discharge instruction sheet given to you by the facility where your surgery was performed. °IF YOU HAVE DISABILITY OR FAMILY LEAVE FORMS, YOU MUST BRING THEM TO THE OFFICE FOR PROCESSING.   °DO NOT GIVE THEM TO YOUR DOCTOR. ° °1. A  prescription for pain medication may be given to you upon discharge.  Take your pain medication as prescribed, if needed.  If narcotic pain medicine is not needed, then you may take acetaminophen (Tylenol) or ibuprofen (Advil) as needed. °2. Take your usually prescribed medications unless otherwise directed. °3. If you need a refill on your pain medication, please contact your pharmacy.  They will contact our office to request authorization. Prescriptions will not be filled after 5 pm or on week-ends. °4. You should follow a light diet the first 48 hours after arrival home, such as soup and crackers, etc.  Be sure to include lots of fluids daily.  Resume your normal diet 2-3 days after surgery.. °5. Most patients will experience some swelling and discomfort in the rectal area. Ice packs, reclining and warm tub soaks will help.  Swelling and discomfort can take several days to resolve.  °6. It is common to experience some constipation if taking pain medication after surgery.  Increasing fluid intake and taking a stool softener (such as Colace) will usually help or prevent this problem from occurring.  A mild laxative (Milk of Magnesia or Miralax) should be taken according to package directions if there are no bowel movements after 48 hours. °7. Unless discharge instructions indicate otherwise, leave your bandage dry and in place for 24 hours, or remove the bandage if you have a bowel movement. You may notice a small amount of bleeding with bowel movements for the first few days. You may have some packing in the rectum which will come out over the first day or two. You  will need to wear an absorbent pad or soft cotton gauze in your underwear until the drainage stops.it. °8. ACTIVITIES:  You may resume regular (light) daily activities beginning the next day--such as daily self-care, walking, climbing stairs--gradually increasing activities as tolerated.  You may have sexual intercourse when it is comfortable.  Refrain from any heavy lifting or straining until approved by your doctor. °a. You may drive when you are no longer taking prescription pain medication, you can comfortably wear a seatbelt, and you can safely maneuver your car and apply brakes. °b. RETURN TO WORK: : ____________________ °c.  °9. You should see your doctor in the office for a follow-up appointment approximately 2-3 weeks after your surgery.  Make sure that you call for this appointment within a day or two after you arrive home to insure a convenient appointment time. °10. OTHER INSTRUCTIONS:  __________________________________________________________________________________________________________________________________________________________________________________________  °WHEN TO CALL YOUR DOCTOR: °1. Fever over 101.0 °2. Inability to urinate °3. Nausea and/or vomiting °4. Extreme swelling or bruising °5. Continued bleeding from rectum. °6. Increased pain, redness, or drainage from the incision °7. Constipation ° °The clinic staff is available to answer your questions during regular business hours.  Please don’t hesitate to call and ask to speak to one of the nurses for clinical concerns.  If you have a medical emergency, go to the nearest emergency room or call 911.  A surgeon from [facility] is always on call at the hospital ° ° °[address], [address], [place], [place]  [address] ? °   [id number], [place], [place]   [address] [phone] ? [phone] ? FAX [phone] Web site: [email or web address]   SITZ baths 3 times a day  Take miralax 2 - 4 times a day  for daily BM

## 2018-07-31 NOTE — Progress Notes (Signed)
2 Days Post-Op   Subjective/Chief Complaint: No further bleeding pain controlled no BM yet    Objective: Vital signs in last 24 hours: Temp:  [97.5 F (36.4 C)-98.1 F (36.7 C)] 98.1 F (36.7 C) (09/15 2015) Pulse Rate:  [82-85] 82 (09/15 2015) Resp:  [18] 18 (09/15 2015) BP: (104-105)/(64-67) 105/67 (09/15 2015) SpO2:  [97 %-99 %] 97 % (09/15 2015) Last BM Date: 07/30/18  Intake/Output from previous day: 09/15 0701 - 09/16 0700 In: 2220 [P.O.:1020; I.V.:1200] Out: 0  Intake/Output this shift: No intake/output data recorded.  Incision/Wound:anal canal intact no bleeding noted   Lab Results:  Recent Labs    07/30/18 0651 07/31/18 0519  WBC 9.8 9.6  HGB 7.4* 8.2*  HCT 25.2* 28.7*  PLT 312 365   BMET Recent Labs    07/30/18 0651  NA 137  K 4.2  CL 106  CO2 25  GLUCOSE 126*  BUN 10  CREATININE 0.65  CALCIUM 8.2*   PT/INR No results for input(s): LABPROT, INR in the last 72 hours. ABG No results for input(s): PHART, HCO3 in the last 72 hours.  Invalid input(s): PCO2, PO2  Studies/Results: No results found.  Anti-infectives: Anti-infectives (From admission, onward)   None      Assessment/Plan: s/p Procedure(s): HEMORRHOIDECTOMY (N/A) Discharge when clear medically\ Recommend BID miralax and increase as needed for BM  Sitz baths 3 times a day   LOS: 4 days    Nicole [name] A Shirley Bolle 07/31/2018

## 2018-08-04 NOTE — Discharge Summary (Signed)
Physician Discharge Summary  Nicole [name] ZOX:[id number] DOB: September 10, 1966 DOA: 07/27/2018  PCP: Verl Bangs, MD  Admit date: 07/27/2018 Discharge date: 07/31/2018  Admitted From: Home.  Disposition:  Home   Recommendations for Outpatient Follow-up:  1. Follow up with PCP in 1-2 weeks 2. Please obtain BMP/CBC in one week Please follow up with surgery as needed.   Discharge Condition: stable.  CODE STATUS: full code.  Diet recommendation: Heart Healthy  Brief/Interim Summary: Nicole [name] a 52 y.o.femalewith medical history significant ofCVA (left PICA CVA in 2/19); obesity; HTN; HLD; OSA; DM; and depression presenting with chest pain. She has been having rectal bleeding for a month. She had a screening colonoscopy in march 2018 was found to have hemorrhoids. She was found to be anemic on admission, underwent 2 units of prbc transfusion and GI consulted. she underwent flexible sigmoidoscopy was found to have internal and external hemorrhoids. Surgery consulted for further recommendations and possible hemorrhoidectomy, .  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 Atypical.  No dizziness this am. Troponin negative.  The night of 9/13 pt became symptomatic with dizziness and hypotension after 3 bloody bowel movements. Today asymptomatic.  Marland Kitchen   Symptomatic anemia/ acute anemia of blood loss From rectal bleeding from hemorrhoids,.  S/p  A total of 4 units of prbc transfusion. Repeat H&H is stable around 8.  She underwent flexible sig showing both external and internal hemorrhoids.  Surgery consulted for possible hemorrhoidectomy. She underwent hemorrhoidectomy of the  internal hemorrhoids on 9/14 Anemia panel ordered showed severe iron deficiency. Iron infusion ordered.    Hypertension:  Well controlled after the prbc transfusion.    H/o CVA;  Not on aspirin because of rectal bleed. Resume aspirin on discharge On lipitor.    DM:  CBG (last 3)  RecentLabs(last2labs)       Recent Labs    07/29/18 2114 07/30/18 0725 07/30/18 1222  GLUCAP 95 110* 93     A1c is 5. 8. No changes in  Meds. Continue with SSI.     OSA on CPAP: stable.      DVT prophylaxis   Discharge Instructions  Discharge Instructions    Diet - low sodium heart healthy   Complete by:  As directed    Discharge instructions   Complete by:  As directed    PLEASE follow up with surgery as recommended.     Allergies as of 07/31/2018      Reactions   Asa [aspirin] Other (See Comments)   Contraindication per provider - pt states not allergic, PCP stated did not go with treatment regimen, ok for pt to take medication   Hydrocodone-acetaminophen Other (See Comments)   headache   Ibuprofen Nausea Only   Morphine And Related Palpitations      Medication List    STOP taking these medications   hydrochlorothiazide 25 MG tablet Commonly known as:  HYDRODIURIL   naproxen sodium 220 MG tablet Commonly known as:  ALEVE     TAKE these medications   acetaminophen 325 MG tablet Commonly known as:  TYLENOL Take 650 mg by mouth every 6 (six) hours as needed for mild pain.   aspirin 81 MG EC tablet  Take 1 tablet (81 mg total) by mouth daily. What changed:  when to take this   atorvastatin 80 MG tablet Commonly known as:  LIPITOR Take 1 tablet (80 mg total) by mouth daily.   butalbital-acetaminophen-caffeine 50-325-40 MG tablet Commonly known as:  FIORICET, ESGIC Take 1 tablet by mouth every 6 (six) hours as needed for headache or migraine.   diltiazem 240 MG 24 hr capsule Commonly known as:  CARDIZEM CD Take 240 mg by mouth daily.    docusate sodium 100 MG capsule Commonly known as:  COLACE Take 1 capsule (100 mg total) by mouth 2 (two) times daily.   FLUoxetine 20 MG tablet Commonly known as:  PROZAC Take 2 tablets (40 mg total) by mouth 2 (two) times daily.   folic acid 1 MG tablet Commonly known as:  FOLVITE Take 1 tablet (1 mg total) by mouth daily.   furosemide 20 MG tablet Commonly known as:  LASIX Take 20 mg by mouth 2 (two) times daily.   hydrocortisone 25 MG suppository Commonly known as:  ANUSOL-HC Place 1 suppository (25 mg total) rectally 2 (two) times daily.   lidocaine 5 % Commonly known as:  LIDODERM Place 1 patch onto the skin daily. Remove & Discard patch within 12 hours or as directed by MD   meclizine 12.5 MG tablet Commonly known as:  ANTIVERT Take 1 tablet (12.5 mg total) by mouth 3 (three) times daily as needed for dizziness.   metFORMIN 500 MG tablet Commonly known as:  GLUCOPHAGE Take 1 tablet (500 mg total) by mouth 2 (two) times daily with a meal.   metoprolol succinate 25 MG 24 hr tablet Commonly known as:  TOPROL-XL Take 25 mg by mouth daily.   omeprazole 20 MG capsule Commonly known as:  PRILOSEC Take 20 mg by mouth at bedtime.   ondansetron 4 MG tablet Commonly known as:  ZOFRAN Take 4 mg by mouth every 8 (eight) hours as needed for nausea or vomiting.   OVER THE COUNTER MEDICATION CPAP   polyethylene glycol packet Commonly known as:  MIRALAX / GLYCOLAX Take 17 g by mouth daily.   potassium chloride 10 MEQ tablet Commonly known as:  K-DUR Take 10 mEq by mouth daily.   SUMAtriptan 100 MG tablet Commonly known as:  IMITREX Take 100 mg by mouth as needed.   traMADol 50 MG tablet Commonly known as:  ULTRAM Take 2 tablets (100 mg total) by mouth every 6 (six) hours as needed for moderate pain.   VENTOLIN HFA 108 (90 Base) MCG/ACT inhaler Generic drug:  albuterol Inhale 2 puffs into the lungs every 4 (four) hours as needed for wheezing.   witch  hazel-glycerin pad Commonly known as:  TUCKS Apply 1 application topically 3 (three) times daily.      Follow-up Information    Violeta Gelinas, MD. Schedule an appointment as soon as possible for a visit in 4 week(s).   Specialty:  General Surgery Contact information: [address] [place] [address] [phone]        Verl Bangs, MD. Schedule an appointment as soon as possible for a visit in 1 week(s).   Specialty:  Family Medicine Contact information: [address] [place] [address] [phone]          Allergies  Allergen Reactions  . Asa [Aspirin] Other (See Comments)    Contraindication per provider - pt states not allergic, PCP stated did not go with treatment regimen, ok for pt to  take medication  . Hydrocodone-Acetaminophen Other (See Comments)    headache  . Ibuprofen Nausea Only  . Morphine And Related Palpitations    Consultations:  Surgery  Gastroenterology.    Procedures/Studies: Dg Chest 2 View  Result Date: 07/27/2018 CLINICAL DATA:  Mid chest pain, shortness of breath EXAM: CHEST - 2 VIEW COMPARISON:  06/27/2018 FINDINGS: Heart and mediastinal contours are within normal limits. No focal opacities or effusions. No acute bony abnormality. IMPRESSION: No active cardiopulmonary disease. Electronically Signed   By: Charlett Nose M.D.   On: 07/27/2018 11:09    Hemorrhoidectomy.    Subjective: No chest pain or sob.   Discharge Exam: Vitals:   07/31/18 1038 07/31/18 1152  BP: 135/77 110/65  Pulse: 84 68  Resp:  15  Temp: 98.3 F (36.8 C) 98.1 F (36.7 C)  SpO2: 95% 94%   Vitals:   07/30/18 1623 07/30/18 2015 07/31/18 1038 07/31/18 1152  BP: 104/64 105/67 135/77 110/65  Pulse: 85 82 84 68  Resp: 18 18  15   Temp: (!) 97.5 F (36.4 C) 98.1 F (36.7 C) 98.3 F (36.8 C) 98.1 F (36.7 C)  TempSrc: Oral Oral Oral Oral  SpO2: 99% 97% 95% 94%  Weight:      Height:        General: Pt is alert, awake,  not in acute distress Cardiovascular: RRR, S1/S2 +, no rubs, no gallops Respiratory: CTA bilaterally, no wheezing, no rhonchi Abdominal: Soft, NT, ND, bowel sounds + Extremities: no edema, no cyanosis    The results of significant diagnostics from this hospitalization (including imaging, microbiology, ancillary and laboratory) are listed below for reference.     Microbiology: No results found for this or any previous visit (from the past 240 hour(s)).   Labs: BNP (last 3 results) No results for input(s): BNP in the last 8760 hours. Basic Metabolic Panel: Recent Labs  Lab 07/30/18 0651  NA 137  K 4.2  CL 106  CO2 25  GLUCOSE 126*  BUN 10  CREATININE 0.65  CALCIUM 8.2*   Liver Function Tests: No results for input(s): AST, ALT, ALKPHOS, BILITOT, PROT, ALBUMIN in the last 168 hours. No results for input(s): LIPASE, AMYLASE in the last 168 hours. No results for input(s): AMMONIA in the last 168 hours. CBC: Recent Labs  Lab 07/29/18 0239 07/29/18 1453 07/30/18 0651 07/31/18 0519  WBC 8.7  --  9.8 9.6  HGB 6.6* 7.8* 7.4* 8.2*  HCT 22.3* 26.9* 25.2* 28.7*  MCV 72.6*  --  76.6* 76.9*  PLT 356  --  312 365   Cardiac Enzymes: No results for input(s): CKTOTAL, CKMB, CKMBINDEX, TROPONINI in the last 168 hours. BNP: Invalid input(s): POCBNP CBG: Recent Labs  Lab 07/30/18 1222 07/30/18 1714 07/31/18 0752 07/31/18 1250 07/31/18 1610  GLUCAP 93 106* 98 98 105*   D-Dimer No results for input(s): DDIMER in the last 72 hours. Hgb A1c No results for input(s): HGBA1C in the last 72 hours. Lipid Profile No results for input(s): CHOL, HDL, LDLCALC, TRIG, CHOLHDL, LDLDIRECT in the last 72 hours. Thyroid function studies No results for input(s): TSH, T4TOTAL, T3FREE, THYROIDAB in the last 72 hours.  Invalid input(s): FREET3 Anemia work up No results for input(s): VITAMINB12, FOLATE, FERRITIN, TIBC, IRON, RETICCTPCT in the last 72 hours. Urinalysis    Component Value  Date/Time   COLORURINE AMBER (A) 12/18/2017 1455   APPEARANCEUR CLOUDY (A) 12/18/2017 1455   LABSPEC 1.028 12/18/2017 1455   PHURINE 6.0 12/18/2017 1455  GLUCOSEU NEGATIVE 12/18/2017 1455   HGBUR SMALL (A) 12/18/2017 1455   BILIRUBINUR NEGATIVE 12/18/2017 1455   KETONESUR NEGATIVE 12/18/2017 1455   PROTEINUR 100 (A) 12/18/2017 1455   UROBILINOGEN 1.0 06/26/2015 1708   NITRITE NEGATIVE 12/18/2017 1455   LEUKOCYTESUR NEGATIVE 12/18/2017 1455   Sepsis Labs Invalid input(s): PROCALCITONIN,  WBC,  LACTICIDVEN Microbiology No results found for this or any previous visit (from the past 240 hour(s)).   Time coordinating discharge: 35  minutes  SIGNED:   Kathlen Mody, MD  [facility] 08/04/2018, 1:34 PM Pager   If 7PM-7AM, please contact night-coverage [email or web address] Password [email or web address]

## 2018-11-01 ENCOUNTER — Emergency Department (HOSPITAL_COMMUNITY)
Admission: EM | Admit: 2018-11-01 | Discharge: 2018-11-01 | Disposition: A | Discharge status: Home / Self Care | Attending: Emergency Medicine | Admitting: Emergency Medicine

## 2018-11-01 ENCOUNTER — Other Ambulatory Visit: Payer: Self-pay

## 2018-11-01 ENCOUNTER — Encounter (HOSPITAL_COMMUNITY): Payer: Self-pay | Admitting: Emergency Medicine

## 2018-11-01 DIAGNOSIS — J45909 Unspecified asthma, uncomplicated: Secondary | ICD-10-CM | POA: Diagnosis not present

## 2018-11-01 DIAGNOSIS — M5441 Lumbago with sciatica, right side: Secondary | ICD-10-CM | POA: Insufficient documentation

## 2018-11-01 DIAGNOSIS — M5442 Lumbago with sciatica, left side: Secondary | ICD-10-CM | POA: Insufficient documentation

## 2018-11-01 DIAGNOSIS — I10 Essential (primary) hypertension: Secondary | ICD-10-CM | POA: Insufficient documentation

## 2018-11-01 DIAGNOSIS — Z7982 Long term (current) use of aspirin: Secondary | ICD-10-CM | POA: Diagnosis not present

## 2018-11-01 DIAGNOSIS — Z79899 Other long term (current) drug therapy: Secondary | ICD-10-CM | POA: Insufficient documentation

## 2018-11-01 DIAGNOSIS — Z87891 Personal history of nicotine dependence: Secondary | ICD-10-CM | POA: Diagnosis not present

## 2018-11-01 DIAGNOSIS — E119 Type 2 diabetes mellitus without complications: Secondary | ICD-10-CM | POA: Diagnosis not present

## 2018-11-01 DIAGNOSIS — Z7984 Long term (current) use of oral hypoglycemic drugs: Secondary | ICD-10-CM | POA: Diagnosis not present

## 2018-11-01 DIAGNOSIS — M545 Low back pain: Secondary | ICD-10-CM | POA: Diagnosis present

## 2018-11-01 NOTE — ED Provider Notes (Signed)
[facility] EMERGENCY DEPARTMENT Provider Note   CSN: [id number] Arrival date & time: 11/01/18  1714     History   Chief Complaint Chief Complaint  Patient presents with  . Back Pain    HPI Nicole [name] is a 52 y.o. female.  HPI  She presents for evaluation of lower back pain for 2 days, without trauma.  She recently saw her orthopedic care provider and plans on having lumbar facet injections done on 11/13/2018, by the orthopedic service.  She has been taking narcotics, without relief of her pain.  She wonders if she has a "cold in my back."  She denies fever, chills, cough, shortness of breath, change in bowel or urinary habits.  There are  known other known modifying factors.  Past Medical History:  Diagnosis Date  . Anemia   . Anxiety attack   . Asthma   . Bipolar disorder (HCC)   . Chronic lower back pain    scheduled to see pain management (07/27/2018)  . Depression   . History of blood transfusion 12/2017; 07/27/2018  . Hyperlipemia   . Hypertension   . Migraines    "5-6/year now" (07/27/2018)  . Obesity   . OSA on CPAP    inconsistent use (07/27/2018)  . Panic attacks   . Sciatica   . Stroke [facility]) 12/2017   PICA distribution; "fully recovered" (07/27/2018)  . Type II diabetes mellitus [facility])     Patient Active Problem List   Diagnosis Date Noted  . Symptomatic anemia 07/27/2018  . Hematochezia 07/27/2018  . Acute respiratory failure with hypoxia (HCC) 01/23/2018  . Migraines 01/23/2018  . 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 Obesity 01/23/2018  . Hyperlipemia 01/23/2018  . Diabetes mellitus type 2 in obese (HCC) 01/23/2018  . OSA on CPAP 01/23/2018  . Acute hypokalemia 01/23/2018  . Left ventricular diastolic dysfunction 01/23/2018  . Microcytic anemia 01/23/2018  . Hypoxia 01/23/2018  . 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 Gait disturbance,  post-stroke 01/06/2018  . Leukocytosis   . Hypokalemia   . Vascular headache   . Hyponatremia   . Acute blood loss anemia   . Essential hypertension   . 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 Cytotoxic brain edema (HCC) 12/20/2017  . Hydrocephalus (HCC) 12/20/2017  . 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 Encounter for central line placement   . Nausea vomiting and diarrhea     Past Surgical History:  Procedure Laterality Date  . CESAREAN SECTION  1988  . ENDOMETRIAL ABLATION  2017   occasional vaginal bleeding  . EYE SURGERY Left 1987   "cyst on eyelid removed"  . FLEXIBLE SIGMOIDOSCOPY N/A 07/28/2018   Procedure: FLEXIBLE SIGMOIDOSCOPY;  Surgeon: Kathi Der, MD;  Location: [facility] ENDOSCOPY;  Service: Gastroenterology;  Laterality: N/A;  . HEMORRHOID SURGERY N/A 07/29/2018   Procedure: HEMORRHOIDECTOMY;  Surgeon: Violeta Gelinas, MD;  Location: [facility] OR;  Service: General;  Laterality: N/A;  . TUBAL LIGATION  1988     OB History   No obstetric history on file.      Home Medications    Prior to Admission medications   Medication Sig Start Date End Date Taking? Authorizing Provider  acetaminophen (TYLENOL) 325 MG tablet Take 650 mg by mouth every 6 (six) hours as needed for mild pain.    [provider]  albuterol (VENTOLIN HFA) 108 (90 Base) MCG/ACT inhaler Inhale  2 puffs into the lungs every 4 (four) hours as needed for wheezing. 03/21/17   [provider]  aspirin EC 81 MG EC tablet Take 1 tablet (81 mg total) by mouth daily. Patient taking differently: Take 81 mg by mouth at bedtime.  12/28/17   Angiulli, Mcarthur Rossetti, PA-C  atorvastatin (LIPITOR) 80 MG tablet Take 1 tablet (80 mg total) by mouth daily. 12/28/17   Angiulli, Mcarthur Rossetti, PA-C  butalbital-acetaminophen-caffeine (FIORICET, ESGIC) [phone] MG tablet Take 1 tablet by mouth every 6 (six) hours as needed for headache or migraine. 12/28/17   Angiulli, Mcarthur Rossetti, PA-C   diltiazem (CARDIZEM CD) 240 MG 24 hr capsule Take 240 mg by mouth daily. 07/15/18   [provider]  docusate sodium (COLACE) 100 MG capsule Take 1 capsule (100 mg total) by mouth 2 (two) times daily. 07/31/18   Kathlen Mody, MD  FLUoxetine (PROZAC) 20 MG tablet Take 2 tablets (40 mg total) by mouth 2 (two) times daily. 12/28/17   Angiulli, Mcarthur Rossetti, PA-C  folic acid (FOLVITE) 1 MG tablet Take 1 tablet (1 mg total) by mouth daily. 07/31/18   Kathlen Mody, MD  furosemide (LASIX) 20 MG tablet Take 20 mg by mouth 2 (two) times daily. 06/24/18   [provider]  hydrocortisone (ANUSOL-HC) 25 MG suppository Place 1 suppository (25 mg total) rectally 2 (two) times daily. 07/31/18   Kathlen Mody, MD  lidocaine (LIDODERM) 5 % Place 1 patch onto the skin daily. Remove & Discard patch within 12 hours or as directed by MD 10/25/17   Anselm Pancoast, PA-C  meclizine (ANTIVERT) 12.5 MG tablet Take 1 tablet (12.5 mg total) by mouth 3 (three) times daily as needed for dizziness. 12/28/17   Angiulli, Mcarthur Rossetti, PA-C  metFORMIN (GLUCOPHAGE) 500 MG tablet Take 1 tablet (500 mg total) by mouth 2 (two) times daily with a meal. 12/28/17 12/28/18  Angiulli, Mcarthur Rossetti, PA-C  metoprolol succinate (TOPROL-XL) 25 MG 24 hr tablet Take 25 mg by mouth daily. 06/24/18   [provider]  omeprazole (PRILOSEC) 20 MG capsule Take 20 mg by mouth at bedtime.    [provider]  ondansetron (ZOFRAN) 4 MG tablet Take 4 mg by mouth every 8 (eight) hours as needed for nausea or vomiting.    [provider]  OVER THE COUNTER MEDICATION CPAP    [provider]  polyethylene glycol (MIRALAX / GLYCOLAX) packet Take 17 g by mouth daily. 07/31/18   Kathlen Mody, MD  potassium chloride (K-DUR) 10 MEQ tablet Take 10 mEq by mouth daily. 06/01/18   [provider]  SUMAtriptan (IMITREX) 100 MG tablet Take 100 mg by mouth as needed. 06/01/18   [provider]  traMADol (ULTRAM) 50 MG tablet  Take 2 tablets (100 mg total) by mouth every 6 (six) hours as needed for moderate pain. 07/31/18   Kathlen Mody, MD  witch hazel-glycerin (TUCKS) pad Apply 1 application topically 3 (three) times daily. 07/31/18   Kathlen Mody, MD    Family History Family History  Problem Relation Age of Onset  . Hypertension Mother   . Hypertension Father     Social History Social History   Tobacco Use  . Smoking status: Former Games developer  . Smokeless tobacco: Never Used  . Tobacco comment: 07/27/2018 "stopped in 2011"  Substance Use Topics  . Alcohol use: Not Currently  . Drug use: Never     Allergies   Asa [aspirin]; Hydrocodone-acetaminophen; Ibuprofen; and Morphine and related  Review of Systems Review of Systems  All other systems reviewed and are negative.    Physical Exam Updated Vital Signs There were no vitals taken for this visit.  Physical Exam Vitals signs and nursing note reviewed.  Constitutional:      Appearance: She is well-developed.  HENT:     Head: Normocephalic and atraumatic.     Right Ear: External ear normal.     Left Ear: External ear normal.  Eyes:     Conjunctiva/sclera: Conjunctivae normal.     Pupils: Pupils are equal, round, and reactive to light.  Neck:     Musculoskeletal: Normal range of motion.     Trachea: Phonation normal.  Cardiovascular:     Rate and Rhythm: Normal rate.  Pulmonary:     Effort: Pulmonary effort is normal.  Skin:    General: Skin is warm and dry.  Neurological:     Mental Status: She is alert and oriented to person, place, and time.     Cranial Nerves: No cranial nerve deficit.     Sensory: No sensory deficit.     Motor: No abnormal muscle tone.     Coordination: Coordination normal.  Psychiatric:        Behavior: Behavior normal.        Thought Content: Thought content normal.        Judgment: Judgment normal.      ED Treatments / Results  Labs (all labs ordered are listed, but only abnormal results are  displayed) Labs Reviewed - No data to display  EKG None  Radiology No results found.  Procedures Procedures (including critical care time)  Medications Ordered in ED Medications - No data to display   Initial Impression / Assessment and Plan / ED Course  I have reviewed the triage vital signs and the nursing notes.  Pertinent labs & imaging results that were available during my care of the patient were reviewed by me and considered in my medical decision making (see chart for details).      No data found.  6:04 PM Reevaluation with update and discussion. After initial assessment and treatment, an updated evaluation reveals no change in clinical status.  Findings discussed with the patient and all questions were answered. Mancel Bale   Medical Decision Making: Ongoing low back pain without findings or history for acute abnormality.  Suspect nerve impingement as source of her discomfort.  She has short-term follow-up plan for facet injections.  CRITICAL CARE-no Performed by: Mancel Bale  Nursing Notes Reviewed/ Care Coordinated Applicable Imaging Reviewed Interpretation of Laboratory Data incorporated into ED treatment  The patient appears reasonably screened and/or stabilized for discharge and I doubt any other medical condition or other [facility] requiring further screening, evaluation, or treatment in the ED at this time prior to discharge.  Plan: Home Medications-continue usual medications; Home Treatments-rest, heat; return here if the recommended treatment, does not improve the symptoms; Recommended follow up-follow-up with orthopedics and PCP, as needed, and as scheduled.   Final Clinical Impressions(s) / ED Diagnoses   Final diagnoses:  None    ED Discharge Orders    None       Mancel Bale, MD 11/01/18 1805

## 2018-11-01 NOTE — Discharge Instructions (Signed)
Your back pain is likely from the chronic problem with nerve impingement.  Follow-up with your doctor as scheduled for outpatient treatment, on 11/13/2018.  Below is his contact information:  Eliseo Squires, MD  Pain Medicine  [address] [place]  [place] [address]     Phone: [phone]  Fax: [phone]

## 2018-11-01 NOTE — ED Triage Notes (Signed)
Pt reports lower back pain x 2 days. Pt also reports bilateral knee pain. Pt reports she is scheduled to have surgery to lower back on 12/30. Pt reports using lidocaine patches and muscle relaxer with minimal relief.

## 2018-12-07 IMAGING — CT CT HEAD W/O CM
4 series · 15 of 47 positions shown, 17 images · non-contrast
Comparison: Prior CT from 12/21/2017.

CLINICAL DATA: Follow-up examination for stroke with hydrocephalus.

EXAM:
CT HEAD WITHOUT CONTRAST
TECHNIQUE: Contiguous axial images were obtained from the base of the skull
through the vertex without intravenous contrast.

[Series 3: head without · axial · non-contrast · 0.44mm/px · z∈[-119,+1]mm · 7 of 34 slices shown, 9 images]
[im 5/34  brain]
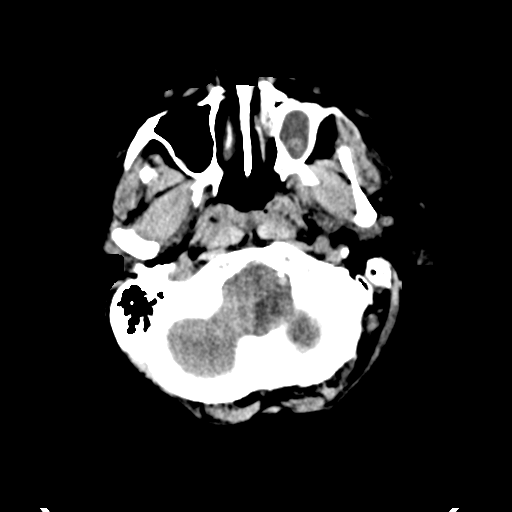
[im 5/34  bone]
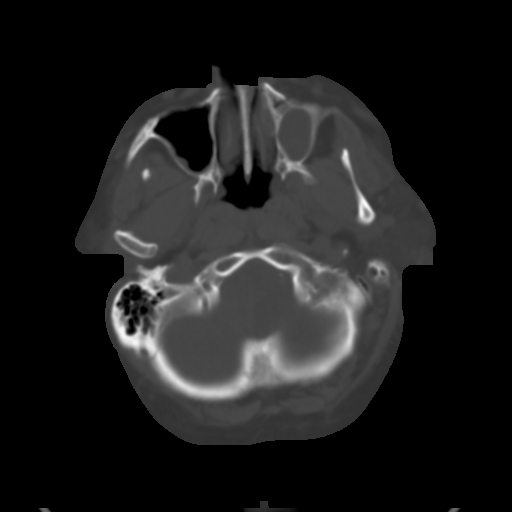
[im 9/34  brain]
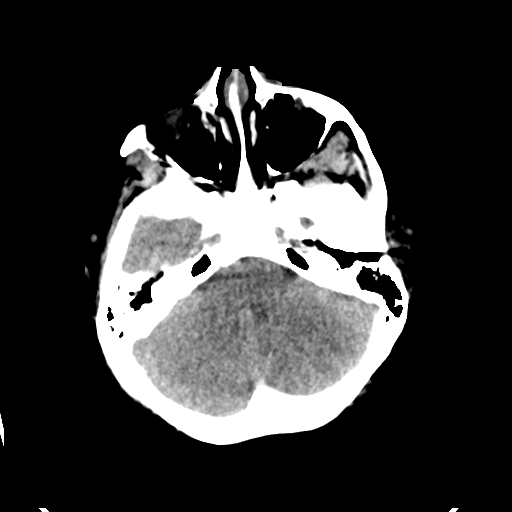
[im 13/34  brain]
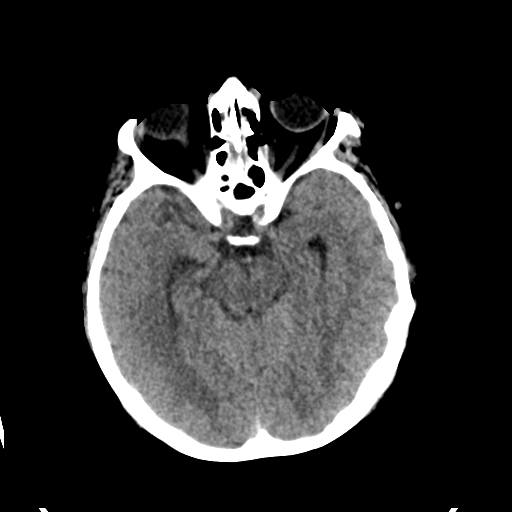
[im 17/34  brain]
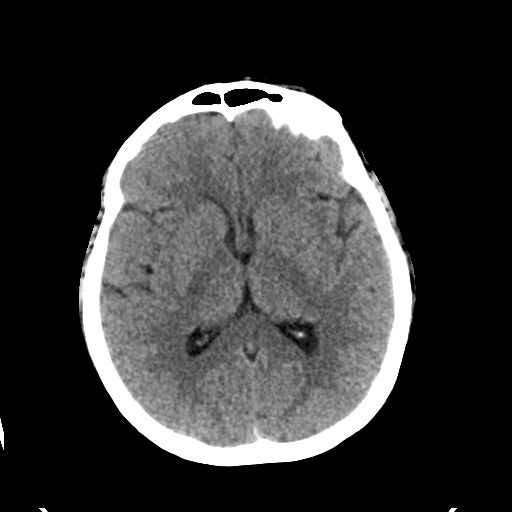
[im 21/34  brain]
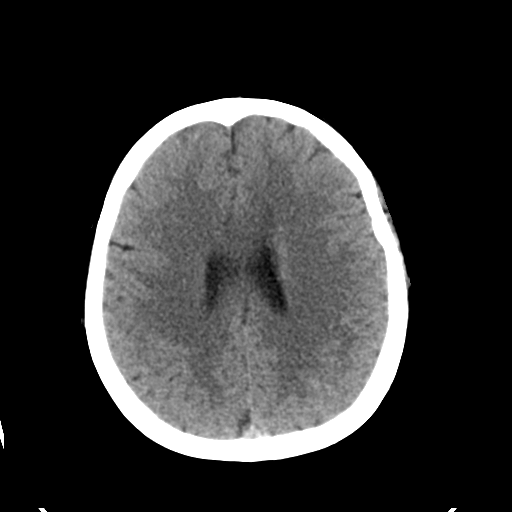
[im 21/34  bone]
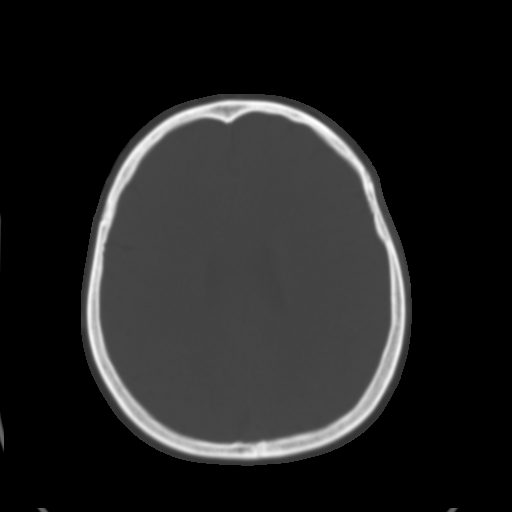
[im 25/34  brain]
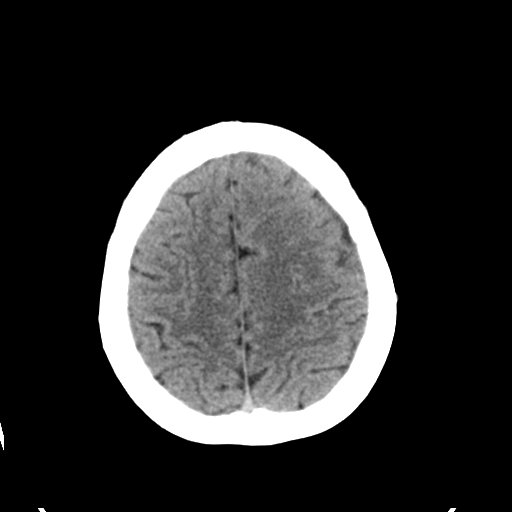
[im 29/34  brain]
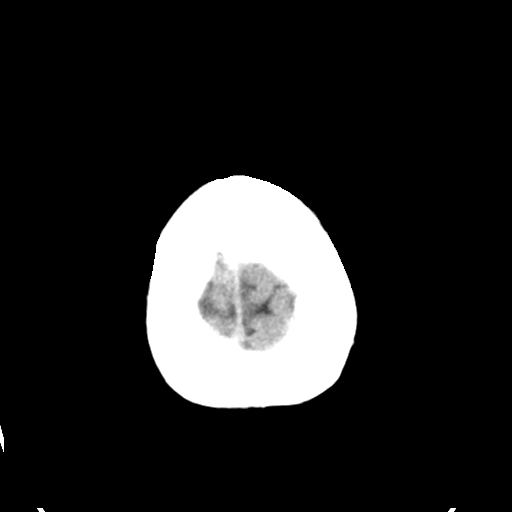

[Series 4: head bone · axial · 0.44mm/px · z∈[-123,-107]mm · 2 of 85 slices shown]
[im 9/85  bone]
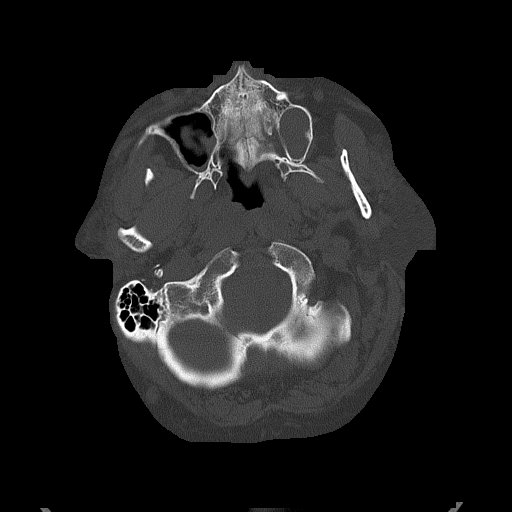
[im 17/85  bone]
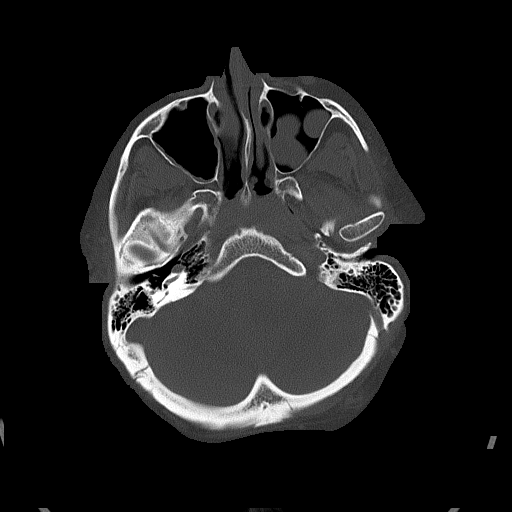

[Series 5: head without cor · coronal · non-contrast · 0.33mm/px · 3 of 75 slices shown]
[im 25/75  brain]
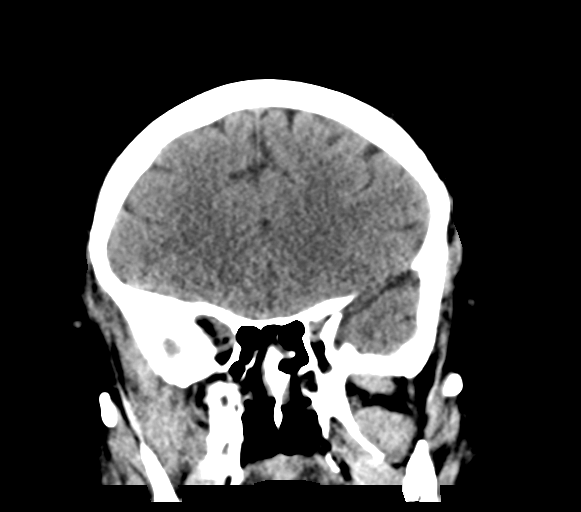
[im 33/75  brain]
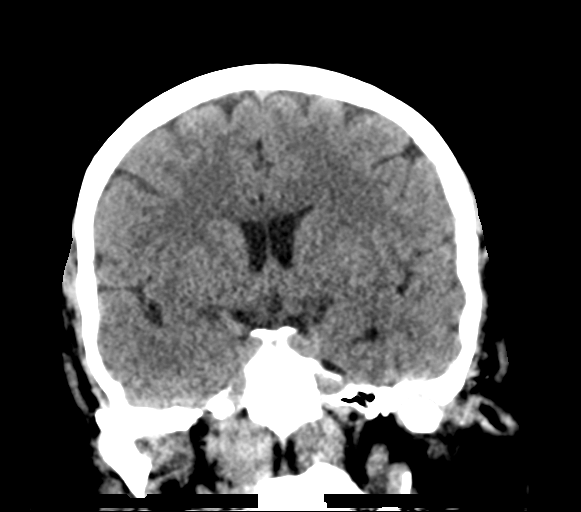
[im 42/75  brain]
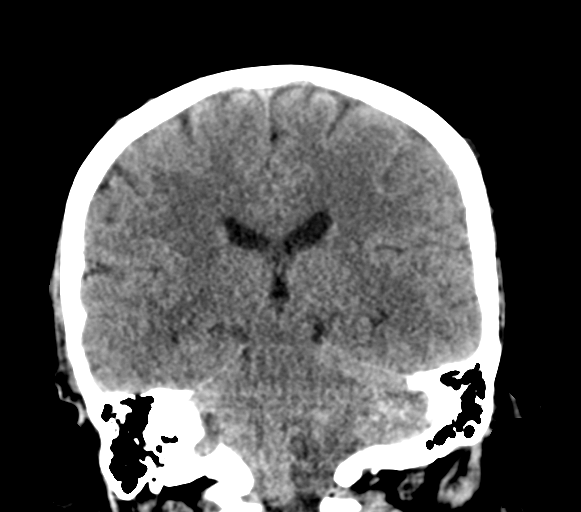

[Series 6: head without sag · sagittal · non-contrast · 0.33mm/px · 3 of 67 slices shown]
[im 23/67  brain]
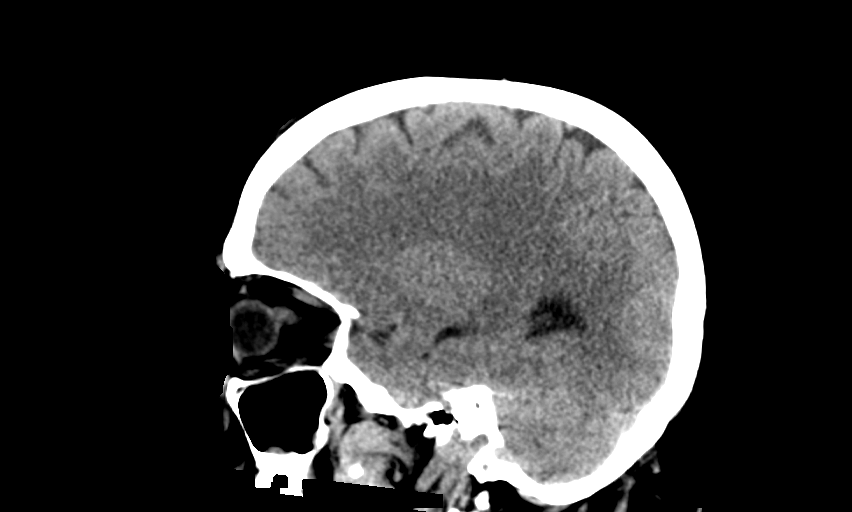
[im 34/67  brain]
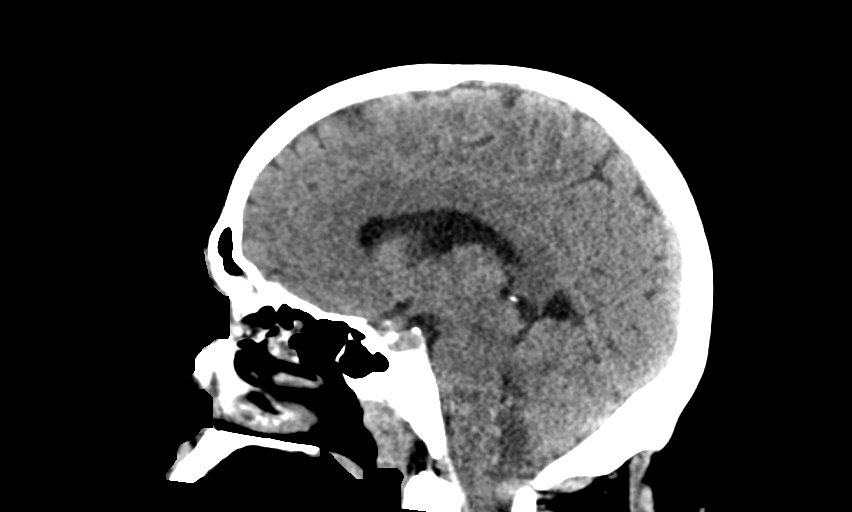
[im 45/67  brain]
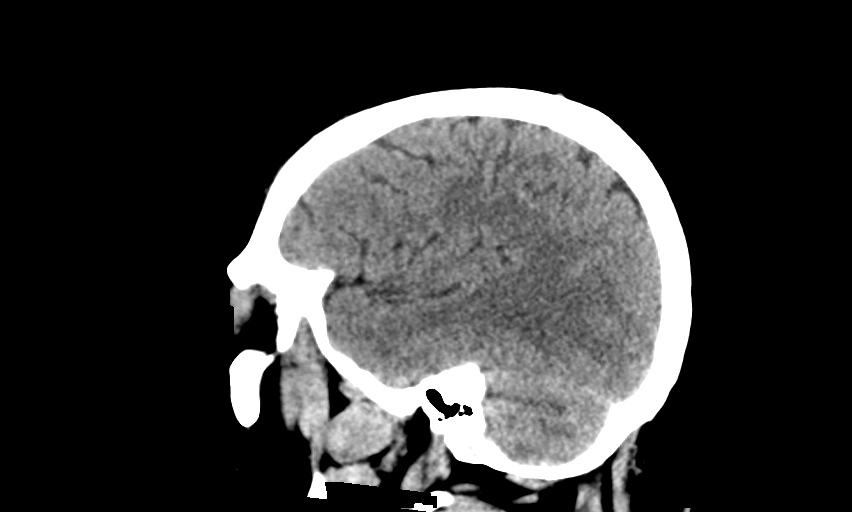

[15 of 47 positions shown; findings below may reference images not displayed]

FINDINGS: Brain: Continued interval evolution in bilateral cerebellar
infarcts, left larger than right no evidence for hemorrhagic
transformation. Associated mass effect within the posterior fossa
with secondary partial effacement of the fourth ventricle and
quadrigeminal plate cistern, greater on the left. Mild mass effect
on the adjacent brainstem again noted. Associated inferior tonsillar
herniation noted. Slightly decreased ventricular dilatation of the
lateral and third ventricles as compared to previous, most evident
at the temporal horns.

No new intracranial infarct. No acute intracranial hemorrhage. No
mass lesion or midline shift. No extra-axial fluid collection.

Vascular: Calcified atherosclerosis at the skull base. No worrisome
hyperdense vessel.

Skull: Scalp soft tissues and calvarium are unchanged and within
normal limits.

Sinuses/Orbits: Globes orbital soft tissues within normal limits.
Scattered paranasal sinus disease similar to previous. Trace opacity
within the right mastoid air cells noted.

Other: None.
IMPRESSION: 1. Continued interval evolution of left larger than right bilateral
cerebellar infarcts with associated mass effect and inferior
tonsillar herniation. Associated mild obstructive hydrocephalus is
slightly improved from previous exam. No evidence for hemorrhagic
transformation.
2. No other new acute intracranial abnormality.

## 2019-03-09 ENCOUNTER — Emergency Department (HOSPITAL_COMMUNITY)
Admission: EM | Admit: 2019-03-09 | Discharge: 2019-03-09 | Disposition: A | Discharge status: Home / Self Care | Attending: Emergency Medicine | Admitting: Emergency Medicine

## 2019-03-09 ENCOUNTER — Emergency Department (HOSPITAL_COMMUNITY)

## 2019-03-09 ENCOUNTER — Encounter (HOSPITAL_COMMUNITY): Payer: Self-pay | Admitting: Emergency Medicine

## 2019-03-09 ENCOUNTER — Other Ambulatory Visit: Payer: Self-pay

## 2019-03-09 DIAGNOSIS — Z7982 Long term (current) use of aspirin: Secondary | ICD-10-CM | POA: Diagnosis not present

## 2019-03-09 DIAGNOSIS — R0602 Shortness of breath: Secondary | ICD-10-CM | POA: Insufficient documentation

## 2019-03-09 DIAGNOSIS — R0789 Other chest pain: Secondary | ICD-10-CM | POA: Diagnosis present

## 2019-03-09 DIAGNOSIS — Z8709 Personal history of other diseases of the respiratory system: Secondary | ICD-10-CM | POA: Diagnosis not present

## 2019-03-09 DIAGNOSIS — I10 Essential (primary) hypertension: Secondary | ICD-10-CM | POA: Diagnosis not present

## 2019-03-09 DIAGNOSIS — R110 Nausea: Secondary | ICD-10-CM | POA: Diagnosis not present

## 2019-03-09 DIAGNOSIS — Z87891 Personal history of nicotine dependence: Secondary | ICD-10-CM | POA: Insufficient documentation

## 2019-03-09 DIAGNOSIS — E119 Type 2 diabetes mellitus without complications: Secondary | ICD-10-CM | POA: Diagnosis not present

## 2019-03-09 DIAGNOSIS — R609 Edema, unspecified: Secondary | ICD-10-CM | POA: Insufficient documentation

## 2019-03-09 DIAGNOSIS — Z79899 Other long term (current) drug therapy: Secondary | ICD-10-CM | POA: Insufficient documentation

## 2019-03-09 DIAGNOSIS — F319 Bipolar disorder, unspecified: Secondary | ICD-10-CM | POA: Diagnosis not present

## 2019-03-09 DIAGNOSIS — Z7984 Long term (current) use of oral hypoglycemic drugs: Secondary | ICD-10-CM | POA: Insufficient documentation

## 2019-03-09 DIAGNOSIS — R079 Chest pain, unspecified: Secondary | ICD-10-CM

## 2019-03-09 LAB — CBC WITH DIFFERENTIAL/PLATELET
Abs Immature Granulocytes: 0.03 10*3/uL (ref 0.00–0.07)
Basophils Absolute: 0 10*3/uL (ref 0.0–0.1)
Basophils Relative: 1 %
Eosinophils Absolute: 0 10*3/uL (ref 0.0–0.5)
Eosinophils Relative: 0 %
HCT: 36.8 % (ref 36.0–46.0)
Hemoglobin: 12.1 g/dL (ref 12.0–15.0)
Immature Granulocytes: 0 %
Lymphocytes Relative: 17 %
Lymphs Abs: 1.5 10*3/uL (ref 0.7–4.0)
MCH: 29.4 pg (ref 26.0–34.0)
MCHC: 32.9 g/dL (ref 30.0–36.0)
MCV: 89.3 fL (ref 80.0–100.0)
Monocytes Absolute: 0.6 10*3/uL (ref 0.1–1.0)
Monocytes Relative: 8 %
Neutro Abs: 6.4 10*3/uL (ref 1.7–7.7)
Neutrophils Relative %: 74 %
Platelets: 314 10*3/uL (ref 150–400)
RBC: 4.12 MIL/uL (ref 3.87–5.11)
RDW: 12.5 % (ref 11.5–15.5)
WBC: 8.6 10*3/uL (ref 4.0–10.5)
nRBC: 0 % (ref 0.0–0.2)

## 2019-03-09 LAB — COMPREHENSIVE METABOLIC PANEL
ALT: 16 U/L (ref 0–44)
AST: 15 U/L (ref 15–41)
Albumin: 3.6 g/dL (ref 3.5–5.0)
Alkaline Phosphatase: 63 U/L (ref 38–126)
Anion gap: 14 (ref 5–15)
BUN: 11 mg/dL (ref 6–20)
CO2: 23 mmol/L (ref 22–32)
Calcium: 9.5 mg/dL (ref 8.9–10.3)
Chloride: 103 mmol/L (ref 98–111)
Creatinine, Ser: 0.69 mg/dL (ref 0.44–1.00)
GFR calc Af Amer: >60 mL/min (ref >60)
GFR calc non Af Amer: >60 mL/min (ref >60)
Glucose, Bld: 192 mg/dL — ABNORMAL HIGH (ref 70–99)
Potassium: 3.5 mmol/L (ref 3.5–5.1)
Sodium: 140 mmol/L (ref 135–145)
Total Bilirubin: 0.4 mg/dL (ref 0.3–1.2)
Total Protein: 7.3 g/dL (ref 6.5–8.1)

## 2019-03-09 LAB — LIPASE, BLOOD: Lipase: 26 U/L (ref 11–51)

## 2019-03-09 LAB — TROPONIN I: Troponin I: <0.03 ng/mL (ref <0.03)

## 2019-03-09 MED ORDER — ONDANSETRON HCL 4 MG/2ML IJ SOLN: 4.0000 mg | Freq: Once | INTRAMUSCULAR | Status: DC

## 2019-03-09 MED ORDER — ASPIRIN 81 MG PO CHEW: 324.0000 mg | CHEWABLE_TABLET | Freq: Once | ORAL | Status: DC

## 2019-03-09 MED ORDER — FENTANYL CITRATE (PF) 100 MCG/2ML IJ SOLN: 50.0000 ug | Freq: Once | INTRAMUSCULAR | Status: DC

## 2019-03-09 NOTE — ED Provider Notes (Addendum)
Pt with CP for 3 days -  Has neg trop On exam clear lungs, HR of 85,  Otherwise stable for d/c.  Pt agreeable - will refer to cards as outpt - has no exertaionl CP and has had neg stress in past. Over 72 hours of continuous symptoms without relief, negative troponin  Medical screening examination/treatment/procedure(s) were conducted as a shared visit with non-physician practitioner(s) and myself.  I personally evaluated the patient during the encounter.  Clinical Impression: .  Final diagnoses:  Chest pain, unspecified type     EKG Interpretation  Date/Time:  Friday March 09 2019 13:19:18 EDT Ventricular Rate:  90 PR Interval:  226 QRS Duration: 94 QT Interval:  366 QTC Calculation: 447 R Axis:   52 Text Interpretation:  Sinus rhythm with 1st degree A-V block Incomplete right bundle branch block Cannot rule out Anterior infarct , age undetermined Abnormal ECG since last tracing no significant change Confirmed by Eber Hong ([id number]) on 03/09/2019 1:56:13 PM         Eber Hong, MD 03/09/19 1606    Eber Hong, MD 03/10/19 [phone]

## 2019-03-09 NOTE — ED Triage Notes (Signed)
Pt reports CP and SOb x 3 days. Denies any fevers or cough

## 2019-03-09 NOTE — ED Notes (Signed)
SOB and edema to edema to x 4 extremities.  Admits does sleep with multiple pillows.  3-4 day hx of worsening sx.  Uses cpap at HS with no relief of dyspnea.  Currently admits to difficulty catching her breath.  Alert and oriented skin P/W/D

## 2019-03-09 NOTE — ED Notes (Signed)
Pt O2 sats noted to be 87% while sleeping. Pt endorses having sleep apnea. Pt placed on 2L O2 via nasal cannula. sats improved.

## 2019-03-09 NOTE — ED Provider Notes (Signed)
[facility] EMERGENCY DEPARTMENT Provider Note   CSN: [id number] Arrival date & time: 03/09/19  1310    History   Chief Complaint Chief Complaint  Patient presents with  . Chest Pain    HPI Nicole [name] is a 53 y.o. female with a hx of HTN, hyperlipidemia, T2DM, OSA on CPAP, obesity, stroke, asthma, and bipolar disorder who presents to the emergency department with a complaint of chest discomfort over the past 3 days.  Patient describes sensation of chest heaviness/pressure located to the left chest radiating to the left shoulder/upper extremity.  She states the discomfort is constant, at times she has associated dyspnea, nausea, and lightheadedness. Pain is not exertional. She feels generally fatigued doing things which is not new..  No pleuritic type pain.  She has tried using her albuterol inhaler and nebulizer machines without much change.  This does not feel like her asthma.  Denies syncope, diaphoresis, emesis, syncope, calf pain/swelling, hemoptysis, recent surgery/trauma, recent long travel, hormone use, personal hx of cancer, or hx of DVT/PE. Patient had a stress test in 2018 which she was told was normal.    HPI  Past Medical History:  Diagnosis Date  . Anemia   . Anxiety attack   . Asthma   . Bipolar disorder (HCC)   . Chronic lower back pain    scheduled to see pain management (07/27/2018)  . Depression   . History of blood transfusion 12/2017; 07/27/2018  . Hyperlipemia   . Hypertension   . Migraines    "5-6/year now" (07/27/2018)  . Obesity   . OSA on CPAP    inconsistent use (07/27/2018)  . Panic attacks   . Sciatica   . Stroke [facility]) 12/2017   PICA distribution; "fully recovered" (07/27/2018)  . Type II diabetes mellitus [facility])     Patient Active Problem List   Diagnosis Date Noted  . Symptomatic anemia 07/27/2018  . Hematochezia 07/27/2018  . Acute respiratory failure with hypoxia (HCC) 01/23/2018  . Migraines 01/23/2018  . 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 Obesity 01/23/2018  . Hyperlipemia 01/23/2018  . Diabetes mellitus type 2 in obese (HCC) 01/23/2018  . OSA on CPAP 01/23/2018  . Acute hypokalemia 01/23/2018  . Left ventricular diastolic dysfunction 01/23/2018  . Microcytic anemia 01/23/2018  . Hypoxia 01/23/2018  . 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 Gait disturbance, post-stroke 01/06/2018  . Leukocytosis   . Hypokalemia   . Vascular headache   . Hyponatremia   . Acute blood loss anemia   . Essential hypertension   . 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 Cytotoxic brain edema (HCC) 12/20/2017  . Hydrocephalus (HCC) 12/20/2017  . 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 Encounter for central line placement   . Nausea vomiting and diarrhea     Past Surgical History:  Procedure Laterality Date  . CESAREAN SECTION  1988  . ENDOMETRIAL ABLATION  2017   occasional vaginal bleeding  . EYE SURGERY Left 1987   "cyst on eyelid removed"  . FLEXIBLE SIGMOIDOSCOPY N/A 07/28/2018   Procedure: FLEXIBLE SIGMOIDOSCOPY;  Surgeon: Kathi Der, MD;  Location: [facility] ENDOSCOPY;  Service: Gastroenterology;  Laterality: N/A;  . HEMORRHOID SURGERY N/A 07/29/2018   Procedure: HEMORRHOIDECTOMY;  Surgeon: Violeta Gelinas, MD;  Location: [facility] OR;  Service: General;  Laterality: N/A;  . TUBAL LIGATION  1988     OB History   No obstetric  history on file.    Home Medications    Prior to Admission medications   Medication Sig Start Date End Date Taking? Authorizing Provider  acetaminophen (TYLENOL) 325 MG tablet Take 650 mg by mouth every 6 (six) hours as needed for mild pain.    [provider]  albuterol (VENTOLIN HFA) 108 (90 Base) MCG/ACT inhaler Inhale 2 puffs into the lungs every 4 (four) hours as needed for wheezing. 03/21/17   [provider]  aspirin EC 81 MG EC tablet Take 1  tablet (81 mg total) by mouth daily. Patient taking differently: Take 81 mg by mouth at bedtime.  12/28/17   Angiulli, Mcarthur Rossettianiel J, PA-C  atorvastatin (LIPITOR) 80 MG tablet Take 1 tablet (80 mg total) by mouth daily. 12/28/17   Angiulli, Mcarthur Rossettianiel J, PA-C  butalbital-acetaminophen-caffeine (FIORICET, ESGIC) [id number] MG tablet Take 1 tablet by mouth every 6 (six) hours as needed for headache or migraine. 12/28/17   Angiulli, Mcarthur Rossettianiel J, PA-C  diltiazem (CARDIZEM CD) 240 MG 24 hr capsule Take 240 mg by mouth daily. 07/15/18   [provider]  docusate sodium (COLACE) 100 MG capsule Take 1 capsule (100 mg total) by mouth 2 (two) times daily. 07/31/18   Kathlen ModyAkula, Vijaya, MD  FLUoxetine (PROZAC) 20 MG tablet Take 2 tablets (40 mg total) by mouth 2 (two) times daily. 12/28/17   Angiulli, Mcarthur Rossettianiel J, PA-C  folic acid (FOLVITE) 1 MG tablet Take 1 tablet (1 mg total) by mouth daily. 07/31/18   Kathlen ModyAkula, Vijaya, MD  furosemide (LASIX) 20 MG tablet Take 20 mg by mouth 2 (two) times daily. 06/24/18   [provider]  hydrocortisone (ANUSOL-HC) 25 MG suppository Place 1 suppository (25 mg total) rectally 2 (two) times daily. 07/31/18   Kathlen ModyAkula, Vijaya, MD  lidocaine (LIDODERM) 5 % Place 1 patch onto the skin daily. Remove & Discard patch within 12 hours or as directed by MD 10/25/17   Anselm PancoastJoy, Shawn C, PA-C  meclizine (ANTIVERT) 12.5 MG tablet Take 1 tablet (12.5 mg total) by mouth 3 (three) times daily as needed for dizziness. 12/28/17   Angiulli, Mcarthur Rossettianiel J, PA-C  metFORMIN (GLUCOPHAGE) 500 MG tablet Take 1 tablet (500 mg total) by mouth 2 (two) times daily with a meal. 12/28/17 12/28/18  Angiulli, Mcarthur Rossettianiel J, PA-C  metoprolol succinate (TOPROL-XL) 25 MG 24 hr tablet Take 25 mg by mouth daily. 06/24/18   [provider]  omeprazole (PRILOSEC) 20 MG capsule Take 20 mg by mouth at bedtime.    [provider]  ondansetron (ZOFRAN) 4 MG tablet Take 4 mg by mouth every 8 (eight) hours as needed for nausea or vomiting.     [provider]  OVER THE COUNTER MEDICATION CPAP    [provider]  polyethylene glycol (MIRALAX / GLYCOLAX) packet Take 17 g by mouth daily. 07/31/18   Kathlen ModyAkula, Vijaya, MD  potassium chloride (K-DUR) 10 MEQ tablet Take 10 mEq by mouth daily. 06/01/18   [provider]  SUMAtriptan (IMITREX) 100 MG tablet Take 100 mg by mouth as needed. 06/01/18   [provider]  traMADol (ULTRAM) 50 MG tablet Take 2 tablets (100 mg total) by mouth every 6 (six) hours as needed for moderate pain. 07/31/18   Kathlen ModyAkula, Vijaya, MD  witch hazel-glycerin (TUCKS) pad Apply 1 application topically 3 (three) times daily. 07/31/18   Kathlen ModyAkula, Vijaya, MD    Family History Family History  Problem Relation Age of Onset  . Hypertension Mother   . Hypertension Father  Social History Social History   Tobacco Use  . Smoking status: Former Games developer  . Smokeless tobacco: Never Used  . Tobacco comment: 07/27/2018 "stopped in 2011"  Substance Use Topics  . Alcohol use: Not Currently  . Drug use: Never     Allergies   Asa [aspirin]; Hydrocodone-acetaminophen; Ibuprofen; and Morphine and related   Review of Systems Review of Systems  Constitutional: Negative for chills and fever.  Respiratory: Positive for shortness of breath.   Cardiovascular: Positive for chest pain. Negative for palpitations and leg swelling.  Gastrointestinal: Positive for nausea. Negative for abdominal pain, blood in stool, constipation, diarrhea and vomiting.  Genitourinary: Negative for dysuria.  All other systems reviewed and are negative.  Physical Exam Updated Vital Signs BP 104/68 (BP Location: Right Arm)   Pulse 92   Temp 98.1 F (36.7 C) (Oral)   Resp (!) 22   SpO2 97%   Physical Exam Vitals signs and nursing note reviewed.  Constitutional:      General: She is not in acute distress.    Appearance: She is well-developed. She is not toxic-appearing.  HENT:     Head: Normocephalic and  atraumatic.  Eyes:     General:        Right eye: No discharge.        Left eye: No discharge.     Conjunctiva/sclera: Conjunctivae normal.  Neck:     Musculoskeletal: Neck supple.  Cardiovascular:     Rate and Rhythm: Normal rate and regular rhythm.     Pulses:          Radial pulses are 2+ on the right side and 2+ on the left side.       Dorsalis pedis pulses are 2+ on the right side and 2+ on the left side.  Pulmonary:     Effort: Pulmonary effort is normal. No respiratory distress.     Breath sounds: Normal breath sounds. No wheezing, rhonchi or rales.  Abdominal:     General: There is no distension.     Palpations: Abdomen is soft.     Tenderness: There is no abdominal tenderness. There is no guarding or rebound.  Musculoskeletal:     Comments: 1+ edema noted to the lower legs, symmetric, no overlying erythema/warmth, no calf tenderness.   Skin:    General: Skin is warm and dry.     Findings: No rash.  Neurological:     Mental Status: She is alert.     Comments: Clear speech.   Psychiatric:        Behavior: Behavior normal.    ED Treatments / Results  Labs (all labs ordered are listed, but only abnormal results are displayed) Labs Reviewed  COMPREHENSIVE METABOLIC PANEL - Abnormal; Notable for the following components:      Result Value   Glucose, Bld 192 (*)    All other components within normal limits  CBC WITH DIFFERENTIAL/PLATELET  LIPASE, BLOOD  TROPONIN I    EKG EKG Interpretation  Date/Time:  Friday March 09 2019 13:19:18 EDT Ventricular Rate:  90 PR Interval:  226 QRS Duration: 94 QT Interval:  366 QTC Calculation: 447 R Axis:   52 Text Interpretation:  Sinus rhythm with 1st degree A-V block Incomplete right bundle branch block Cannot rule out Anterior infarct , age undetermined Abnormal ECG since last tracing no significant change Confirmed by Eber Hong ([id number]) on 03/09/2019 1:56:13 PM   Radiology Dg Chest 2 View  Result Date: 03/09/2019  CLINICAL DATA:  Shortness of breath. EXAM: CHEST - 2 VIEW COMPARISON:  Radiographs of July 27, 2018. FINDINGS: The heart size and mediastinal contours are within normal limits. Both lungs are clear. No pneumothorax or pleural effusion is noted. The visualized skeletal structures are unremarkable. IMPRESSION: No active cardiopulmonary disease. Electronically Signed   By: Lupita Raider M.D.   On: 03/09/2019 14:11    Procedures Procedures (including critical care time)  Medications Ordered in ED Medications - No data to display   Initial Impression / Assessment and Plan / ED Course  I have reviewed the triage vital signs and the nursing notes.  Pertinent labs & imaging results that were available during my care of the patient were reviewed by me and considered in my medical decision making (see chart for details).    Patient presents to the emergency department with chest pain. Patient nontoxic appearing, in no apparent distress, vitals without significant abnormality. Fairly benign physical exam. DDX: ACS, pulmonary embolism, dissection, pneumothorax, infiltrate, CHF, anemia, electrolyte derangement, MSK, GERD, anxiety, obesity related. Evaluation initiated with labs, EKG, and CXR. Patient on cardiac monitor.   Work-up in the ER reviewed:  CBC: no anemia or leukocytosis BMP: Hyperglycemia @ 192, no anion gap elevation or acidosis. No electrolyte derangement.  Troponin: WNL EKG: No significant change.  CXR:  Negative, without infiltrate, effusion, pneumothorax, or fracture/dislocation.   14:25: Patient's SpO2 desaturated to 88% while she was sleeping, she has a hx of sleep apnea on OSA, RN placed oxygen via [place] w/ improvement. Upon entering the room nasal cannula not properly in patients nares. I turned her oxygen off. She is awake, maintaining SPO2 -100% on RA without increased respiratory effort, unclear if this was valid reading or poor pleth.   Patient w/ reported normal stress  test in 2018, unable to visualize report through care everywhere, but no further cardiology intervention documented. EKG without significant change from prior, trop negative after persistent pain for days, doubt ACS. Patient is low risk wells, doubt pulmonary embolism. Pain is not a tearing sensation, symmetric pulses, no widening of mediastinum on CXR, doubt dissection. Cardiac monitor reviewed, no notable arrhythmias or tachycardia. Patient has appeared hemodynamically stable throughout ER visit and appears safe for discharge with close cardiology follow up. I discussed results, treatment plan, need for follow-up, and return precautions with the patient. Provided opportunity for questions, patient confirmed understanding and is in agreement with plan.   This is a shared visit with supervising physician Dr. Hyacinth Meeker who has independently evaluated patient & provided guidance in evaluation/management/disposition, in agreement with care   Final Clinical Impressions(s) / ED Diagnoses   Final diagnoses:  Chest pain, unspecified type    ED Discharge Orders    None       Cherly Anderson, PA-C 03/09/19 1626    Eber Hong, MD 03/10/19 [phone]

## 2019-03-09 NOTE — ED Notes (Signed)
Patient verbalizes understanding of discharge instructions. Opportunity for questioning and answers were provided. Armband removed by staff, pt discharged from ED.  

## 2019-03-09 NOTE — Discharge Instructions (Addendum)
You were seen in the emergency department today for chest pain. Your work-up in the emergency department has been overall reassuring. Your labs have been fairly normal and or similar to previous blood work you have had done. Your EKG and the enzyme we use to check your heart did not show an acute heart attack at this time. Your chest x-ray was normal.  ° °We would like you to follow up closely with your primary care provider and/or the cardiologist provided in your discharge instructions within 1-3 days. Return to the ER immediately should you experience any new or worsening symptoms including but not limited to return of pain, worsened pain, vomiting, shortness of breath, dizziness, lightheadedness, passing out, or any other concerns that you may have.   °

## 2019-03-25 ENCOUNTER — Emergency Department (HOSPITAL_COMMUNITY)
Admission: EM | Admit: 2019-03-25 | Discharge: 2019-03-25 | Disposition: A | Discharge status: Home / Self Care | Attending: Emergency Medicine | Admitting: Emergency Medicine

## 2019-03-25 ENCOUNTER — Encounter (HOSPITAL_COMMUNITY): Payer: Self-pay | Admitting: Emergency Medicine

## 2019-03-25 ENCOUNTER — Emergency Department (HOSPITAL_COMMUNITY)

## 2019-03-25 ENCOUNTER — Other Ambulatory Visit: Payer: Self-pay

## 2019-03-25 DIAGNOSIS — I10 Essential (primary) hypertension: Secondary | ICD-10-CM | POA: Diagnosis not present

## 2019-03-25 DIAGNOSIS — Z7982 Long term (current) use of aspirin: Secondary | ICD-10-CM | POA: Insufficient documentation

## 2019-03-25 DIAGNOSIS — E876 Hypokalemia: Secondary | ICD-10-CM | POA: Diagnosis not present

## 2019-03-25 DIAGNOSIS — Z7984 Long term (current) use of oral hypoglycemic drugs: Secondary | ICD-10-CM | POA: Diagnosis not present

## 2019-03-25 DIAGNOSIS — E119 Type 2 diabetes mellitus without complications: Secondary | ICD-10-CM | POA: Insufficient documentation

## 2019-03-25 DIAGNOSIS — Z87891 Personal history of nicotine dependence: Secondary | ICD-10-CM | POA: Diagnosis not present

## 2019-03-25 DIAGNOSIS — R079 Chest pain, unspecified: Secondary | ICD-10-CM | POA: Diagnosis present

## 2019-03-25 DIAGNOSIS — J45909 Unspecified asthma, uncomplicated: Secondary | ICD-10-CM | POA: Diagnosis not present

## 2019-03-25 DIAGNOSIS — Z79899 Other long term (current) drug therapy: Secondary | ICD-10-CM | POA: Insufficient documentation

## 2019-03-25 DIAGNOSIS — Z8673 Personal history of transient ischemic attack (TIA), and cerebral infarction without residual deficits: Secondary | ICD-10-CM | POA: Insufficient documentation

## 2019-03-25 DIAGNOSIS — R0789 Other chest pain: Secondary | ICD-10-CM | POA: Diagnosis not present

## 2019-03-25 LAB — COMPREHENSIVE METABOLIC PANEL
ALT: 16 U/L (ref 0–44)
AST: 15 U/L (ref 15–41)
Albumin: 3.9 g/dL (ref 3.5–5.0)
Alkaline Phosphatase: 79 U/L (ref 38–126)
Anion gap: 16 — ABNORMAL HIGH (ref 5–15)
BUN: 15 mg/dL (ref 6–20)
CO2: 26 mmol/L (ref 22–32)
Calcium: 9.3 mg/dL (ref 8.9–10.3)
Chloride: 94 mmol/L — ABNORMAL LOW (ref 98–111)
Creatinine, Ser: 0.78 mg/dL (ref 0.44–1.00)
GFR calc Af Amer: >60 mL/min (ref >60)
GFR calc non Af Amer: >60 mL/min (ref >60)
Glucose, Bld: 156 mg/dL — ABNORMAL HIGH (ref 70–99)
Potassium: 2.8 mmol/L — ABNORMAL LOW (ref 3.5–5.1)
Sodium: 136 mmol/L (ref 135–145)
Total Bilirubin: 0.8 mg/dL (ref 0.3–1.2)
Total Protein: 7.8 g/dL (ref 6.5–8.1)

## 2019-03-25 LAB — TROPONIN I: Troponin I: <0.03 ng/mL (ref <0.03)

## 2019-03-25 LAB — CBC
HCT: 40 % (ref 36.0–46.0)
Hemoglobin: 13.3 g/dL (ref 12.0–15.0)
MCH: 29 pg (ref 26.0–34.0)
MCHC: 33.3 g/dL (ref 30.0–36.0)
MCV: 87.1 fL (ref 80.0–100.0)
Platelets: 328 10*3/uL (ref 150–400)
RBC: 4.59 MIL/uL (ref 3.87–5.11)
RDW: 12.3 % (ref 11.5–15.5)
WBC: 11 10*3/uL — ABNORMAL HIGH (ref 4.0–10.5)
nRBC: 0 % (ref 0.0–0.2)

## 2019-03-25 MED ORDER — POTASSIUM CHLORIDE 20 MEQ/15ML (10%) PO SOLN
60.0000 meq | Freq: Once | ORAL | Status: AC
  Administered 2019-03-25: 60 meq via ORAL
  Filled 2019-03-25: qty 45

## 2019-03-25 MED ORDER — POTASSIUM CHLORIDE CRYS ER 20 MEQ PO TBCR: 20.0000 meq | EXTENDED_RELEASE_TABLET | Freq: Two times a day (BID) | ORAL | 0 refills | Status: AC

## 2019-03-25 NOTE — ED Notes (Signed)
Discharge instructions and prescription discussed with Pt. Pt verbalized understanding. Pt stable and ambulatory.    

## 2019-03-25 NOTE — Discharge Instructions (Addendum)
Your potassium level is low today.  You are given potassium in the emergency department and 3 days worth.  Please call your primary care doctor tomorrow for recheck.

## 2019-03-25 NOTE — ED Triage Notes (Signed)
Pt reports central CP that radiates to right shoulder since yesterday, reports it is worse with inspiration.

## 2019-03-25 NOTE — ED Provider Notes (Signed)
[facility] EMERGENCY DEPARTMENT Provider Note   CSN: [id number] Arrival date & time: 03/25/19  1316    History   Chief Complaint Chief Complaint  Patient presents with  . Chest Pain    HPI Nicole [name] is a 53 y.o. female.     HPI 53 yo female co chest pain began yesterday am sharp and in the central chest.  Worsens with deep breathing and palpation.  She has had no known injury.  She denies nasal congestion, fever, or chills.  She states that she has had some cough.  She sleeps at night with the BiPAP machine.  She was seen here several weeks ago for similar symptoms and advised to follow-up with cardiology but states that she forgot.  He has not taken anything has improved this.  She has not done anything has worsened besides deep breathing.  She denies any lateralized leg swelling.  Endorse that she has had some diffuse myalgias.  Denies taking any medication to improve this.  She states pain has been 7 out of 10 at the worst and is currently 2-3 out of 10  Past Medical History:  Diagnosis Date  . Anemia   . Anxiety attack   . Asthma   . Bipolar disorder (HCC)   . Chronic lower back pain    scheduled to see pain management (07/27/2018)  . Depression   . History of blood transfusion 12/2017; 07/27/2018  . Hyperlipemia   . Hypertension   . Migraines    "5-6/year now" (07/27/2018)  . Obesity   . OSA on CPAP    inconsistent use (07/27/2018)  . Panic attacks   . Sciatica   . Stroke [facility]) 12/2017   PICA distribution; "fully recovered" (07/27/2018)  . Type II diabetes mellitus [facility])     Patient Active Problem List   Diagnosis Date Noted  . Symptomatic anemia 07/27/2018  . Hematochezia 07/27/2018  . Acute respiratory failure with hypoxia (HCC) 01/23/2018  . Migraines 01/23/2018  . 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 Obesity 01/23/2018  . Hyperlipemia 01/23/2018  . Diabetes  mellitus type 2 in obese (HCC) 01/23/2018  . OSA on CPAP 01/23/2018  . Acute hypokalemia 01/23/2018  . Left ventricular diastolic dysfunction 01/23/2018  . Microcytic anemia 01/23/2018  . Hypoxia 01/23/2018  . 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 Gait disturbance, post-stroke 01/06/2018  . Leukocytosis   . Hypokalemia   . Vascular headache   . Hyponatremia   . Acute blood loss anemia   . Essential hypertension   . 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 Cytotoxic brain edema (HCC) 12/20/2017  . Hydrocephalus (HCC) 12/20/2017  . 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 Encounter for central line placement   . Nausea vomiting and diarrhea     Past Surgical History:  Procedure Laterality Date  . CESAREAN SECTION  1988  . ENDOMETRIAL ABLATION  2017   occasional vaginal bleeding  . EYE SURGERY Left 1987   "cyst on eyelid removed"  . FLEXIBLE SIGMOIDOSCOPY N/A 07/28/2018   Procedure: FLEXIBLE SIGMOIDOSCOPY;  Surgeon: Kathi Der, MD;  Location: [facility] ENDOSCOPY;  Service: Gastroenterology;  Laterality: N/A;  . HEMORRHOID SURGERY N/A 07/29/2018   Procedure: HEMORRHOIDECTOMY;  Surgeon: Violeta Gelinas, MD;  Location: [facility] OR;  Service: General;  Laterality: N/A;  . TUBAL LIGATION  1988     OB History   No obstetric history on file.  Home Medications    Prior to Admission medications   Medication Sig Start Date End Date Taking? Authorizing Provider  acetaminophen (TYLENOL) 325 MG tablet Take 650 mg by mouth every 6 (six) hours as needed for mild pain.    [provider]  albuterol (VENTOLIN HFA) 108 (90 Base) MCG/ACT inhaler Inhale 2 puffs into the lungs every 4 (four) hours as needed for wheezing. 03/21/17   [provider]  aspirin EC 81 MG EC tablet Take 1 tablet (81 mg total) by mouth daily. Patient taking differently: Take 81 mg by mouth at bedtime.  12/28/17   Angiulli, Mcarthur Rossetti, PA-C  atorvastatin (LIPITOR) 80  MG tablet Take 1 tablet (80 mg total) by mouth daily. 12/28/17   Angiulli, Mcarthur Rossetti, PA-C  butalbital-acetaminophen-caffeine (FIORICET, ESGIC) [phone] MG tablet Take 1 tablet by mouth every 6 (six) hours as needed for headache or migraine. 12/28/17   Angiulli, Mcarthur Rossetti, PA-C  diltiazem (CARDIZEM CD) 240 MG 24 hr capsule Take 240 mg by mouth daily. 07/15/18   [provider]  docusate sodium (COLACE) 100 MG capsule Take 1 capsule (100 mg total) by mouth 2 (two) times daily. 07/31/18   Kathlen Mody, MD  FLUoxetine (PROZAC) 20 MG tablet Take 2 tablets (40 mg total) by mouth 2 (two) times daily. 12/28/17   Angiulli, Mcarthur Rossetti, PA-C  folic acid (FOLVITE) 1 MG tablet Take 1 tablet (1 mg total) by mouth daily. 07/31/18   Kathlen Mody, MD  furosemide (LASIX) 20 MG tablet Take 20 mg by mouth 2 (two) times daily. 06/24/18   [provider]  hydrocortisone (ANUSOL-HC) 25 MG suppository Place 1 suppository (25 mg total) rectally 2 (two) times daily. 07/31/18   Kathlen Mody, MD  lidocaine (LIDODERM) 5 % Place 1 patch onto the skin daily. Remove & Discard patch within 12 hours or as directed by MD 10/25/17   Anselm Pancoast, PA-C  meclizine (ANTIVERT) 12.5 MG tablet Take 1 tablet (12.5 mg total) by mouth 3 (three) times daily as needed for dizziness. 12/28/17   Angiulli, Mcarthur Rossetti, PA-C  metFORMIN (GLUCOPHAGE) 500 MG tablet Take 1 tablet (500 mg total) by mouth 2 (two) times daily with a meal. 12/28/17 12/28/18  Angiulli, Mcarthur Rossetti, PA-C  metoprolol succinate (TOPROL-XL) 25 MG 24 hr tablet Take 25 mg by mouth daily. 06/24/18   [provider]  omeprazole (PRILOSEC) 20 MG capsule Take 20 mg by mouth at bedtime.    [provider]  ondansetron (ZOFRAN) 4 MG tablet Take 4 mg by mouth every 8 (eight) hours as needed for nausea or vomiting.    [provider]  OVER THE COUNTER MEDICATION CPAP    [provider]  polyethylene glycol (MIRALAX / GLYCOLAX) packet Take 17 g by mouth  daily. 07/31/18   Kathlen Mody, MD  potassium chloride (K-DUR) 10 MEQ tablet Take 10 mEq by mouth daily. 06/01/18   [provider]  SUMAtriptan (IMITREX) 100 MG tablet Take 100 mg by mouth as needed. 06/01/18   [provider]  traMADol (ULTRAM) 50 MG tablet Take 2 tablets (100 mg total) by mouth every 6 (six) hours as needed for moderate pain. 07/31/18   Kathlen Mody, MD  witch hazel-glycerin (TUCKS) pad Apply 1 application topically 3 (three) times daily. 07/31/18   Kathlen Mody, MD    Family History Family History  Problem Relation Age of Onset  . Hypertension Mother   . Hypertension Father     Social History Social  History   Tobacco Use  . Smoking status: Former Games developer  . Smokeless tobacco: Never Used  . Tobacco comment: 07/27/2018 "stopped in 2011"  Substance Use Topics  . Alcohol use: Not Currently  . Drug use: Never     Allergies   Asa [aspirin]; Hydrocodone-acetaminophen; Ibuprofen; and Morphine and related   Review of Systems Review of Systems  All other systems reviewed and are negative.    Physical Exam Updated Vital Signs Temp 99.1 F (37.3 C) (Oral)   Physical Exam Vitals signs and nursing note reviewed.  Constitutional:      General: She is not in acute distress.    Appearance: She is well-developed. She is obese. She is not ill-appearing.  HENT:     Head: Normocephalic and atraumatic.  Eyes:     Extraocular Movements: Extraocular movements intact.     Pupils: Pupils are equal, round, and reactive to light.  Neck:     Musculoskeletal: Normal range of motion and neck supple.  Cardiovascular:     Rate and Rhythm: Normal rate and regular rhythm.     Heart sounds: Normal heart sounds.  Pulmonary:     Effort: Pulmonary effort is normal.     Breath sounds: Normal breath sounds.  Chest:     Chest wall: Tenderness present.     Comments: Chest pain is reproduced by palpating on the anterior upper chest Abdominal:     General: Bowel  sounds are normal.     Palpations: Abdomen is soft.  Musculoskeletal: Normal range of motion.  Skin:    General: Skin is warm and dry.     Capillary Refill: Capillary refill takes less than 2 seconds.  Neurological:     General: No focal deficit present.     Mental Status: She is alert. She is disoriented.  Psychiatric:        Mood and Affect: Mood normal.        Behavior: Behavior normal.      ED Treatments / Results  Labs (all labs ordered are listed, but only abnormal results are displayed) Labs Reviewed  CBC  COMPREHENSIVE METABOLIC PANEL  TROPONIN I    EKG EKG Interpretation  Date/Time:  Sunday Mar 25 2019 13:25:52 EDT Ventricular Rate:  97 PR Interval:    QRS Duration: 102 QT Interval:  362 QTC Calculation: 460 R Axis:   54 Text Interpretation:  Normal sinus rhythm Poor R wave progression No significant change since last tracing 09 March 2019 Confirmed by ,  ([id number]) on 03/25/2019 1:55:15 PM   Radiology Dg Chest Port 1 View  Result Date: 03/25/2019 CLINICAL DATA:  Central chest pain EXAM: PORTABLE CHEST 1 VIEW COMPARISON:  03/09/2019 FINDINGS: Cardiomegaly. Both lungs are clear. The visualized skeletal structures are unremarkable. IMPRESSION: Cardiomegaly without acute abnormality of the lungs in AP portable projection. Electronically Signed   By: Alex  Bibbey M.D.   On: 03/25/2019 14:08    Procedures Procedures (including critical care time)  Medications Ordered in ED Medications - No data to display   Initial Impression / Assessment and Plan / ED Course  I have reviewed the triage vital signs and the nursing notes.  Pertinent labs & imaging results that were available during my care of the patient were reviewed by me and considered in my medical decision making (see chart for details).      53  year old female with multiple health problems including diabetes Hypertension, and history of cerebral infarct presents today with anterior chest  pain.  Pain is very reproducible on exam.  She was seen several days ago.  It also has been present for greater than 24 hours constantly.  EKG shows no evidence of acute ischemia and troponin is negative.  I have a low index of suspicion for coronary artery disease given the type of pain that she has.  Chest x-Shaletha Humble is clear.  Feel this is likely chest wall pain.  She is hypokalemic and was given 60 mEq here.  She is not taking any potassium at home.  She will be given 2 days of potassium supplementation.  We discussed that she needs to call her primary care doctor and have this rechecked this week.  She voices understanding of need for follow-up and return precautions.  Final Clinical Impressions(s) / ED Diagnoses   Final diagnoses:  None    ED Discharge Orders    None       Margarita Grizzleay, Chelsea Nusz, MD 03/25/19 [phone]

## 2019-09-07 ENCOUNTER — Encounter (HOSPITAL_COMMUNITY): Payer: Self-pay

## 2019-09-07 ENCOUNTER — Emergency Department (HOSPITAL_COMMUNITY)
Admission: EM | Admit: 2019-09-07 | Discharge: 2019-09-07 | Disposition: A | Discharge status: Home / Self Care | Attending: Emergency Medicine | Admitting: Emergency Medicine

## 2019-09-07 ENCOUNTER — Other Ambulatory Visit: Payer: Self-pay

## 2019-09-07 DIAGNOSIS — Z7984 Long term (current) use of oral hypoglycemic drugs: Secondary | ICD-10-CM | POA: Diagnosis not present

## 2019-09-07 DIAGNOSIS — Z8673 Personal history of transient ischemic attack (TIA), and cerebral infarction without residual deficits: Secondary | ICD-10-CM | POA: Diagnosis not present

## 2019-09-07 DIAGNOSIS — I10 Essential (primary) hypertension: Secondary | ICD-10-CM | POA: Insufficient documentation

## 2019-09-07 DIAGNOSIS — Z87891 Personal history of nicotine dependence: Secondary | ICD-10-CM | POA: Insufficient documentation

## 2019-09-07 DIAGNOSIS — M545 Low back pain, unspecified: Secondary | ICD-10-CM

## 2019-09-07 DIAGNOSIS — Z7982 Long term (current) use of aspirin: Secondary | ICD-10-CM | POA: Diagnosis not present

## 2019-09-07 DIAGNOSIS — Z79899 Other long term (current) drug therapy: Secondary | ICD-10-CM | POA: Insufficient documentation

## 2019-09-07 DIAGNOSIS — J45909 Unspecified asthma, uncomplicated: Secondary | ICD-10-CM | POA: Diagnosis not present

## 2019-09-07 DIAGNOSIS — E119 Type 2 diabetes mellitus without complications: Secondary | ICD-10-CM | POA: Diagnosis not present

## 2019-09-07 MED ORDER — CYCLOBENZAPRINE HCL 10 MG PO TABS
10.0000 mg | ORAL_TABLET | Freq: Once | ORAL | Status: AC
  Administered 2019-09-07: 10 mg via ORAL
  Filled 2019-09-07: qty 1

## 2019-09-07 MED ORDER — OXYCODONE-ACETAMINOPHEN 7.5-325 MG PO TABS: 1.0000 | ORAL_TABLET | ORAL | 0 refills | Status: DC | PRN

## 2019-09-07 MED ORDER — KETOROLAC TROMETHAMINE 30 MG/ML IJ SOLN
30.0000 mg | Freq: Once | INTRAMUSCULAR | Status: AC
  Administered 2019-09-07: 30 mg via INTRAMUSCULAR
  Filled 2019-09-07: qty 1

## 2019-09-07 MED ORDER — FENTANYL CITRATE (PF) 100 MCG/2ML IJ SOLN
50.0000 ug | Freq: Once | INTRAMUSCULAR | Status: AC
  Administered 2019-09-07: 50 ug via NASAL
  Filled 2019-09-07: qty 2

## 2019-09-07 MED ORDER — OXYCODONE-ACETAMINOPHEN 5-325 MG PO TABS
2.0000 | ORAL_TABLET | Freq: Once | ORAL | Status: AC
  Administered 2019-09-07: 2 via ORAL
  Filled 2019-09-07: qty 2

## 2019-09-07 NOTE — Discharge Instructions (Signed)
It was my pleasure taking care of you today!   You have been seen in the Emergency Department today for back pain.   I have given you a prescription for 5 of your percocet pain pills. Please use sparingly as these will have to get you through until your appointment with your doctor next week.    Return to the ED for worsening back pain, fever, weakness or numbness of either leg, or if you develop either (1) an inability to urinate or have bowel movements, or (2) loss of your ability to control your bathroom functions (if you start having "accidents"), or if you develop other new symptoms that concern you.

## 2019-09-07 NOTE — ED Notes (Signed)
Pt also has halter monitor.

## 2019-09-07 NOTE — ED Triage Notes (Signed)
Pt c/o back pain worsening in the last week that she likens to contractions. Pt states that she is followed by Dr Francesco Runner for this, but the pain is unbearable.

## 2019-09-07 NOTE — ED Provider Notes (Signed)
[facility]-EMERGENCY DEPT Provider Note   CSN: [id number] Arrival date & time: 09/07/19  1333     History   Chief Complaint Chief Complaint  Patient presents with  . Back Pain    HPI Nicole [name] is a 53 y.o. female.     The history is provided by the patient and medical records. No language interpreter was used.  Back Pain Associated symptoms: no abdominal pain, no fever, no numbness and no weakness    Nicole [name] is a 53 y.o. female  with a PMH as listed below including chronic low back pain who presents to the Emergency Department complaining of acute worsening of her chronic left-sided back pain.  Followed by multiple specialists.  Has tried multiple over-the-counter medications, baclofen and Epson salt baths.  She typically takes oxycodone, but is out of this medication.  She states that she was up for a prescription really soon and has her follow-up appointment either Monday or Tuesday.  Patient denies upper back or neck pain. No fever, saddle anesthesia, weakness, numbness, urinary complaints including retention/incontinence. No history of cancer, IVDU, or recent spinal procedures.  Past Medical History:  Diagnosis Date  . Anemia   . Anxiety attack   . Asthma   . Bipolar disorder (HCC)   . Chronic lower back pain    scheduled to see pain management (07/27/2018)  . Depression   . History of blood transfusion 12/2017; 07/27/2018  . Hyperlipemia   . Hypertension   . Migraines    "5-6/year now" (07/27/2018)  . Obesity   . OSA on CPAP    inconsistent use (07/27/2018)  . Panic attacks   . Sciatica   . Stroke [facility](HCC) 12/2017   PICA distribution; "fully recovered" (07/27/2018)  . Type II diabetes mellitus [facility](HCC)     Patient Active Problem List   Diagnosis Date Noted  . Symptomatic anemia 07/27/2018  . Hematochezia 07/27/2018  . Acute respiratory failure with hypoxia (HCC) 01/23/2018  . Migraines 01/23/2018  . History of CVA  (cerebrovascular accident)-Left PICA embolic w/ tonsillar herniation Dec 19, 2017 01/23/2018  . Obesity 01/23/2018  . Hyperlipemia 01/23/2018  . Diabetes mellitus type 2 in obese (HCC) 01/23/2018  . OSA on CPAP 01/23/2018  . Acute hypokalemia 01/23/2018  . Left ventricular diastolic dysfunction 01/23/2018  . Microcytic anemia 01/23/2018  . Hypoxia 01/23/2018  . 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 Gait disturbance, post-stroke 01/06/2018  . Leukocytosis   . Hypokalemia   . Vascular headache   . Hyponatremia   . Acute blood loss anemia   . Essential hypertension   . 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 Cytotoxic brain edema (HCC) 12/20/2017  . Hydrocephalus (HCC) 12/20/2017  . 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 Encounter for central line placement   . Nausea vomiting and diarrhea     Past Surgical History:  Procedure Laterality Date  . CESAREAN SECTION  1988  . ENDOMETRIAL ABLATION  2017   occasional vaginal bleeding  . EYE SURGERY Left 1987   "cyst on eyelid removed"  . FLEXIBLE SIGMOIDOSCOPY N/A 07/28/2018   Procedure: FLEXIBLE SIGMOIDOSCOPY;  Surgeon: Kathi DerBrahmbhatt, Parag, MD;  Location: [facility] ENDOSCOPY;  Service: Gastroenterology;  Laterality: N/A;  . HEMORRHOID SURGERY N/A 07/29/2018   Procedure: HEMORRHOIDECTOMY;  Surgeon: Violeta Gelinashompson, Burke, MD;  [facility];  Service: General;  Laterality: N/A;  . TUBAL LIGATION  1988     OB History   No obstetric history on  file.      Home Medications    Prior to Admission medications   Medication Sig Start Date End Date Taking? Authorizing Provider  acetaminophen (TYLENOL) 325 MG tablet Take 650 mg by mouth every 6 (six) hours as needed for mild pain.    [provider]  albuterol (VENTOLIN HFA) 108 (90 Base) MCG/ACT inhaler Inhale 2 puffs into the lungs every 4 (four) hours as needed for wheezing. 03/21/17   [provider]  aspirin EC 81 MG EC tablet Take 1  tablet (81 mg total) by mouth daily. Patient taking differently: Take 81 mg by mouth at bedtime.  12/28/17   Angiulli, Mcarthur Rossetti, PA-C  atorvastatin (LIPITOR) 80 MG tablet Take 1 tablet (80 mg total) by mouth daily. 12/28/17   Angiulli, Mcarthur Rossetti, PA-C  butalbital-acetaminophen-caffeine (FIORICET, ESGIC) [phone] MG tablet Take 1 tablet by mouth every 6 (six) hours as needed for headache or migraine. 12/28/17   Angiulli, Mcarthur Rossetti, PA-C  diltiazem (CARDIZEM CD) 240 MG 24 hr capsule Take 240 mg by mouth daily. 07/15/18   [provider]  docusate sodium (COLACE) 100 MG capsule Take 1 capsule (100 mg total) by mouth 2 (two) times daily. 07/31/18   Kathlen Mody, MD  FLUoxetine (PROZAC) 20 MG tablet Take 2 tablets (40 mg total) by mouth 2 (two) times daily. 12/28/17   Angiulli, Mcarthur Rossetti, PA-C  folic acid (FOLVITE) 1 MG tablet Take 1 tablet (1 mg total) by mouth daily. 07/31/18   Kathlen Mody, MD  furosemide (LASIX) 20 MG tablet Take 20 mg by mouth 2 (two) times daily. 06/24/18   [provider]  hydrocortisone (ANUSOL-HC) 25 MG suppository Place 1 suppository (25 mg total) rectally 2 (two) times daily. 07/31/18   Kathlen Mody, MD  lidocaine (LIDODERM) 5 % Place 1 patch onto the skin daily. Remove & Discard patch within 12 hours or as directed by MD 10/25/17   Anselm Pancoast, PA-C  meclizine (ANTIVERT) 12.5 MG tablet Take 1 tablet (12.5 mg total) by mouth 3 (three) times daily as needed for dizziness. 12/28/17   Angiulli, Mcarthur Rossetti, PA-C  metFORMIN (GLUCOPHAGE) 500 MG tablet Take 1 tablet (500 mg total) by mouth 2 (two) times daily with a meal. 12/28/17 12/28/18  Angiulli, Mcarthur Rossetti, PA-C  metoprolol succinate (TOPROL-XL) 25 MG 24 hr tablet Take 25 mg by mouth daily. 06/24/18   [provider]  omeprazole (PRILOSEC) 20 MG capsule Take 20 mg by mouth at bedtime.    [provider]  ondansetron (ZOFRAN) 4 MG tablet Take 4 mg by mouth every 8 (eight) hours as needed for nausea or vomiting.     [provider]  OVER THE COUNTER MEDICATION CPAP    [provider]  oxyCODONE-acetaminophen (PERCOCET) 7.5-325 MG tablet Take 1 tablet by mouth every 4 (four) hours as needed for severe pain. 09/07/19   Kenae Lindquist, Chase Picket, PA-C  polyethylene glycol (MIRALAX / GLYCOLAX) packet Take 17 g by mouth daily. 07/31/18   Kathlen Mody, MD  potassium chloride (K-DUR) 10 MEQ tablet Take 10 mEq by mouth daily. 06/01/18   [provider]  potassium chloride SA (K-DUR) 20 MEQ tablet Take 1 tablet (20 mEq total) by mouth 2 (two) times daily. 03/25/19   Margarita Grizzle, MD  SUMAtriptan (IMITREX) 100 MG tablet Take 100 mg by mouth as needed. 06/01/18   [provider]  traMADol (ULTRAM) 50 MG tablet Take 2 tablets (100 mg total) by mouth every 6 (six) hours  as needed for moderate pain. 07/31/18   Kathlen Mody, MD  witch hazel-glycerin (TUCKS) pad Apply 1 application topically 3 (three) times daily. 07/31/18   Kathlen Mody, MD    Family History Family History  Problem Relation Age of Onset  . Hypertension Mother   . Hypertension Father     Social History Social History   Tobacco Use  . Smoking status: Former Games developer  . Smokeless tobacco: Never Used  . Tobacco comment: 07/27/2018 "stopped in 2011"  Substance Use Topics  . Alcohol use: Not Currently  . Drug use: Never     Allergies   Asa [aspirin], Hydrocodone-acetaminophen, Ibuprofen, and Morphine and related   Review of Systems Review of Systems  Constitutional: Negative for fever.  Gastrointestinal: Negative for abdominal pain, nausea and vomiting.  Genitourinary: Negative for difficulty urinating.  Musculoskeletal: Positive for back pain. Negative for neck pain.  Neurological: Negative for weakness and numbness.     Physical Exam Updated Vital Signs BP (!) 146/115   Pulse 96   Temp 98 F (36.7 C)   Resp 18   Wt 125 kg   SpO2 97%   BMI 48.82 kg/m   Physical Exam Vitals signs and nursing note  reviewed.  Constitutional:      Appearance: She is well-developed.  Neck:     Comments: No midline or paraspinal tenderness. Full ROM without pain. Cardiovascular:     Rate and Rhythm: Normal rate and regular rhythm.     Heart sounds: Normal heart sounds.  Pulmonary:     Effort: Pulmonary effort is normal. No respiratory distress.     Breath sounds: Normal breath sounds.  Abdominal:     General: Bowel sounds are normal. There is no distension.     Palpations: Abdomen is soft.     Tenderness: There is no abdominal tenderness.  Musculoskeletal:     Comments: Tenderness to palpation diffusely across low back and into left buttocks. 5/5 muscle strength in bilateral LE's. Straight leg raises are negative bilaterally for radicular symptoms. Able to ambulate independently with steady gait.  Skin:    General: Skin is warm and dry.     Findings: No erythema or rash.  Neurological:     Mental Status: She is alert and oriented to person, place, and time.     Deep Tendon Reflexes: Reflexes are normal and symmetric.     Comments: Bilateral lower extremities neurovascularly intact.      ED Treatments / Results  Labs (all labs ordered are listed, but only abnormal results are displayed) Labs Reviewed - No data to display  EKG None  Radiology No results found.  Procedures Procedures (including critical care time)  Medications Ordered in ED Medications  oxyCODONE-acetaminophen (PERCOCET/ROXICET) 5-325 MG per tablet 2 tablet (2 tablets Oral Given 09/07/19 1428)  ketorolac (TORADOL) 30 MG/ML injection 30 mg (30 mg Intramuscular Given 09/07/19 1428)  cyclobenzaprine (FLEXERIL) tablet 10 mg (10 mg Oral Given 09/07/19 1428)  fentaNYL (SUBLIMAZE) injection 50 mcg (50 mcg Nasal Given 09/07/19 1516)     Initial Impression / Assessment and Plan / ED Course  I have reviewed the triage vital signs and the nursing notes.  Pertinent labs & imaging results that were available during my care of  the patient were reviewed by me and considered in my medical decision making (see chart for details).       Nicole [name] is a 53 y.o. female who presents to ED for acute worsening of chronic left-sided low back  pain.  Patient demonstrates no lower extremity weakness, saddle anesthesia, bowel or bladder incontinence or neuro deficits. No concern for cauda equina. No fevers or other infectious symptoms to suggest that the patient's back pain is due to an infection. Lower extremities are neurovascularly intact and patient is ambulatory. I have reviewed return precautions, including the development of any of these signs or symptoms and the patient has voiced understanding. I reviewed symptomatic home care instructions and keep appointment with her doctor next week. RX for #5 of her home percocet to get  Her through the weekend until appointment. Patient voiced understanding and agreement with plan. All questions answered.   Final Clinical Impressions(s) / ED Diagnoses   Final diagnoses:  Left-sided low back pain without sciatica, unspecified chronicity    ED Discharge Orders         Ordered    oxyCODONE-acetaminophen (PERCOCET) 7.5-325 MG tablet  Every 4 hours PRN     09/07/19 1456           Erez Mccallum, Ozella Almond, PA-C 09/07/19 1532    Carmin Muskrat, MD 09/08/19 1319

## 2019-12-07 ENCOUNTER — Encounter: Payer: Self-pay | Admitting: Family Medicine

## 2019-12-07 ENCOUNTER — Ambulatory Visit (INDEPENDENT_AMBULATORY_CARE_PROVIDER_SITE_OTHER): Admitting: Family Medicine

## 2019-12-07 ENCOUNTER — Other Ambulatory Visit: Payer: Self-pay

## 2019-12-07 ENCOUNTER — Ambulatory Visit (INDEPENDENT_AMBULATORY_CARE_PROVIDER_SITE_OTHER)

## 2019-12-07 VITALS — BP 170/91 | HR 95 | Temp 98.2°F | Resp 17 | Ht 63.0 in | Wt 288.4 lb

## 2019-12-07 DIAGNOSIS — M25561 Pain in right knee: Secondary | ICD-10-CM

## 2019-12-07 DIAGNOSIS — M7051 Other bursitis of knee, right knee: Secondary | ICD-10-CM | POA: Diagnosis not present

## 2019-12-07 LAB — POCT CBC
Granulocyte percent: 72.8 %G (ref 37–80)
HCT, POC: 39.5 % (ref 29–41)
Hemoglobin: 13.3 g/dL (ref 11–14.6)
Lymph, poc: 2.2 (ref 0.6–3.4)
MCH, POC: 30.5 pg (ref 27–31.2)
MCHC: 33.6 g/dL (ref 31.8–35.4)
MCV: 90.6 fL (ref 76–111)
MID (cbc): 0.6 (ref 0–0.9)
MPV: 7.9 fL (ref 0–99.8)
POC Granulocyte: 7.7 — AB (ref 2–6.9)
POC LYMPH PERCENT: 21.2 %L (ref 10–50)
POC MID %: 6 %M (ref 0–12)
Platelet Count, POC: 336 10*3/uL (ref 142–424)
RBC: 4.36 M/uL (ref 4.04–5.48)
RDW, POC: 12.8 %
WBC: 10.6 10*3/uL — AB (ref 4.6–10.2)

## 2019-12-07 LAB — POCT SEDIMENTATION RATE: POCT SED RATE: 60 mm/hr — AB (ref 0–22)

## 2019-12-07 MED ORDER — DICLOFENAC SODIUM 75 MG PO TBEC: 75.0000 mg | DELAYED_RELEASE_TABLET | Freq: Two times a day (BID) | ORAL | 0 refills | Status: AC

## 2019-12-07 NOTE — Patient Instructions (Addendum)
Take diclofenac 75 mg 1 twice daily  Apply heat to the knee several times daily for about 15 minutes  Avoid excessive weightbearing  If the medicine is not coming down in the next 3 or 4 days, then we probably will want to try given injection of cortisone and/or send you to an orthopedic doctor.    If you have lab work done today you will be contacted with your lab results within the next 2 weeks.  If you have not heard from [place] then please contact us. The fastest way to get your results is to register for My Chart.   IF you received an x-ray today, you will receive an invoice from [facility]. Please contact [facility] at [phone] with questions or concerns regarding your invoice.   IF you received labwork today, you will receive an invoice from [place]. Please contact [facility] at [phone] with questions or concerns regarding your invoice.   Our billing staff will not be able to assist you with questions regarding bills from these companies.  You will be contacted with the lab results as soon as they are available. The fastest way to get your results is to activate your My Chart account. Instructions are located on the last page of this paperwork. If you have not heard from [place] regarding the results in 2 weeks, please contact this office.

## 2019-12-07 NOTE — Progress Notes (Signed)
Patient ID: Nicole [name], female    DOB: 1966/06/01  Age: 54 y.o. MRN: [id number]  Chief Complaint  Patient presents with  . Knee Pain    right knee x 2 week, feels like its bleeding but nothings running down leg and burns.  Swollen and getting worse.  Unable to sleep at night due to pain.  Pain level 10/10 , pt had left over hydrocodone that is not helping with pain.  Using hot and cold packs with no relief.    Subjective:   54 year old lady with diabetes and coronary artery disease.  For the past 2 weeks she has had pain in her right knee.  It has gotten worse gradually.  She knows of no injury.  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  This is different.  Is below the knee.  Current allergies, medications, problem list, past/family and social histories reviewed.  Objective:  BP (!) 170/91 (BP Location: Left Arm, Patient Position: Sitting, Cuff Size: Large)   Pulse 95   Temp 98.2 F (36.8 C) (Oral)   Resp 17   Ht 5\' 3"  (1.6 m)   Wt 288 lb 6.4 oz (130.8 kg)   SpO2 96%   BMI 51.09 kg/m   No warmth erythema noted.  She is hypersensitive.  Very tender from just lateral to the patella tendon, around under the kneecap to the medial aspect of the knee.  Complains of pain on dorsiflexion and plantarflexion of foot or toe compression of the shin and ankle.  Calf nontender and not red or warm.  A little tenderness left in the area above the knee, anterior and medial thigh.  No edema.  Assessment & Plan:   Assessment: 1. Acute pain of right knee   2. Infrapatellar bursitis of right knee       Plan: X-ray knee.  CBC and sed rate. Although patient is listed as being allergic to ibuprofen, she just had some stomach upset from meds.  She also is now allergic to aspirin which she takes every day.  X-ray shows some mild degenerative changes tricompartmentally.  Treat for the inflammation.  Orders Placed This Encounter  Procedures  . DG Knee  Complete 4 Views Right    Order Specific Question:   Reason for Exam (SYMPTOM  OR DIAGNOSIS REQUIRED)    Answer:   knee pain, below and medial to knee, poss bursitis    Order Specific Question:   Is patient pregnant?    Answer:   No    Order Specific Question:   Preferred imaging location?    Answer:   External    Order Specific Question:   Radiology Contrast Protocol - do NOT remove file path    Answer:   [email or web address]  . POCT CBC  . POCT SEDIMENTATION RATE    Meds ordered this encounter  Medications  . diclofenac (VOLTAREN) 75 MG EC tablet    Sig: Take 1 tablet (75 mg total) by mouth 2 (two) times daily.    Dispense:  30 tablet    Refill:  0         Patient Instructions   Take diclofenac 75 mg 1 twice daily  Apply heat to the knee several times daily for about 15 minutes  Avoid excessive weightbearing  If the medicine is not coming down in the next 3 or 4 days, then we probably will want to try given injection of cortisone and/or send you to  an orthopedic doctor.    If you have lab work done today you will be contacted with your lab results within the next 2 weeks.  If you have not heard from [place] then please contact us. The fastest way to get your results is to register for My Chart.   IF you received an x-ray today, you will receive an invoice from [facility]. Please contact [facility] at [phone] with questions or concerns regarding your invoice.   IF you received labwork today, you will receive an invoice from [place]. Please contact [facility] at [phone] with questions or concerns regarding your invoice.   Our billing staff will not be able to assist you with questions regarding bills from these companies.  You will be contacted with the lab results as soon as they are available. The fastest way to get your results is to activate your My Chart account. Instructions are located on the last page  of this paperwork. If you have not heard from [place] regarding the results in 2 weeks, please contact this office.        No follow-ups on file.   Janace Hoard, MD 12/07/2019

## 2020-02-11 ENCOUNTER — Encounter (HOSPITAL_COMMUNITY): Payer: Self-pay | Admitting: Emergency Medicine

## 2020-02-11 ENCOUNTER — Emergency Department (HOSPITAL_COMMUNITY)
Admission: EM | Admit: 2020-02-11 | Discharge: 2020-02-11 | Disposition: A | Discharge status: Home / Self Care | Attending: Emergency Medicine | Admitting: Emergency Medicine

## 2020-02-11 ENCOUNTER — Other Ambulatory Visit: Payer: Self-pay

## 2020-02-11 DIAGNOSIS — Z5321 Procedure and treatment not carried out due to patient leaving prior to being seen by health care provider: Secondary | ICD-10-CM | POA: Insufficient documentation

## 2020-02-11 DIAGNOSIS — M545 Low back pain: Secondary | ICD-10-CM | POA: Insufficient documentation

## 2020-02-11 NOTE — ED Triage Notes (Signed)
Pt to ED with c/o lower back pain radiating into right leg and right knee.  Pt st's she is currently taking Diazepam 2mg  and Gabapentin 300mg  without relief.  St's she was told to come to ED if needed for pain.  Pt st's pain started over 1 year ago

## 2020-02-11 NOTE — ED Notes (Signed)
Pt st's she not waiting anymore

## 2020-03-18 ENCOUNTER — Other Ambulatory Visit: Payer: Self-pay

## 2020-03-18 ENCOUNTER — Encounter (HOSPITAL_BASED_OUTPATIENT_CLINIC_OR_DEPARTMENT_OTHER): Payer: Self-pay | Admitting: *Deleted

## 2020-03-18 ENCOUNTER — Emergency Department (HOSPITAL_BASED_OUTPATIENT_CLINIC_OR_DEPARTMENT_OTHER)
Admission: EM | Admit: 2020-03-18 | Discharge: 2020-03-18 | Disposition: A | Discharge status: Home / Self Care | Attending: Emergency Medicine | Admitting: Emergency Medicine

## 2020-03-18 DIAGNOSIS — Z7984 Long term (current) use of oral hypoglycemic drugs: Secondary | ICD-10-CM | POA: Diagnosis not present

## 2020-03-18 DIAGNOSIS — M5441 Lumbago with sciatica, right side: Secondary | ICD-10-CM | POA: Insufficient documentation

## 2020-03-18 DIAGNOSIS — I10 Essential (primary) hypertension: Secondary | ICD-10-CM | POA: Diagnosis not present

## 2020-03-18 DIAGNOSIS — Z7982 Long term (current) use of aspirin: Secondary | ICD-10-CM | POA: Diagnosis not present

## 2020-03-18 DIAGNOSIS — E119 Type 2 diabetes mellitus without complications: Secondary | ICD-10-CM | POA: Diagnosis not present

## 2020-03-18 DIAGNOSIS — G8929 Other chronic pain: Secondary | ICD-10-CM | POA: Diagnosis not present

## 2020-03-18 DIAGNOSIS — Z79899 Other long term (current) drug therapy: Secondary | ICD-10-CM | POA: Insufficient documentation

## 2020-03-18 DIAGNOSIS — Z87891 Personal history of nicotine dependence: Secondary | ICD-10-CM | POA: Insufficient documentation

## 2020-03-18 DIAGNOSIS — Z8673 Personal history of transient ischemic attack (TIA), and cerebral infarction without residual deficits: Secondary | ICD-10-CM | POA: Diagnosis not present

## 2020-03-18 DIAGNOSIS — M549 Dorsalgia, unspecified: Secondary | ICD-10-CM | POA: Diagnosis present

## 2020-03-18 LAB — CBC WITH DIFFERENTIAL/PLATELET
Abs Immature Granulocytes: 0.03 10*3/uL (ref 0.00–0.07)
Basophils Absolute: 0 10*3/uL (ref 0.0–0.1)
Basophils Relative: 0 %
Eosinophils Absolute: 0 10*3/uL (ref 0.0–0.5)
Eosinophils Relative: 0 %
HCT: 40.1 % (ref 36.0–46.0)
Hemoglobin: 14.1 g/dL (ref 12.0–15.0)
Immature Granulocytes: 0 %
Lymphocytes Relative: 23 %
Lymphs Abs: 2.1 10*3/uL (ref 0.7–4.0)
MCH: 31.5 pg (ref 26.0–34.0)
MCHC: 35.2 g/dL (ref 30.0–36.0)
MCV: 89.5 fL (ref 80.0–100.0)
Monocytes Absolute: 0.5 10*3/uL (ref 0.1–1.0)
Monocytes Relative: 5 %
Neutro Abs: 6.5 10*3/uL (ref 1.7–7.7)
Neutrophils Relative %: 72 %
Platelets: 328 10*3/uL (ref 150–400)
RBC: 4.48 MIL/uL (ref 3.87–5.11)
RDW: 12.7 % (ref 11.5–15.5)
WBC: 9.1 10*3/uL (ref 4.0–10.5)
nRBC: 0 % (ref 0.0–0.2)

## 2020-03-18 MED ORDER — METHOCARBAMOL 500 MG PO TABS
1000.0000 mg | ORAL_TABLET | Freq: Once | ORAL | Status: AC
  Administered 2020-03-18: 1000 mg via ORAL
  Filled 2020-03-18: qty 2

## 2020-03-18 MED ORDER — FENTANYL CITRATE (PF) 100 MCG/2ML IJ SOLN
50.0000 ug | Freq: Once | INTRAMUSCULAR | Status: AC
  Administered 2020-03-18: 50 ug via INTRAVENOUS
  Filled 2020-03-18: qty 2

## 2020-03-18 MED ORDER — METHYLPREDNISOLONE SODIUM SUCC 125 MG IJ SOLR
125.0000 mg | Freq: Once | INTRAMUSCULAR | Status: AC
  Administered 2020-03-18: 16:00:00 125 mg via INTRAVENOUS
  Filled 2020-03-18: qty 2

## 2020-03-18 MED ORDER — METHOCARBAMOL 750 MG PO TABS
750.0000 mg | ORAL_TABLET | Freq: Two times a day (BID) | ORAL | 0 refills | Status: DC | PRN
Start: 2020-03-18 — End: 2020-07-26

## 2020-03-18 MED ORDER — GABAPENTIN 300 MG PO CAPS
300.0000 mg | ORAL_CAPSULE | Freq: Three times a day (TID) | ORAL | 0 refills | Status: DC
Start: 2020-03-18 — End: 2020-07-26

## 2020-03-18 MED ORDER — PREDNISONE 10 MG (21) PO TBPK
ORAL_TABLET | ORAL | 0 refills | Status: DC
Start: 2020-03-18 — End: 2020-07-26

## 2020-03-18 NOTE — ED Triage Notes (Addendum)
Lower back and bilateral knee pain. No known injury. States she has had vaginal bleeding almost every day for months. States she gets weak and falls.

## 2020-03-18 NOTE — Discharge Instructions (Signed)
  Prednisone: Take the prednisone, as prescribed, until finished. If you are a diabetic, please know prednisone can raise your blood sugar temporarily. Methocarbamol: Methocarbamol (generic for Robaxin) is a muscle relaxer and can help relieve stiff muscles or muscle spasms.  Do not drive or perform other dangerous activities while taking this medication as it can cause drowsiness as well as changes in reaction time and judgement. Gabapentin: Gabapentin (generic for Neurontin) is a medication designed to help with the nerve pain.  Use caution as you learn how your body will respond to this medication as it can sometimes cause dizziness or lightheadedness. Follow-up with your primary care provider or orthopedic specialist for any further management of these symptoms.

## 2020-03-18 NOTE — ED Notes (Signed)
Pt reports she has been seeing Dr. Robynn Pane for her back for approx. A year.  Pt. Is to see Neuro. In June 2021 for back.  Pt. Now having severe back pain  Running into the R leg.  Pt. Also knee injury having injections done at nerve center she reports totally different thing per Pt.    Pt. Reports the pain in her back is 10/10.  She has no pain meds at present time at home.

## 2020-03-18 NOTE — ED Provider Notes (Signed)
[facility] EMERGENCY DEPARTMENT Provider Note   CSN: [id number] Arrival date & time: 03/18/20  1343     History Chief Complaint  Patient presents with  . Back Pain  . Knee Pain    Nicole [name] is a 54 y.o. female.  HPI      Nicole [name] is a 54 y.o. female, with a history of anemia, anxiety, asthma, bipolar, HTN, hyperlipidemia, obesity, DM, presenting to the ED with right-sided lower back and knee pain.  She states this has been bothering her for several months, but states, "It has just gotten too bad to where I cannot stand it."  She states it has been "bad" for weeks. She has been taking ibuprofen and Tylenol.  She has upcoming MRIs scheduled. She denies fever/chills, urinary symptoms, changes in bowel or bladder function, fall/trauma, abdominal pain, N/V/D, dizziness, numbness, weakness, or any other complaints.     Past Medical History:  Diagnosis Date  . Anemia   . Anxiety attack   . Asthma   . Bipolar disorder (HCC)   . Chronic lower back pain    scheduled to see pain management (07/27/2018)  . Depression   . History of blood transfusion 12/2017; 07/27/2018  . Hyperlipemia   . Hypertension   . Migraines    "5-6/year now" (07/27/2018)  . Obesity   . OSA on CPAP    inconsistent use (07/27/2018)  . Panic attacks   . Sciatica   . Stroke [facility]) 12/2017   PICA distribution; "fully recovered" (07/27/2018)  . Type II diabetes mellitus [facility])     Patient Active Problem List   Diagnosis Date Noted  . Symptomatic anemia 07/27/2018  . Hematochezia 07/27/2018  . Acute respiratory failure with hypoxia (HCC) 01/23/2018  . Migraines 01/23/2018  . 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 Obesity 01/23/2018  . Hyperlipemia 01/23/2018  . Diabetes mellitus type 2 in obese (HCC) 01/23/2018  . OSA on CPAP 01/23/2018  . Acute hypokalemia 01/23/2018  . Left ventricular diastolic  dysfunction 01/23/2018  . Microcytic anemia 01/23/2018  . Hypoxia 01/23/2018  . 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 Gait disturbance, post-stroke 01/06/2018  . Leukocytosis   . Hypokalemia   . Vascular headache   . Hyponatremia   . Acute blood loss anemia   . Essential hypertension   . 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 Cytotoxic brain edema (HCC) 12/20/2017  . Hydrocephalus (HCC) 12/20/2017  . 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 Encounter for central line placement   . Nausea vomiting and diarrhea     Past Surgical History:  Procedure Laterality Date  . CESAREAN SECTION  1988  . ENDOMETRIAL ABLATION  2017   occasional vaginal bleeding  . EYE SURGERY Left 1987   "cyst on eyelid removed"  . FLEXIBLE SIGMOIDOSCOPY N/A 07/28/2018   Procedure: FLEXIBLE SIGMOIDOSCOPY;  Surgeon: Kathi Der, MD;  Location: [facility] ENDOSCOPY;  Service: Gastroenterology;  Laterality: N/A;  . HEMORRHOID SURGERY N/A 07/29/2018   Procedure: HEMORRHOIDECTOMY;  Surgeon: Violeta Gelinas, MD;  Location: [facility] OR;  Service: General;  Laterality: N/A;  . TUBAL LIGATION  1988     OB History   No obstetric history on file.     Family History  Problem Relation Age of Onset  . Hypertension Mother   . Hypertension Father     Social History   Tobacco Use  . Smoking status: Former Games developer  .  Smokeless tobacco: Never Used  . Tobacco comment: 07/27/2018 "stopped in 2011"  Substance Use Topics  . Alcohol use: Not Currently  . Drug use: Never    Home Medications Prior to Admission medications   Medication Sig Start Date End Date Taking? Authorizing Provider  acetaminophen (TYLENOL) 325 MG tablet Take 650 mg by mouth every 6 (six) hours as needed for mild pain.    [provider]  albuterol (VENTOLIN HFA) 108 (90 Base) MCG/ACT inhaler Inhale 2 puffs into the lungs every 4 (four) hours as needed for wheezing. 03/21/17   [provider]    aspirin EC 81 MG EC tablet Take 1 tablet (81 mg total) by mouth daily. Patient taking differently: Take 81 mg by mouth at bedtime.  12/28/17   Angiulli, Mcarthur Rossetti, PA-C  atorvastatin (LIPITOR) 80 MG tablet Take 1 tablet (80 mg total) by mouth daily. 12/28/17   Angiulli, Mcarthur Rossetti, PA-C  butalbital-acetaminophen-caffeine (FIORICET, ESGIC) [phone] MG tablet Take 1 tablet by mouth every 6 (six) hours as needed for headache or migraine. 12/28/17   Angiulli, Mcarthur Rossetti, PA-C  diclofenac (VOLTAREN) 75 MG EC tablet Take 1 tablet (75 mg total) by mouth 2 (two) times daily. 12/07/19   Peyton Najjar, MD  diltiazem (CARDIZEM CD) 240 MG 24 hr capsule Take 240 mg by mouth daily. 07/15/18   [provider]  docusate sodium (COLACE) 100 MG capsule Take 1 capsule (100 mg total) by mouth 2 (two) times daily. 07/31/18   Kathlen Mody, MD  FLUoxetine (PROZAC) 20 MG tablet Take 2 tablets (40 mg total) by mouth 2 (two) times daily. 12/28/17   Angiulli, Mcarthur Rossetti, PA-C  folic acid (FOLVITE) 1 MG tablet Take 1 tablet (1 mg total) by mouth daily. 07/31/18   Kathlen Mody, MD  furosemide (LASIX) 20 MG tablet Take 20 mg by mouth 2 (two) times daily. 06/24/18   [provider]  gabapentin (NEURONTIN) 300 MG capsule Take 1 capsule (300 mg total) by mouth 3 (three) times daily for 15 days. 03/18/20 04/02/20  Nello Corro, Hillard Danker, PA-C  hydrocortisone (ANUSOL-HC) 25 MG suppository Place 1 suppository (25 mg total) rectally 2 (two) times daily. 07/31/18   Kathlen Mody, MD  lidocaine (LIDODERM) 5 % Place 1 patch onto the skin daily. Remove & Discard patch within 12 hours or as directed by MD 10/25/17   Anselm Pancoast, PA-C  meclizine (ANTIVERT) 12.5 MG tablet Take 1 tablet (12.5 mg total) by mouth 3 (three) times daily as needed for dizziness. 12/28/17   Angiulli, Mcarthur Rossetti, PA-C  metFORMIN (GLUCOPHAGE) 500 MG tablet Take 1 tablet (500 mg total) by mouth 2 (two) times daily with a meal. 12/28/17 12/28/18  Angiulli, Mcarthur Rossetti, PA-C   methocarbamol (ROBAXIN) 750 MG tablet Take 1 tablet (750 mg total) by mouth 2 (two) times daily as needed for muscle spasms (or muscle tightness). 03/18/20   Hyacinth Marcelli C, PA-C  metoprolol succinate (TOPROL-XL) 25 MG 24 hr tablet Take 25 mg by mouth daily. 06/24/18   [provider]  omeprazole (PRILOSEC) 20 MG capsule Take 20 mg by mouth at bedtime.    [provider]  ondansetron (ZOFRAN) 4 MG tablet Take 4 mg by mouth every 8 (eight) hours as needed for nausea or vomiting.    [provider]  OVER THE COUNTER MEDICATION CPAP    [provider]  oxyCODONE-acetaminophen (PERCOCET) 7.5-325 MG tablet Take 1 tablet by mouth every 4 (four) hours as needed for severe  pain. 09/07/19   Ward, Chase Picket, PA-C  polyethylene glycol [facility] / GLYCOLAX) packet Take 17 g by mouth daily. 07/31/18   Kathlen Mody, MD  potassium chloride (K-DUR) 10 MEQ tablet Take 10 mEq by mouth daily. 06/01/18   [provider]  potassium chloride SA (K-DUR) 20 MEQ tablet Take 1 tablet (20 mEq total) by mouth 2 (two) times daily. 03/25/19   Margarita Grizzle, MD  predniSONE (STERAPRED UNI-PAK 21 TAB) 10 MG (21) TBPK tablet Take 6 tabs (60mg ) day 1, 5 tabs (50mg ) day 2, 4 tabs (40mg ) day 3, 3 tabs (30mg ) day 4, 2 tabs (20mg ) day 5, and 1 tab (10mg ) day 6. 03/18/20   Bobak Oguinn C, PA-C  SUMAtriptan (IMITREX) 100 MG tablet Take 100 mg by mouth as needed. 06/01/18   [provider]  traMADol (ULTRAM) 50 MG tablet Take 2 tablets (100 mg total) by mouth every 6 (six) hours as needed for moderate pain. 07/31/18   , MD  witch hazel-glycerin (TUCKS) pad Apply 1 application topically 3 (three) times daily. 07/31/18   , MD    Allergies    Asa [aspirin], Hydrocodone-acetaminophen, Ibuprofen, and Morphine and related  Review of Systems   Review of Systems  Constitutional: Negative for chills, diaphoresis and fever.  Respiratory: Negative for shortness of breath.    Cardiovascular: Negative for chest pain.  Gastrointestinal: Negative for abdominal pain, diarrhea, nausea and vomiting.  Genitourinary: Negative for difficulty urinating.  Musculoskeletal: Positive for arthralgias and back pain.  Neurological: Negative for weakness and numbness.  All other systems reviewed and are negative.   Physical Exam Updated Vital Signs BP 134/81   Pulse (!) 123   Temp 99 F (37.2 C) (Oral)   Resp 20   Ht 5\' 3"  (1.6 m)   Wt 135.2 kg   SpO2 97%   BMI 52.79 kg/m   Physical Exam Vitals and nursing note reviewed.  Constitutional:      General: She is not in acute distress.    Appearance: She is well-developed. She is obese. She is not diaphoretic.  HENT:     Head: Normocephalic and atraumatic.     Mouth/Throat:     Mouth: Mucous membranes are moist.     Pharynx: Oropharynx is clear.  Eyes:     Conjunctiva/sclera: Conjunctivae normal.  Cardiovascular:     Rate and Rhythm: Normal rate and regular rhythm.     Pulses: Normal pulses.          Radial pulses are 2+ on the right side and 2+ on the left side.       Dorsalis pedis pulses are 2+ on the right side and 2+ on the left side.       Posterior tibial pulses are 2+ on the right side and 2+ on the left side.     Heart sounds: Normal heart sounds.     Comments: Tactile temperature in the extremities appropriate and equal bilaterally. Pulmonary:     Effort: Pulmonary effort is normal. No respiratory distress.     Breath sounds: Normal breath sounds.  Abdominal:     Palpations: Abdomen is soft.     Tenderness: There is no abdominal tenderness. There is no guarding.  Musculoskeletal:     Cervical back: Neck supple.     Right lower leg: No edema.     Left lower leg: No edema.     Comments: Tenderness to the right lower back in the lumbar musculature. Tenderness to  the right knee without noted swelling, deformity, instability, color abnormality, or increased warmth.  No tenderness to the rest of the  lower extremities. No tenderness or noted pain in the hips.  Lymphadenopathy:     Cervical: No cervical adenopathy.  Skin:    General: Skin is warm and dry.  Neurological:     Mental Status: She is alert.     Comments: Sensation grossly intact to light touch in the lower extremities bilaterally. No saddle anesthesias. Strength 5/5 in the bilateral lower extremities. Patient had steady, independent gait after pain was well managed. Coordination intact.  Psychiatric:        Mood and Affect: Mood and affect normal.        Speech: Speech normal.        Behavior: Behavior normal.     ED Results / Procedures / Treatments   Labs (all labs ordered are listed, but only abnormal results are displayed) Labs Reviewed  CBC WITH DIFFERENTIAL/PLATELET    EKG None  Radiology No results found.  Procedures Procedures (including critical care time)  Medications Ordered in ED Medications  fentaNYL (SUBLIMAZE) injection 50 mcg (50 mcg Intravenous Given 03/18/20 1629)  methylPREDNISolone sodium succinate (SOLU-MEDROL) 125 mg/2 mL injection 125 mg (125 mg Intravenous Given 03/18/20 1627)  methocarbamol (ROBAXIN) tablet 1,000 mg (1,000 mg Oral Given 03/18/20 1625)    ED Course  I have reviewed the triage vital signs and the nursing notes.  Pertinent labs & imaging results that were available during my care of the patient were reviewed by me and considered in my medical decision making (see chart for details).  Clinical Course as of Mar 20 1505  Tue Mar 18, 2020  1715 Patient voices significant improvement in her pain.   [SJ]  6294 Manually noted to be in the 80s.   Pulse Rate(!): 106 [SJ]    Clinical Course User Index [SJ] Nahuel Wilbert, Helane Gunther, PA-C   MDM Rules/Calculators/A&P                      Patient presents with acute on chronic lower back and knee pain.  No focal neurologic deficits.  No findings to suggest more serious pathology. We were able to get the patient's pain significantly  improved. The patient was given instructions for home care as well as return precautions. Patient voices understanding of these instructions, accepts the plan, and is comfortable with discharge.    Final Clinical Impression(s) / ED Diagnoses Final diagnoses:  Chronic right-sided low back pain with right-sided sciatica    Rx / DC Orders ED Discharge Orders         Ordered    predniSONE (STERAPRED UNI-PAK 21 TAB) 10 MG (21) TBPK tablet     03/18/20 1725    methocarbamol (ROBAXIN) 750 MG tablet  2 times daily PRN     03/18/20 1725    gabapentin (NEURONTIN) 300 MG capsule  3 times daily     03/18/20 1725           Lorayne Bender, PA-C 03/20/20 1509    Gareth Morgan, MD 03/26/20 2236

## 2020-03-18 NOTE — ED Notes (Signed)
Pt ambulated at even steady gait. Pt reports pain symptoms have significantly improved.

## 2020-07-26 ENCOUNTER — Other Ambulatory Visit: Payer: Self-pay

## 2020-07-26 ENCOUNTER — Encounter (HOSPITAL_BASED_OUTPATIENT_CLINIC_OR_DEPARTMENT_OTHER): Payer: Self-pay

## 2020-07-26 ENCOUNTER — Emergency Department (HOSPITAL_BASED_OUTPATIENT_CLINIC_OR_DEPARTMENT_OTHER)
Admission: EM | Admit: 2020-07-26 | Discharge: 2020-07-26 | Disposition: A | Discharge status: Home / Self Care | Attending: Emergency Medicine | Admitting: Emergency Medicine

## 2020-07-26 DIAGNOSIS — G8929 Other chronic pain: Secondary | ICD-10-CM | POA: Diagnosis not present

## 2020-07-26 DIAGNOSIS — J45909 Unspecified asthma, uncomplicated: Secondary | ICD-10-CM | POA: Insufficient documentation

## 2020-07-26 DIAGNOSIS — Z7982 Long term (current) use of aspirin: Secondary | ICD-10-CM | POA: Insufficient documentation

## 2020-07-26 DIAGNOSIS — Z7984 Long term (current) use of oral hypoglycemic drugs: Secondary | ICD-10-CM | POA: Insufficient documentation

## 2020-07-26 DIAGNOSIS — E119 Type 2 diabetes mellitus without complications: Secondary | ICD-10-CM | POA: Diagnosis not present

## 2020-07-26 DIAGNOSIS — Z87891 Personal history of nicotine dependence: Secondary | ICD-10-CM | POA: Insufficient documentation

## 2020-07-26 DIAGNOSIS — M25561 Pain in right knee: Secondary | ICD-10-CM | POA: Diagnosis not present

## 2020-07-26 DIAGNOSIS — I10 Essential (primary) hypertension: Secondary | ICD-10-CM | POA: Diagnosis not present

## 2020-07-26 DIAGNOSIS — M545 Low back pain, unspecified: Secondary | ICD-10-CM

## 2020-07-26 DIAGNOSIS — Z79899 Other long term (current) drug therapy: Secondary | ICD-10-CM | POA: Insufficient documentation

## 2020-07-26 DIAGNOSIS — M549 Dorsalgia, unspecified: Secondary | ICD-10-CM | POA: Diagnosis present

## 2020-07-26 MED ORDER — GABAPENTIN 300 MG PO CAPS: 300.0000 mg | ORAL_CAPSULE | Freq: Three times a day (TID) | ORAL | 0 refills | Status: AC

## 2020-07-26 MED ORDER — METHOCARBAMOL 500 MG PO TABS
500.0000 mg | ORAL_TABLET | Freq: Two times a day (BID) | ORAL | 0 refills | Status: AC
Start: 2020-07-26 — End: ?

## 2020-07-26 MED ORDER — METHOCARBAMOL 500 MG PO TABS
1000.0000 mg | ORAL_TABLET | Freq: Once | ORAL | Status: AC
  Administered 2020-07-26: 1000 mg via ORAL
  Filled 2020-07-26: qty 2

## 2020-07-26 MED ORDER — PREDNISONE 10 MG (21) PO TBPK: ORAL_TABLET | Freq: Every day | ORAL | 0 refills | Status: AC

## 2020-07-26 MED ORDER — METHYLPREDNISOLONE SODIUM SUCC 125 MG IJ SOLR
125.0000 mg | Freq: Once | INTRAMUSCULAR | Status: AC
  Administered 2020-07-26: 125 mg via INTRAVENOUS
  Filled 2020-07-26: qty 2

## 2020-07-26 MED ORDER — FENTANYL CITRATE (PF) 100 MCG/2ML IJ SOLN
50.0000 ug | Freq: Once | INTRAMUSCULAR | Status: AC
  Administered 2020-07-26: 50 ug via INTRAVENOUS
  Filled 2020-07-26: qty 2

## 2020-07-26 NOTE — ED Triage Notes (Signed)
Pt here with chronic back pain and R knee that has been gradually becoming worse. Pt taking OTC medication without relief. Pt states the chronic pain gives her a migraine.

## 2020-07-26 NOTE — Discharge Instructions (Addendum)
I prescribed you prednisone, Robaxin, gabapentin for your pain.  These were the medications that you were given at your last visit and he stated provided relief.  Prednisone is a strong steroid.  Please take this earlier in the day as it can cause difficulty sleeping.  Robaxin is a strong muscle relaxer.  Do not mix with alcohol..  Please return to the ER with new or worsening symptoms.  I would recommend following up with your primary care provider regarding your chronic pain.  It was a pleasure to meet you both.

## 2020-07-26 NOTE — ED Provider Notes (Signed)
[facility] EMERGENCY DEPARTMENT Provider Note   CSN: [id number] Arrival date & time: 07/26/20  1730     History Chief Complaint  Patient presents with  . Back Pain    Nicole [name] is a 54 y.o. female.  HPI Patient is a 54 year old female with a medical history as noted below. Patient states that she has a history of chronic low back and right knee pain. Patient was cleaning her home and "working more than she should have been" and began experiencing a worsening of her symptoms about 2 weeks ago. Patient states this morning her pain to continue to acutely worsen and is now experiencing additional migraine headache. States her HA is diffuse and throbbing. States it is consistent with previous migraines. Denies any recent falls or injuries. No fevers, chills, chest pain, shortness of breath, vomiting, diarrhea, abdominal pain, numbness, tingling, syncope.    Past Medical History:  Diagnosis Date  . Anemia   . Anxiety attack   . Asthma   . Bipolar disorder (HCC)   . Chronic lower back pain    scheduled to see pain management (07/27/2018)  . Depression   . History of blood transfusion 12/2017; 07/27/2018  . Hyperlipemia   . Hypertension   . Migraines    "5-6/year now" (07/27/2018)  . Obesity   . OSA on CPAP    inconsistent use (07/27/2018)  . Panic attacks   . Sciatica   . Stroke [facility]) 12/2017   PICA distribution; "fully recovered" (07/27/2018)  . Type II diabetes mellitus [facility])     Patient Active Problem List   Diagnosis Date Noted  . Symptomatic anemia 07/27/2018  . Hematochezia 07/27/2018  . Acute respiratory failure with hypoxia (HCC) 01/23/2018  . Migraines 01/23/2018  . 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 Obesity 01/23/2018  . Hyperlipemia 01/23/2018  . Diabetes mellitus type 2 in obese (HCC) 01/23/2018  . OSA on CPAP 01/23/2018  . Acute hypokalemia 01/23/2018  . Left  ventricular diastolic dysfunction 01/23/2018  . Microcytic anemia 01/23/2018  . Hypoxia 01/23/2018  . 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 Gait disturbance, post-stroke 01/06/2018  . Leukocytosis   . Hypokalemia   . Vascular headache   . Hyponatremia   . Acute blood loss anemia   . Essential hypertension   . 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 Cytotoxic brain edema (HCC) 12/20/2017  . Hydrocephalus (HCC) 12/20/2017  . 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 Encounter for central line placement   . Nausea vomiting and diarrhea     Past Surgical History:  Procedure Laterality Date  . CESAREAN SECTION  1988  . ENDOMETRIAL ABLATION  2017   occasional vaginal bleeding  . EYE SURGERY Left 1987   "cyst on eyelid removed"  . FLEXIBLE SIGMOIDOSCOPY N/A 07/28/2018   Procedure: FLEXIBLE SIGMOIDOSCOPY;  Surgeon: Kathi Der, MD;  Location: [facility] ENDOSCOPY;  Service: Gastroenterology;  Laterality: N/A;  . HEMORRHOID SURGERY N/A 07/29/2018   Procedure: HEMORRHOIDECTOMY;  Surgeon: Violeta Gelinas, MD;  Location: [facility] OR;  Service: General;  Laterality: N/A;  . TUBAL LIGATION  1988     OB History   No obstetric history on file.     Family History  Problem Relation Age of Onset  . Hypertension Mother   . Hypertension Father     Social History   Tobacco Use  . Smoking status: Former Games developer  . Smokeless tobacco: Never  Used  . Tobacco comment: 07/27/2018 "stopped in 2011"  Vaping Use  . Vaping Use: Never used  Substance Use Topics  . Alcohol use: Not Currently  . Drug use: Never    Home Medications Prior to Admission medications   Medication Sig Start Date End Date Taking? Authorizing Provider  acetaminophen (TYLENOL) 325 MG tablet Take 650 mg by mouth every 6 (six) hours as needed for mild pain.    [provider]  albuterol (VENTOLIN HFA) 108 (90 Base) MCG/ACT inhaler Inhale 2 puffs into the lungs every 4 (four) hours  as needed for wheezing. 03/21/17   [provider]  aspirin EC 81 MG EC tablet Take 1 tablet (81 mg total) by mouth daily. Patient taking differently: Take 81 mg by mouth at bedtime.  12/28/17   Angiulli, Mcarthur Rossetti, PA-C  atorvastatin (LIPITOR) 80 MG tablet Take 1 tablet (80 mg total) by mouth daily. 12/28/17   Angiulli, Mcarthur Rossetti, PA-C  butalbital-acetaminophen-caffeine (FIORICET, ESGIC) [phone] MG tablet Take 1 tablet by mouth every 6 (six) hours as needed for headache or migraine. 12/28/17   Angiulli, Mcarthur Rossetti, PA-C  diclofenac (VOLTAREN) 75 MG EC tablet Take 1 tablet (75 mg total) by mouth 2 (two) times daily. 12/07/19   Peyton Najjar, MD  diltiazem (CARDIZEM CD) 240 MG 24 hr capsule Take 240 mg by mouth daily. 07/15/18   [provider]  docusate sodium (COLACE) 100 MG capsule Take 1 capsule (100 mg total) by mouth 2 (two) times daily. 07/31/18   Kathlen Mody, MD  FLUoxetine (PROZAC) 20 MG tablet Take 2 tablets (40 mg total) by mouth 2 (two) times daily. 12/28/17   Angiulli, Mcarthur Rossetti, PA-C  folic acid (FOLVITE) 1 MG tablet Take 1 tablet (1 mg total) by mouth daily. 07/31/18   Kathlen Mody, MD  furosemide (LASIX) 20 MG tablet Take 20 mg by mouth 2 (two) times daily. 06/24/18   [provider]  gabapentin (NEURONTIN) 300 MG capsule Take 1 capsule (300 mg total) by mouth 3 (three) times daily for 15 days. 03/18/20 04/02/20  Joy, Hillard Danker, PA-C  hydrocortisone (ANUSOL-HC) 25 MG suppository Place 1 suppository (25 mg total) rectally 2 (two) times daily. 07/31/18   Kathlen Mody, MD  lidocaine (LIDODERM) 5 % Place 1 patch onto the skin daily. Remove & Discard patch within 12 hours or as directed by MD 10/25/17   Anselm Pancoast, PA-C  meclizine (ANTIVERT) 12.5 MG tablet Take 1 tablet (12.5 mg total) by mouth 3 (three) times daily as needed for dizziness. 12/28/17   Angiulli, Mcarthur Rossetti, PA-C  metFORMIN (GLUCOPHAGE) 500 MG tablet Take 1 tablet (500 mg total) by mouth 2 (two) times daily with a  meal. 12/28/17 12/28/18  Angiulli, Mcarthur Rossetti, PA-C  methocarbamol (ROBAXIN) 750 MG tablet Take 1 tablet (750 mg total) by mouth 2 (two) times daily as needed for muscle spasms (or muscle tightness). 03/18/20   Joy, Shawn C, PA-C  metoprolol succinate (TOPROL-XL) 25 MG 24 hr tablet Take 25 mg by mouth daily. 06/24/18   [provider]  omeprazole (PRILOSEC) 20 MG capsule Take 20 mg by mouth at bedtime.    [provider]  ondansetron (ZOFRAN) 4 MG tablet Take 4 mg by mouth every 8 (eight) hours as needed for nausea or vomiting.    [provider]  OVER THE COUNTER MEDICATION CPAP    [provider]  oxyCODONE-acetaminophen (PERCOCET) 7.5-325 MG tablet Take 1 tablet by mouth every 4 (four)  hours as needed for severe pain. 09/07/19   Ward, Chase Picket, PA-C  polyethylene glycol (MIRALAX / GLYCOLAX) packet Take 17 g by mouth daily. 07/31/18   Kathlen Mody, MD  potassium chloride (K-DUR) 10 MEQ tablet Take 10 mEq by mouth daily. 06/01/18   [provider]  potassium chloride SA (K-DUR) 20 MEQ tablet Take 1 tablet (20 mEq total) by mouth 2 (two) times daily. 03/25/19   Margarita Grizzle, MD  predniSONE (STERAPRED UNI-PAK 21 TAB) 10 MG (21) TBPK tablet Take 6 tabs (60mg ) day 1, 5 tabs (50mg ) day 2, 4 tabs (40mg ) day 3, 3 tabs (30mg ) day 4, 2 tabs (20mg ) day 5, and 1 tab (10mg ) day 6. 03/18/20   Joy, Shawn C, PA-C  SUMAtriptan (IMITREX) 100 MG tablet Take 100 mg by mouth as needed. 06/01/18   [provider]  traMADol (ULTRAM) 50 MG tablet Take 2 tablets (100 mg total) by mouth every 6 (six) hours as needed for moderate pain. 07/31/18   , MD  witch hazel-glycerin (TUCKS) pad Apply 1 application topically 3 (three) times daily. 07/31/18   , MD    Allergies    Asa [aspirin], Hydrocodone-acetaminophen, Ibuprofen, and Morphine and related  Review of Systems   Review of Systems  All other systems reviewed and are negative. Ten systems reviewed  and are negative for acute change, except as noted in the HPI.   Physical Exam Updated Vital Signs BP 116/70 (BP Location: Left Arm)   Pulse 85   Temp 98.8 F (37.1 C) (Oral)   Resp 19   Ht 5\' 3"  (1.6 m)   Wt 127 kg   LMP 07/24/2020   SpO2 95%   BMI 49.60 kg/m   Physical Exam Vitals and nursing note reviewed.  Constitutional:      General: She is in acute distress.     Appearance: Normal appearance. She is well-developed. She is obese. She is not ill-appearing, toxic-appearing or diaphoretic.  HENT:     Head: Normocephalic and atraumatic.     Right Ear: External ear normal.     Left Ear: External ear normal.     Nose: Nose normal.     Mouth/Throat:     Pharynx: Oropharynx is clear.  Eyes:     General: No scleral icterus.       Right eye: No discharge.        Left eye: No discharge.     Conjunctiva/sclera: Conjunctivae normal.  Neck:     Trachea: No tracheal deviation.  Cardiovascular:     Rate and Rhythm: Normal rate.  Pulmonary:     Effort: Pulmonary effort is normal. No respiratory distress.     Breath sounds: No stridor.  Abdominal:     General: There is no distension.  Musculoskeletal:        General: Tenderness present. No swelling or deformity.     Cervical back: Neck supple.     Comments: Moderate diffuse TTP noted along the midline lumbar spine as well as the bilateral lumbar paraspinal musculature, left greater than right.  Additional mild TTP noted overlying the right anterior knee. Full range of motion of the right knee.  No edema, erythema, ecchymosis.  No increased warmth.  Distal sensation intact in the bilateral lower extremities. 2+ pedal pulses noted bilaterally. Patient able to move the lower extremities spontaneously and with ease.  Skin:    General: Skin is warm and dry.     Findings: No rash.  Neurological:  General: No focal deficit present.     Mental Status: She is alert and oriented to person, place, and time.     Cranial Nerves:  Cranial nerve deficit: no gross deficits.    ED Results / Procedures / Treatments   Labs (all labs ordered are listed, but only abnormal results are displayed) Labs Reviewed - No data to display  EKG None  Radiology No results found.  Procedures Procedures (including critical care time)  Medications Ordered in ED Medications  fentaNYL (SUBLIMAZE) injection 50 mcg (50 mcg Intravenous Given 07/26/20 2058)  methylPREDNISolone sodium succinate (SOLU-MEDROL) 125 mg/2 mL injection 125 mg (125 mg Intravenous Given 07/26/20 2059)  methocarbamol (ROBAXIN) tablet 1,000 mg (1,000 mg Oral Given 07/26/20 2059)   ED Course  I have reviewed the triage vital signs and the nursing notes.  Pertinent labs & imaging results that were available during my care of the patient were reviewed by me and considered in my medical decision making (see chart for details).    MDM Rules/Calculators/A&P                          Pt is a 54 y.o. female that presents with a history, physical exam, and ED Clinical Course as noted above.   Patient presents today with chronic lumbar and right knee pain.  Patient seen with similar symptoms in May of this year.  Patient also had imaging performed of both regions previously.  Patient denies any recent trauma or falls to the regions.   Did not feel that new imaging was warranted at this time and patient was agreeable.  At her prior visit she received fentanyl, Solu-Medrol, Robaxin.  She notes that this provided relief of all of her symptoms.  I gave her the same medications today and she notes complete relief of her symptoms.  She is sleeping comfortably in bed and states that she is ready to be discharged at this time.  Patient given prescriptions for Robaxin, gabapentin, prednisone.  She states that when taking these medications in the past they help with her chronic symptoms.  Recommended that she follow-up with her PCP as soon as possible for reevaluation.  She  knows she can return to the ER with new or worsening symptoms.  Her questions were answered and she was amicable the time of discharge.  Her vital signs have been stable throughout her stay.   An After Visit Summary was printed and given to the patient.  Patient discharged to home/self care.  Condition at discharge: Stable  Note: Portions of this report may have been transcribed using voice recognition software. Every effort was made to ensure accuracy; however, inadvertent computerized transcription errors may be present.   Final Clinical Impression(s) / ED Diagnoses Final diagnoses:  Chronic bilateral low back pain without sciatica  Chronic pain of right knee    Rx / DC Orders ED Discharge Orders         Ordered    gabapentin (NEURONTIN) 300 MG capsule  3 times daily        07/26/20 2218    methocarbamol (ROBAXIN) 500 MG tablet  2 times daily        07/26/20 2218    predniSONE (STERAPRED UNI-PAK 21 TAB) 10 MG (21) TBPK tablet  Daily        07/26/20 2218           Placido Sou, PA-C 07/26/20 2252    Charlynne Pander,  MD 07/26/20 2327

## 2020-10-10 ENCOUNTER — Other Ambulatory Visit: Payer: Self-pay

## 2020-10-10 ENCOUNTER — Encounter: Payer: Self-pay | Admitting: Emergency Medicine

## 2020-10-10 ENCOUNTER — Ambulatory Visit
Admission: EM | Admit: 2020-10-10 | Discharge: 2020-10-10 | Disposition: A | Discharge status: Home / Self Care | Attending: Emergency Medicine | Admitting: Emergency Medicine

## 2020-10-10 DIAGNOSIS — L03011 Cellulitis of right finger: Secondary | ICD-10-CM | POA: Diagnosis not present

## 2020-10-10 MED ORDER — DEXAMETHASONE SODIUM PHOSPHATE 10 MG/ML IJ SOLN
10.0000 mg | Freq: Once | INTRAMUSCULAR | Status: AC
  Administered 2020-10-10: 10 mg via INTRAMUSCULAR

## 2020-10-10 MED ORDER — KETOROLAC TROMETHAMINE 30 MG/ML IJ SOLN
30.0000 mg | Freq: Once | INTRAMUSCULAR | Status: AC
  Administered 2020-10-10: 30 mg via INTRAMUSCULAR

## 2020-10-10 MED ORDER — DOXYCYCLINE HYCLATE 100 MG PO CAPS: 100.0000 mg | ORAL_CAPSULE | Freq: Two times a day (BID) | ORAL | 0 refills | Status: AC

## 2020-10-10 NOTE — ED Triage Notes (Signed)
Pt here for wound with purulent drainage around right thumb cuticle x 2 days

## 2020-10-10 NOTE — ED Provider Notes (Signed)
[name]    CSN: [id number] Arrival date & time: 10/10/20  1353      History   Chief Complaint Chief Complaint  Patient presents with  . Hand Pain    HPI Nicole [name] is a 54 y.o. female  Presenting for R thumb paronychia x 2 days.  Denies trauma to affected area.  States she tried to bust it open last night which resulted in purulent, malodorous discharge.  Past Medical History:  Diagnosis Date  . Anemia   . Anxiety attack   . Asthma   . Bipolar disorder (HCC)   . Chronic lower back pain    scheduled to see pain management (07/27/2018)  . Depression   . History of blood transfusion 12/2017; 07/27/2018  . Hyperlipemia   . Hypertension   . Migraines    "5-6/year now" (07/27/2018)  . Obesity   . OSA on CPAP    inconsistent use (07/27/2018)  . Panic attacks   . Sciatica   . Stroke [facility]) 12/2017   PICA distribution; "fully recovered" (07/27/2018)  . Type II diabetes mellitus [facility])     Patient Active Problem List   Diagnosis Date Noted  . Symptomatic anemia 07/27/2018  . Hematochezia 07/27/2018  . Acute respiratory failure with hypoxia (HCC) 01/23/2018  . Migraines 01/23/2018  . 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 Obesity 01/23/2018  . Hyperlipemia 01/23/2018  . Diabetes mellitus type 2 in obese (HCC) 01/23/2018  . OSA on CPAP 01/23/2018  . Acute hypokalemia 01/23/2018  . Left ventricular diastolic dysfunction 01/23/2018  . Microcytic anemia 01/23/2018  . Hypoxia 01/23/2018  . 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 Gait disturbance, post-stroke 01/06/2018  . Leukocytosis   . Hypokalemia   . Vascular headache   . Hyponatremia   . Acute blood loss anemia   . Essential hypertension   . 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 Cytotoxic brain edema (HCC) 12/20/2017  . Hydrocephalus (HCC) 12/20/2017  . New cerebellar infarct  (HCC) 12/19/2017  . Encounter for central line placement   . Nausea vomiting and diarrhea     Past Surgical History:  Procedure Laterality Date  . CESAREAN SECTION  1988  . ENDOMETRIAL ABLATION  2017   occasional vaginal bleeding  . EYE SURGERY Left 1987   "cyst on eyelid removed"  . FLEXIBLE SIGMOIDOSCOPY N/A 07/28/2018   Procedure: FLEXIBLE SIGMOIDOSCOPY;  Surgeon: Kathi Der, MD;  Location: [facility] ENDOSCOPY;  Service: Gastroenterology;  Laterality: N/A;  . HEMORRHOID SURGERY N/A 07/29/2018   Procedure: HEMORRHOIDECTOMY;  Surgeon: Violeta Gelinas, MD;  Location[facility] OR;  Service: General;  Laterality: N/A;  . TUBAL LIGATION  1988    OB History   No obstetric history on file.      Home Medications    Prior to Admission medications   Medication Sig Start Date End Date Taking? Authorizing Provider  acetaminophen (TYLENOL) 325 MG tablet Take 650 mg by mouth every 6 (six) hours as needed for mild pain.    [provider]  albuterol (VENTOLIN HFA) 108 (90 Base) MCG/ACT inhaler Inhale 2 puffs into the lungs every 4 (four) hours as needed for wheezing. 03/21/17   [provider]  aspirin EC 81 MG EC tablet Take 1 tablet (81 mg total) by mouth daily. Patient taking differently: Take 81 mg by mouth at bedtime.  12/28/17   Angiulli, Mcarthur Rossetti, PA-C  atorvastatin (LIPITOR) 80  MG tablet Take 1 tablet (80 mg total) by mouth daily. 12/28/17   Angiulli, Mcarthur Rossetti, PA-C  butalbital-acetaminophen-caffeine (FIORICET, ESGIC) [phone] MG tablet Take 1 tablet by mouth every 6 (six) hours as needed for headache or migraine. 12/28/17   Angiulli, Mcarthur Rossetti, PA-C  diclofenac (VOLTAREN) 75 MG EC tablet Take 1 tablet (75 mg total) by mouth 2 (two) times daily. 12/07/19   Peyton Najjar, MD  diltiazem (CARDIZEM CD) 240 MG 24 hr capsule Take 240 mg by mouth daily. 07/15/18   [provider]  docusate sodium (COLACE) 100 MG capsule Take 1 capsule (100 mg total) by mouth 2 (two) times  daily. 07/31/18   Kathlen Mody, MD  doxycycline (VIBRAMYCIN) 100 MG capsule Take 1 capsule (100 mg total) by mouth 2 (two) times daily for 7 days. 10/10/20 10/17/20  Hall-Potvin, [place], PA-C  FLUoxetine (PROZAC) 20 MG tablet Take 2 tablets (40 mg total) by mouth 2 (two) times daily. 12/28/17   Angiulli, Mcarthur Rossetti, PA-C  folic acid (FOLVITE) 1 MG tablet Take 1 tablet (1 mg total) by mouth daily. 07/31/18   Kathlen Mody, MD  furosemide (LASIX) 20 MG tablet Take 20 mg by mouth 2 (two) times daily. 06/24/18   [provider]  gabapentin (NEURONTIN) 300 MG capsule Take 1 capsule (300 mg total) by mouth 3 (three) times daily. 07/26/20   Placido Sou, PA-C  hydrocortisone (ANUSOL-HC) 25 MG suppository Place 1 suppository (25 mg total) rectally 2 (two) times daily. 07/31/18   Kathlen Mody, MD  lidocaine (LIDODERM) 5 % Place 1 patch onto the skin daily. Remove & Discard patch within 12 hours or as directed by MD 10/25/17   Anselm Pancoast, PA-C  meclizine (ANTIVERT) 12.5 MG tablet Take 1 tablet (12.5 mg total) by mouth 3 (three) times daily as needed for dizziness. 12/28/17   Angiulli, Mcarthur Rossetti, PA-C  metFORMIN (GLUCOPHAGE) 500 MG tablet Take 1 tablet (500 mg total) by mouth 2 (two) times daily with a meal. 12/28/17 12/28/18  Angiulli, Mcarthur Rossetti, PA-C  methocarbamol (ROBAXIN) 500 MG tablet Take 1 tablet (500 mg total) by mouth 2 (two) times daily. 07/26/20   Placido Sou, PA-C  metoprolol succinate (TOPROL-XL) 25 MG 24 hr tablet Take 25 mg by mouth daily. 06/24/18   [provider]  omeprazole (PRILOSEC) 20 MG capsule Take 20 mg by mouth at bedtime.    [provider]  ondansetron (ZOFRAN) 4 MG tablet Take 4 mg by mouth every 8 (eight) hours as needed for nausea or vomiting.    [provider]  OVER THE COUNTER MEDICATION CPAP    [provider]  oxyCODONE-acetaminophen (PERCOCET) 7.5-325 MG tablet Take 1 tablet by mouth every 4 (four) hours as needed for severe pain.  09/07/19   Ward, Chase Picket, PA-C  polyethylene glycol (MIRALAX / GLYCOLAX) packet Take 17 g by mouth daily. 07/31/18   Kathlen Mody, MD  potassium chloride (K-DUR) 10 MEQ tablet Take 10 mEq by mouth daily. 06/01/18   [provider]  potassium chloride SA (K-DUR) 20 MEQ tablet Take 1 tablet (20 mEq total) by mouth 2 (two) times daily. 03/25/19   Margarita Grizzle, MD  predniSONE (STERAPRED UNI-PAK 21 TAB) 10 MG (21) TBPK tablet Take by mouth daily. Take 6 tabs by mouth daily  for 2 days, then 5 tabs for 2 days, then 4 tabs for 2 days, then 3 tabs for 2 days, 2 tabs for 2 days, then 1 tab by mouth daily for  2 days 07/26/20   Placido Sou, PA-C  SUMAtriptan (IMITREX) 100 MG tablet Take 100 mg by mouth as needed. 06/01/18   [provider]  traMADol (ULTRAM) 50 MG tablet Take 2 tablets (100 mg total) by mouth every 6 (six) hours as needed for moderate pain. 07/31/18   Kathlen Mody, MD  witch hazel-glycerin (TUCKS) pad Apply 1 application topically 3 (three) times daily. 07/31/18   Kathlen Mody, MD    Family History Family History  Problem Relation Age of Onset  . Hypertension Mother   . Hypertension Father     Social History Social History   Tobacco Use  . Smoking status: Former Games developer  . Smokeless tobacco: Never Used  . Tobacco comment: 07/27/2018 "stopped in 2011"  Vaping Use  . Vaping Use: Never used  Substance Use Topics  . Alcohol use: Not Currently  . Drug use: Never     Allergies   Asa [aspirin], Hydrocodone-acetaminophen, Ibuprofen, and Morphine and related   Review of Systems Review of Systems  Constitutional: Negative for fatigue and fever.  HENT: Negative for ear pain, sinus pain, sore throat and voice change.   Eyes: Negative for pain, redness and visual disturbance.  Respiratory: Negative for cough and shortness of breath.   Cardiovascular: Negative for chest pain and palpitations.  Gastrointestinal: Negative for abdominal pain, diarrhea and  vomiting.  Musculoskeletal: Negative for arthralgias and myalgias.  Skin: Positive for wound. Negative for rash.  Neurological: Negative for syncope and headaches.     Physical Exam Triage Vital Signs ED Triage Vitals  Enc Vitals Group     BP      Pulse      Resp      Temp      Temp src      SpO2      Weight      Height      Head Circumference      Peak Flow      Pain Score      Pain Loc      Pain Edu?      Excl. in GC?    No data found.  Updated Vital Signs BP (!) 153/91 (BP Location: Right Arm)   Pulse 94   Temp 98.5 F (36.9 C) (Oral)   Resp 18   SpO2 98%   Visual Acuity Right Eye Distance:   Left Eye Distance:   Bilateral Distance:    Right Eye Near:   Left Eye Near:    Bilateral Near:     Physical Exam Constitutional:      General: She is not in acute distress. HENT:     Head: Normocephalic and atraumatic.  Eyes:     General: No scleral icterus.    Pupils: Pupils are equal, round, and reactive to light.  Cardiovascular:     Rate and Rhythm: Normal rate.  Pulmonary:     Effort: Pulmonary effort is normal.  Musculoskeletal:        General: Swelling and tenderness present. Normal range of motion.  Skin:    Coloration: Skin is not jaundiced or pale.     Comments: Mild paronychia noted to medial aspect of right thumb.  No active discharge, fluctuance.  Exam limited secondary patient cooperation due to pain.  Neurological:     Mental Status: She is alert and oriented to person, place, and time.      UC Treatments / Results  Labs (all labs ordered are listed, but only abnormal results  are displayed) Labs Reviewed - No data to display  EKG   Radiology No results found.  Procedures Procedures (including critical care time)  Medications Ordered in UC Medications  ketorolac (TORADOL) 30 MG/ML injection 30 mg (30 mg Intramuscular Given 10/10/20 1522)  dexamethasone (DECADRON) injection 10 mg (10 mg Intramuscular Given 10/10/20 1523)     Initial Impression / Assessment and Plan / UC Course  I have reviewed the triage vital signs and the nursing notes.  Pertinent labs & imaging results that were available during my care of the patient were reviewed by me and considered in my medical decision making (see chart for details).     Exam limited secondary patient's cooperation due to pain.  Will defer I&D as there is no obvious fluctuance as well as patient's lack of cooperation due to pain.  Will start antibiotics, do warm soaks, follow-up with PCP.  Patient requesting pain medications: States she has a mild headache from the pain.  Has tolerated Toradol and Decadron while in the past: Administered in office which he tolerated well.  Return precautions discussed, pt verbalized understanding and is agreeable to plan. Final Clinical Impressions(s) / UC Diagnoses   Final diagnoses:  Paronychia of right thumb     Discharge Instructions     Keep area(s) clean and dry. Apply hot compress / towel for 5-10 minutes 3-5 times daily. Take antibiotic as prescribed with food - important to complete course. Return for worsening pain, redness, swelling, discharge, fever.    ED Prescriptions    Medication Sig Dispense Auth. Provider   doxycycline (VIBRAMYCIN) 100 MG capsule Take 1 capsule (100 mg total) by mouth 2 (two) times daily for 7 days. 14 capsule Hall-Potvin, [place], PA-C     I have reviewed the PDMP during this encounter.   Hall-Potvin, [place], [place] 10/10/20 1542

## 2020-10-10 NOTE — Discharge Instructions (Signed)
Keep area(s) clean and dry. Apply hot compress / towel for 5-10 minutes 3-5 times daily. Take antibiotic as prescribed with food - important to complete course. Return for worsening pain, redness, swelling, discharge, fever 

## 2021-01-07 ENCOUNTER — Encounter (HOSPITAL_BASED_OUTPATIENT_CLINIC_OR_DEPARTMENT_OTHER): Payer: Self-pay | Admitting: *Deleted

## 2021-01-07 ENCOUNTER — Other Ambulatory Visit: Payer: Self-pay

## 2021-01-07 ENCOUNTER — Emergency Department (HOSPITAL_BASED_OUTPATIENT_CLINIC_OR_DEPARTMENT_OTHER)
Admission: EM | Admit: 2021-01-07 | Discharge: 2021-01-07 | Disposition: A | Discharge status: Home / Self Care | Attending: Emergency Medicine | Admitting: Emergency Medicine

## 2021-01-07 ENCOUNTER — Emergency Department (HOSPITAL_BASED_OUTPATIENT_CLINIC_OR_DEPARTMENT_OTHER)

## 2021-01-07 DIAGNOSIS — Z7982 Long term (current) use of aspirin: Secondary | ICD-10-CM | POA: Insufficient documentation

## 2021-01-07 DIAGNOSIS — H538 Other visual disturbances: Secondary | ICD-10-CM | POA: Insufficient documentation

## 2021-01-07 DIAGNOSIS — M545 Low back pain, unspecified: Secondary | ICD-10-CM | POA: Insufficient documentation

## 2021-01-07 DIAGNOSIS — M25561 Pain in right knee: Secondary | ICD-10-CM | POA: Diagnosis not present

## 2021-01-07 DIAGNOSIS — Z79899 Other long term (current) drug therapy: Secondary | ICD-10-CM | POA: Insufficient documentation

## 2021-01-07 DIAGNOSIS — E119 Type 2 diabetes mellitus without complications: Secondary | ICD-10-CM | POA: Diagnosis not present

## 2021-01-07 DIAGNOSIS — Z20822 Contact with and (suspected) exposure to covid-19: Secondary | ICD-10-CM | POA: Diagnosis not present

## 2021-01-07 DIAGNOSIS — Z7984 Long term (current) use of oral hypoglycemic drugs: Secondary | ICD-10-CM | POA: Diagnosis not present

## 2021-01-07 DIAGNOSIS — Z87891 Personal history of nicotine dependence: Secondary | ICD-10-CM | POA: Insufficient documentation

## 2021-01-07 DIAGNOSIS — I10 Essential (primary) hypertension: Secondary | ICD-10-CM | POA: Diagnosis not present

## 2021-01-07 DIAGNOSIS — R519 Headache, unspecified: Secondary | ICD-10-CM | POA: Diagnosis not present

## 2021-01-07 DIAGNOSIS — J45909 Unspecified asthma, uncomplicated: Secondary | ICD-10-CM | POA: Diagnosis not present

## 2021-01-07 DIAGNOSIS — G8929 Other chronic pain: Secondary | ICD-10-CM

## 2021-01-07 DIAGNOSIS — R079 Chest pain, unspecified: Secondary | ICD-10-CM | POA: Insufficient documentation

## 2021-01-07 LAB — BASIC METABOLIC PANEL
Anion gap: 11 (ref 5–15)
BUN: 17 mg/dL (ref 6–20)
CO2: 27 mmol/L (ref 22–32)
Calcium: 9.2 mg/dL (ref 8.9–10.3)
Chloride: 98 mmol/L (ref 98–111)
Creatinine, Ser: 0.61 mg/dL (ref 0.44–1.00)
GFR, Estimated: >60 mL/min (ref >60)
Glucose, Bld: 111 mg/dL — ABNORMAL HIGH (ref 70–99)
Potassium: 3.3 mmol/L — ABNORMAL LOW (ref 3.5–5.1)
Sodium: 136 mmol/L (ref 135–145)

## 2021-01-07 LAB — CBC WITH DIFFERENTIAL/PLATELET
Abs Immature Granulocytes: 0.03 10*3/uL (ref 0.00–0.07)
Basophils Absolute: 0.1 10*3/uL (ref 0.0–0.1)
Basophils Relative: 1 %
Eosinophils Absolute: 0 10*3/uL (ref 0.0–0.5)
Eosinophils Relative: 0 %
HCT: 33.6 % — ABNORMAL LOW (ref 36.0–46.0)
Hemoglobin: 10.6 g/dL — ABNORMAL LOW (ref 12.0–15.0)
Immature Granulocytes: 0 %
Lymphocytes Relative: 19 %
Lymphs Abs: 2.2 10*3/uL (ref 0.7–4.0)
MCH: 24.8 pg — ABNORMAL LOW (ref 26.0–34.0)
MCHC: 31.5 g/dL (ref 30.0–36.0)
MCV: 78.5 fL — ABNORMAL LOW (ref 80.0–100.0)
Monocytes Absolute: 0.7 10*3/uL (ref 0.1–1.0)
Monocytes Relative: 6 %
Neutro Abs: 8.2 10*3/uL — ABNORMAL HIGH (ref 1.7–7.7)
Neutrophils Relative %: 74 %
Platelets: 437 10*3/uL — ABNORMAL HIGH (ref 150–400)
RBC: 4.28 MIL/uL (ref 3.87–5.11)
RDW: 14.3 % (ref 11.5–15.5)
WBC: 11.1 10*3/uL — ABNORMAL HIGH (ref 4.0–10.5)
nRBC: 0 % (ref 0.0–0.2)

## 2021-01-07 LAB — TROPONIN I (HIGH SENSITIVITY): Troponin I (High Sensitivity): <2 ng/L (ref <18)

## 2021-01-07 MED ORDER — PROCHLORPERAZINE EDISYLATE 10 MG/2ML IJ SOLN
10.0000 mg | Freq: Once | INTRAMUSCULAR | Status: AC
  Administered 2021-01-07: 10 mg via INTRAVENOUS
  Filled 2021-01-07: qty 2

## 2021-01-07 MED ORDER — ACETAMINOPHEN ER 650 MG PO TBCR: 650.0000 mg | EXTENDED_RELEASE_TABLET | Freq: Three times a day (TID) | ORAL | 0 refills | Status: AC | PRN

## 2021-01-07 MED ORDER — LIDOCAINE 5 % EX PTCH
2.0000 | MEDICATED_PATCH | CUTANEOUS | Status: DC
  Administered 2021-01-07: 2 via TRANSDERMAL
  Filled 2021-01-07: qty 2

## 2021-01-07 MED ORDER — POTASSIUM CHLORIDE CRYS ER 20 MEQ PO TBCR
40.0000 meq | EXTENDED_RELEASE_TABLET | Freq: Once | ORAL | Status: AC
  Administered 2021-01-07: 40 meq via ORAL
  Filled 2021-01-07: qty 2

## 2021-01-07 MED ORDER — DIPHENHYDRAMINE HCL 50 MG/ML IJ SOLN
12.5000 mg | Freq: Once | INTRAMUSCULAR | Status: AC
  Administered 2021-01-07: 12.5 mg via INTRAVENOUS
  Filled 2021-01-07: qty 1

## 2021-01-07 MED ORDER — METHOCARBAMOL 500 MG PO TABS: 500.0000 mg | ORAL_TABLET | Freq: Two times a day (BID) | ORAL | 0 refills | Status: AC

## 2021-01-07 MED ORDER — METHOCARBAMOL 500 MG PO TABS
500.0000 mg | ORAL_TABLET | Freq: Once | ORAL | Status: AC
  Administered 2021-01-07: 500 mg via ORAL
  Filled 2021-01-07: qty 1

## 2021-01-07 NOTE — ED Triage Notes (Signed)
C/o right knee pain x 1.5 years and lower back pain x " years"

## 2021-01-07 NOTE — Discharge Instructions (Addendum)
As discussed, all of your labs are reassuring today.  Follow-up with your orthopedic surgeon in regards to your back pain.  I am sending you home with pain medication and muscle relaxer.  Muscle relaxer can cause drowsiness do not drive or operate machinery while on the medication.  You may also purchase over-the-counter Voltaren gel and Lidoderm patches.  Please follow-up with your cardiologist in regards to your chest pain.  Return to the ER for new or worsening symptoms.

## 2021-01-07 NOTE — ED Provider Notes (Signed)
[facility] EMERGENCY DEPARTMENT Provider Note   CSN: [id number] Arrival date & time: 01/07/21  1551     History Chief Complaint  Patient presents with   Pain    Nicole [name] is a 55 y.o. female with a past medical history significant for history of CVA, hypertension, hyperlipidemia, diabetes, chronic low back pain, OSA on CPAP who presents to the ED due to multiple complaints.  Patient admits to acute on chronic right-sided low back pain that has been present for the past 2.5 years.  She is being followed by Dr. Ronnette Hila with orthopedics for low back pain and knee pain.  She states low back pain has worsened over the past few days.  Denies any recent injury.  Denies fever and chills.  Denies saddle paresthesias, bowel/bladder incontinence, lower extremity numbness/tingling, and lower extremity weakness.  No history of IV drug use.  Patient states pain occasionally radiates down the posterior aspect of right leg.  She has been taking over-the-counter medications with no relief.  Patient also admits to worsening right knee pain.  Pain worse with ambulation.  She admits to chronic right knee pain with no change in characteristics.  Patient also admits to intermittent, central chest pain that radiates below right breast into left shoulder x3 days.  Patient states her pain is "shooting from her low back".  Last episode was 10 AM this morning.  She notes she placed an ice pack on her chest relieved her symptoms.  Denies tobacco use.  Denies alcohol and drug use.  No previous MIs.  Denies family history of early CAD.  Patient also admits to a right-sided throbbing headache which she describes as her typical migraine.  Associated with blurry vision in the right eye which is typical.  Migraine started this morning.  Patient states headache is worse due to her worsening low back pain.  Denies changes to speech, unilateral weakness, and dizziness.  Denies head injury.  Denies worsening of her  life and worse intensity at onset.  Chart reviewed.  Patient was evaluated by cardiology on 11/13/2020. She has had numerous cardiac testing as listed below: -Event monitor from Nov, 2020 revealed sinus rhythm, PVC burden 1%. Otherwise no significant arrhythmias noted. Slowest heart rate was 67, maximum heart rate -136, average heart rate -92 -Cardiac echo from September [id number] LV systolic function with EF 60 to 65%, normal LV diastolic function, no significant valvular disease, no evidence of pulmonary hypertension. -Nuclear stress test from December 2020-suggestive of small anterior ischemia, normal LVEF 71%, no wall motion abnormalities. Previous stress test was done in March, 2018 for evaluation for CP, dyspnea and dizziness and was negative for ischemia, normal wall motion and normal LV EF 64%, normal LV size.  -Cardiac cath from January, 2021 revealed minimal CAD with 10% mid LAD stenosis and otherwise normal coronaries.   History obtained from patient and past medical records. No interpreter used during encounter.      Past Medical History:  Diagnosis Date   Anemia    Anxiety attack    Asthma    Bipolar disorder ([place])    Chronic lower back pain    scheduled to see pain management (07/27/2018)   Depression    History of blood transfusion 12/2017; 07/27/2018   Hyperlipemia    Hypertension    Migraines    "5-6/year now" (07/27/2018)   Obesity    OSA on CPAP    inconsistent use (07/27/2018)   Panic attacks    Sciatica  Stroke ([place]) 12/2017   PICA distribution; "fully recovered" (07/27/2018)   Type II diabetes mellitus [facility])     Patient Active Problem List   Diagnosis Date Noted   Symptomatic anemia 07/27/2018   Hematochezia 07/27/2018   Acute respiratory failure with hypoxia ([place]) 01/23/2018   Migraines 01/23/2018   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 Dec 19, 2017 01/23/2018   Obesity 01/23/2018    Hyperlipemia 01/23/2018   Diabetes mellitus type 2 in obese ([place]) 01/23/2018   OSA on CPAP 01/23/2018   Acute hypokalemia 01/23/2018   Left ventricular diastolic dysfunction 77/82/4235   Microcytic anemia 01/23/2018   Hypoxia 01/23/2018   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 01/06/2018   Gait disturbance, post-stroke 01/06/2018   Leukocytosis    Hypokalemia    Vascular headache    Hyponatremia    Acute blood loss anemia    Essential hypertension    Cerebellar cerebrovascular accident (CVA) without late effect 12/23/2017   Cytotoxic brain edema ([place]) 12/20/2017   Hydrocephalus ([place]) 12/20/2017   New cerebellar infarct ([place]) 12/19/2017   Encounter for central line placement    Nausea vomiting and diarrhea     Past Surgical History:  Procedure Laterality Date   [place]  2017   occasional vaginal bleeding   EYE SURGERY Left 1987   "cyst on eyelid removed"   FLEXIBLE SIGMOIDOSCOPY N/A 07/28/2018   Procedure: FLEXIBLE SIGMOIDOSCOPY;  Surgeon: Otis Brace, MD;  Location: [facility];  Service: Gastroenterology;  Laterality: N/A;   HEMORRHOID SURGERY N/A 07/29/2018   Procedure: HEMORRHOIDECTOMY;  Surgeon: Georganna Skeans, MD;  Location: [facility];  Service: General;  Laterality: N/A;   TUBAL LIGATION  1988     OB History   No obstetric history on file.     Family History  Problem Relation Age of Onset   Hypertension Mother    Hypertension Father     Social History   Tobacco Use   Smoking status: Former Smoker   Smokeless tobacco: Never Used   Tobacco comment: 07/27/2018 "stopped in 2011"  Vaping Use   Vaping Use: Never used  Substance Use Topics   Alcohol use: Not Currently   Drug use: Never    Home Medications Prior to Admission medications   Medication Sig Start Date End Date Taking? Authorizing Provider  acetaminophen (TYLENOL 8 HOUR) 650 MG CR tablet Take 1 tablet (650 mg total) by mouth  every 8 (eight) hours as needed for pain. 01/07/21  Yes Humza Tallerico, Druscilla Brownie, PA-C  methocarbamol (ROBAXIN) 500 MG tablet Take 1 tablet (500 mg total) by mouth 2 (two) times daily. 01/07/21  Yes Hriday Stai, Druscilla Brownie, PA-C  acetaminophen (TYLENOL) 325 MG tablet Take 650 mg by mouth every 6 (six) hours as needed for mild pain.    [provider]  albuterol (VENTOLIN HFA) 108 (90 Base) MCG/ACT inhaler Inhale 2 puffs into the lungs every 4 (four) hours as needed for wheezing. 03/21/17   [provider]  aspirin EC 81 MG EC tablet Take 1 tablet (81 mg total) by mouth daily. Patient taking differently: Take 81 mg by mouth at bedtime.  12/28/17   Angiulli, Lavon Paganini, PA-C  atorvastatin (LIPITOR) 80 MG tablet Take 1 tablet (80 mg total) by mouth daily. 12/28/17   Angiulli, Lavon Paganini, PA-C  butalbital-acetaminophen-caffeine (FIORICET, ESGIC) [phone] MG tablet Take 1 tablet by mouth every 6 (six) hours as needed for headache or migraine. 12/28/17   Snyder,  Lavon Paganini, PA-C  diclofenac (VOLTAREN) 75 MG EC tablet Take 1 tablet (75 mg total) by mouth 2 (two) times daily. 12/07/19   Posey Boyer, MD  diltiazem (CARDIZEM CD) 240 MG 24 hr capsule Take 240 mg by mouth daily. 07/15/18   [provider]  docusate sodium (COLACE) 100 MG capsule Take 1 capsule (100 mg total) by mouth 2 (two) times daily. 07/31/18   Hosie Poisson, MD  FLUoxetine (PROZAC) 20 MG tablet Take 2 tablets (40 mg total) by mouth 2 (two) times daily. 12/28/17   Angiulli, Lavon Paganini, PA-C  folic acid (FOLVITE) 1 MG tablet Take 1 tablet (1 mg total) by mouth daily. 07/31/18   Hosie Poisson, MD  furosemide (LASIX) 20 MG tablet Take 20 mg by mouth 2 (two) times daily. 06/24/18   [provider]  gabapentin (NEURONTIN) 300 MG capsule Take 1 capsule (300 mg total) by mouth 3 (three) times daily. 07/26/20   Rayna Sexton, PA-C  hydrocortisone (ANUSOL-HC) 25 MG suppository Place 1 suppository (25 mg total) rectally 2 (two) times  daily. 07/31/18   Hosie Poisson, MD  lidocaine (LIDODERM) 5 % Place 1 patch onto the skin daily. Remove & Discard patch within 12 hours or as directed by MD 10/25/17   Lorayne Bender, PA-C  meclizine (ANTIVERT) 12.5 MG tablet Take 1 tablet (12.5 mg total) by mouth 3 (three) times daily as needed for dizziness. 12/28/17   Angiulli, Lavon Paganini, PA-C  metFORMIN (GLUCOPHAGE) 500 MG tablet Take 1 tablet (500 mg total) by mouth 2 (two) times daily with a meal. 12/28/17 12/28/18  Angiulli, Lavon Paganini, PA-C  methocarbamol (ROBAXIN) 500 MG tablet Take 1 tablet (500 mg total) by mouth 2 (two) times daily. 07/26/20   Rayna Sexton, PA-C  metoprolol succinate (TOPROL-XL) 25 MG 24 hr tablet Take 25 mg by mouth daily. 06/24/18   [provider]  omeprazole (PRILOSEC) 20 MG capsule Take 20 mg by mouth at bedtime.    [provider]  ondansetron (ZOFRAN) 4 MG tablet Take 4 mg by mouth every 8 (eight) hours as needed for nausea or vomiting.    [provider]  OVER THE COUNTER MEDICATION CPAP    [provider]  oxyCODONE-acetaminophen (PERCOCET) 7.5-325 MG tablet Take 1 tablet by mouth every 4 (four) hours as needed for severe pain. 09/07/19   Ward, Ozella Almond, PA-C  polyethylene glycol (MIRALAX / GLYCOLAX) packet Take 17 g by mouth daily. 07/31/18   Hosie Poisson, MD  potassium chloride (K-DUR) 10 MEQ tablet Take 10 mEq by mouth daily. 06/01/18   [provider]  potassium chloride SA (K-DUR) 20 MEQ tablet Take 1 tablet (20 mEq total) by mouth 2 (two) times daily. 03/25/19   Pattricia Boss, MD  predniSONE (STERAPRED UNI-PAK 21 TAB) 10 MG (21) TBPK tablet Take by mouth daily. Take 6 tabs by mouth daily  for 2 days, then 5 tabs for 2 days, then 4 tabs for 2 days, then 3 tabs for 2 days, 2 tabs for 2 days, then 1 tab by mouth daily for 2 days 07/26/20   Rayna Sexton, PA-C  SUMAtriptan (IMITREX) 100 MG tablet Take 100 mg by mouth as needed. 06/01/18   [provider]   traMADol (ULTRAM) 50 MG tablet Take 2 tablets (100 mg total) by mouth every 6 (six) hours as needed for moderate pain. 07/31/18   Hosie Poisson, MD  witch hazel-glycerin (TUCKS) pad Apply 1 application topically 3 (three) times daily. 07/31/18  Hosie Poisson, MD    Allergies    Asa [aspirin], Hydrocodone-acetaminophen, Ibuprofen, and Morphine and related  Review of Systems   Review of Systems  Constitutional: Negative for chills and fever.  Eyes: Positive for visual disturbance.  Respiratory: Negative for shortness of breath.   Cardiovascular: Positive for chest pain. Negative for leg swelling.  Gastrointestinal: Negative for abdominal pain, nausea and vomiting.  Musculoskeletal: Positive for arthralgias and back pain.  Neurological: Positive for headaches.  All other systems reviewed and are negative.   Physical Exam Updated Vital Signs BP 131/70    Pulse 100    Temp 98.3 F (36.8 C) (Oral)    Resp 18    Ht '5\' 3"'  (1.6 m)    Wt 131.1 kg    SpO2 99%    BMI 51.19 kg/m   Physical Exam Vitals and nursing note reviewed.  Constitutional:      General: She is not in acute distress. HENT:     Head: Normocephalic.  Eyes:     Pupils: Pupils are equal, round, and reactive to light.  Cardiovascular:     Rate and Rhythm: Normal rate and regular rhythm.     Pulses: Normal pulses.     Heart sounds: Normal heart sounds. No murmur heard. No friction rub. No gallop.   Pulmonary:     Effort: Pulmonary effort is normal.     Breath sounds: Normal breath sounds.  Abdominal:     General: Abdomen is flat. Bowel sounds are normal. There is no distension.     Palpations: Abdomen is soft.     Tenderness: There is no abdominal tenderness. There is no guarding or rebound.  Musculoskeletal:     Cervical back: Neck supple.     Comments: No thoracic midline tenderness. Mild lumbar midline tenderness with significant on right lumbar paraspinal region.  Lower extremities neurovascularly intact.   Patient able to ambulate in the ED without difficulty.  Tenderness on medial aspect of right knee.  No edema, erythema, or warmth.  Full range of motion of right knee.  Patient able to ambulate without difficulty.  Pedal pulses and sensation intact.  Skin:    General: Skin is warm and dry.  Neurological:     General: No focal deficit present.     Comments: Speech is clear, able to follow commands CN III-XII intact Normal strength in upper and lower extremities bilaterally including dorsiflexion and plantar flexion, strong and equal grip strength Sensation grossly intact throughout Moves extremities without ataxia, coordination intact No pronator drift Ambulates without difficulty  Psychiatric:        Mood and Affect: Mood normal.        Behavior: Behavior normal.     ED Results / Procedures / Treatments   Labs (all labs ordered are listed, but only abnormal results are displayed) Labs Reviewed  CBC WITH DIFFERENTIAL/PLATELET - Abnormal; Notable for the following components:      Result Value   WBC 11.1 (*)    Hemoglobin 10.6 (*)    HCT 33.6 (*)    MCV 78.5 (*)    MCH 24.8 (*)    Platelets 437 (*)    Neutro Abs 8.2 (*)    All other components within normal limits  BASIC METABOLIC PANEL - Abnormal; Notable for the following components:   Potassium 3.3 (*)    Glucose, Bld 111 (*)    All other components within normal limits  SARS CORONAVIRUS 2 (TAT 6-24 HRS)  TROPONIN I (  HIGH SENSITIVITY)    EKG EKG Interpretation  Date/Time:  Wednesday January 07 2021 17:38:17 EST Ventricular Rate:  91 PR Interval:  202 QRS Duration: 80 QT Interval:  376 QTC Calculation: 462 R Axis:   63 Text Interpretation: Normal sinus rhythm Cannot rule out Anterior infarct , age undetermined Abnormal ECG No significant change since last tracing Confirmed by Dorie Rank [phone]) on 01/07/2021 5:51:57 PM   Radiology DG Chest Portable 1 View  Result Date: 01/07/2021 CLINICAL DATA:  Right knee  pain for 1.5 years, chest pain EXAM: PORTABLE CHEST 1 VIEW COMPARISON:  03/25/2019 FINDINGS: The heart size and mediastinal contours are within normal limits. Both lungs are clear. The visualized skeletal structures are unremarkable. IMPRESSION: No active disease. Electronically Signed   By: Randa Ngo M.D.   On: 01/07/2021 17:41    Procedures Procedures   Medications Ordered in ED Medications  lidocaine (LIDODERM) 5 % 2 patch (2 patches Transdermal Patch Applied 01/07/21 1726)  potassium chloride SA (KLOR-CON) CR tablet 40 mEq (has no administration in time range)  prochlorperazine (COMPAZINE) injection 10 mg (10 mg Intravenous Given 01/07/21 1733)  diphenhydrAMINE (BENADRYL) injection 12.5 mg (12.5 mg Intravenous Given 01/07/21 1729)  methocarbamol (ROBAXIN) tablet 500 mg (500 mg Oral Given 01/07/21 1807)    ED Course  I have reviewed the triage vital signs and the nursing notes.  Pertinent labs & imaging results that were available during my care of the patient were reviewed by me and considered in my medical decision making (see chart for details).  Clinical Course as of 01/07/21 1855  Wed Jan 07, 2021  1754 WBC(!): 11.1 [CA]  1754 Hemoglobin(!): 10.6 [CA]    Clinical Course User Index [CA] Suzy Bouchard, PA-C   MDM Rules/Calculators/A&P                         55 year old female presents to the ED due to multiple complaints of acute on chronic low back pain, right knee pain, right-sided headache, and chest pain.  Low back pain and knee pain chronic in nature with no new injuries.  Patient denies saddle paresthesias, bowel/bladder cons, lower extremity numbness/tingling, lower extremity weakness.  No history of IV drug use.  Patient denies fever and chills.  Patient had an x-ray of her right knee on 12/07/2019 which demonstrated diffuse dry compartment degenerative changes.  Upon arrival, stable vitals.  Patient in no acute distress and nontoxic-appearing.  Physical exam  reassuring.  No thoracic midline tenderness.  Mild lumbar midline tenderness no significant right-sided lumbar paraspinal region.  Normal neurological exam.  Doubt CVA.  Routine labs ordered out electrolyte abnormalities and infectious etiology.  Troponin, chest x-ray, and EKG to rule out cardiac etiology of chest pain.  Chest pain appears atypical in nature.  COVID test due to a myriad of symptoms.   Troponin normal.  Delta troponin not warranted at this time given last episode of chest pain was at 10 AM this morning.  Low suspicion for ACS.  CBC significant for mild leukocytosis 11.1, anemia with hemoglobin at 10.6, thrombocytosis at 437.  BMP significant for hypokalemia at 3.3, but otherwise reassuring.  Normal renal function.  EKG personally reviewed which demonstrates normal sinus rhythm with no changes from previous EKGs.  Chest x-ray personally reviewed which is negative for signs of pneumonia, pneumothorax, or widened mediastinum.  5:58 PM reassessed patient at bedside who notes improvement in migraine after Compazine and Benadryl.  Patient continues to  note right-sided low back pain.  Offered imaging however, patient deferred. Will give Robaxin for added pain relief. Given chronic nature of pain, will hold opioids at this time.  6:51 PM reassessed patient at bedside and notes resolution in headache.  Suspect symptoms related to patient's normal migraines.  Low suspicion for CVA given normal neurological exam.  Patient notes low back pain has improved after Robaxin.  Low suspicion for cauda equina and central cord compression. No infectious symptoms to suggest epidural abscess or discitis.  Instructed patient to follow-up with her orthopedic surgeon for further evaluation.  Patient discharged with Robaxin and naproxen. Strict ED precautions discussed with patient. Patient states understanding and agrees to plan. Patient discharged home in no acute distress and stable vitals.  Final Clinical  Impression(s) / ED Diagnoses Final diagnoses:  Chronic bilateral low back pain without sciatica  Chronic pain of right knee  Acute nonintractable headache, unspecified headache type  Nonspecific chest pain    Rx / DC Orders ED Discharge Orders         Ordered    methocarbamol (ROBAXIN) 500 MG tablet  2 times daily        01/07/21 1855    acetaminophen (TYLENOL 8 HOUR) 650 MG CR tablet  Every 8 hours PRN        01/07/21 1855           Karie Kirks 01/07/21 2333    Dorie Rank, MD 01/08/21 1034

## 2021-01-08 LAB — SARS CORONAVIRUS 2 (TAT 6-24 HRS): SARS Coronavirus 2: NEGATIVE

## 2021-01-14 ENCOUNTER — Encounter: Payer: Self-pay | Admitting: Emergency Medicine

## 2021-01-14 ENCOUNTER — Other Ambulatory Visit: Payer: Self-pay

## 2021-01-14 ENCOUNTER — Ambulatory Visit: Admission: EM | Admit: 2021-01-14 | Discharge: 2021-01-14 | Discharge status: Against Medical Advice

## 2021-01-14 DIAGNOSIS — Z5321 Procedure and treatment not carried out due to patient leaving prior to being seen by health care provider: Secondary | ICD-10-CM

## 2021-01-14 NOTE — ED Triage Notes (Signed)
Patient c/o ringing and itching  X 1 month.   Patient states "it feels like something is crawling then itching, crawling then itching".   Patient denies changes in hearing.   Patient endorses some neck pain.   Patient endorses being given muscle relaxer's for neck with no relief of symptoms.

## 2021-04-29 ENCOUNTER — Emergency Department (HOSPITAL_COMMUNITY): Admission: EM | Admit: 2021-04-29 | Discharge: 2021-04-29 | Discharge status: Against Medical Advice

## 2021-04-29 NOTE — ED Notes (Signed)
This tech approached patient to check vitals and patient informed tech she was not going to wait she was going to go home and put ice on her foot. Patient left the lobby.

## 2021-04-30 ENCOUNTER — Encounter (HOSPITAL_COMMUNITY): Payer: Self-pay

## 2021-04-30 ENCOUNTER — Emergency Department (HOSPITAL_COMMUNITY)

## 2021-04-30 ENCOUNTER — Emergency Department (HOSPITAL_COMMUNITY)
Admission: EM | Admit: 2021-04-30 | Discharge: 2021-04-30 | Disposition: A | Discharge status: Home / Self Care | Attending: Emergency Medicine | Admitting: Emergency Medicine

## 2021-04-30 ENCOUNTER — Other Ambulatory Visit: Payer: Self-pay

## 2021-04-30 DIAGNOSIS — E119 Type 2 diabetes mellitus without complications: Secondary | ICD-10-CM | POA: Insufficient documentation

## 2021-04-30 DIAGNOSIS — Z7982 Long term (current) use of aspirin: Secondary | ICD-10-CM | POA: Insufficient documentation

## 2021-04-30 DIAGNOSIS — I10 Essential (primary) hypertension: Secondary | ICD-10-CM | POA: Diagnosis not present

## 2021-04-30 DIAGNOSIS — Z79899 Other long term (current) drug therapy: Secondary | ICD-10-CM | POA: Diagnosis not present

## 2021-04-30 DIAGNOSIS — J45909 Unspecified asthma, uncomplicated: Secondary | ICD-10-CM | POA: Insufficient documentation

## 2021-04-30 DIAGNOSIS — Z7984 Long term (current) use of oral hypoglycemic drugs: Secondary | ICD-10-CM | POA: Insufficient documentation

## 2021-04-30 DIAGNOSIS — M25561 Pain in right knee: Secondary | ICD-10-CM | POA: Diagnosis present

## 2021-04-30 DIAGNOSIS — Y92410 Unspecified street and highway as the place of occurrence of the external cause: Secondary | ICD-10-CM | POA: Diagnosis not present

## 2021-04-30 DIAGNOSIS — Z87891 Personal history of nicotine dependence: Secondary | ICD-10-CM | POA: Diagnosis not present

## 2021-04-30 MED ORDER — CYCLOBENZAPRINE HCL 10 MG PO TABS: 10.0000 mg | ORAL_TABLET | Freq: Two times a day (BID) | ORAL | 0 refills | Status: AC | PRN

## 2021-04-30 MED ORDER — OXYCODONE-ACETAMINOPHEN 5-325 MG PO TABS: 1.0000 | ORAL_TABLET | Freq: Three times a day (TID) | ORAL | 0 refills | Status: AC | PRN

## 2021-04-30 MED ORDER — OXYCODONE-ACETAMINOPHEN 5-325 MG PO TABS: 1.0000 | ORAL_TABLET | Freq: Four times a day (QID) | ORAL | 0 refills | Status: DC | PRN
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
## 2021-04-30 NOTE — Discharge Instructions (Addendum)
You have been seen here for right knee pain.  Given you prescription for oxycodone and a muscle relaxer these medications can make you drowsy do not consume alcohol or operate heavy machinery when taking this medication.  Oxycodone has Tylenol in it do not take Tylenol when taking this medication.  I recommend taking over-the-counter pain medications like ibuprofen and/or Tylenol every 6 as needed.  Please follow dosage and on the back of bottle.  I also recommend applying heat to the area and stretching out the muscles as this will help decrease stiffness and pain.    Please follow-up with orthopedic surgery for further evaluation.  Come back to the emergency department if you develop chest pain, shortness of breath, severe abdominal pain, uncontrolled nausea, vomiting, diarrhea.

## 2021-10-06 ENCOUNTER — Ambulatory Visit
Admission: EM | Admit: 2021-10-06 | Discharge: 2021-10-06 | Disposition: A | Discharge status: Home / Self Care | Attending: Physician Assistant | Admitting: Physician Assistant

## 2021-10-06 ENCOUNTER — Other Ambulatory Visit: Payer: Self-pay

## 2021-10-06 DIAGNOSIS — J020 Streptococcal pharyngitis: Secondary | ICD-10-CM

## 2021-10-06 LAB — POCT RAPID STREP A (OFFICE): Rapid Strep A Screen: POSITIVE — AB

## 2021-10-06 LAB — POCT INFLUENZA A/B
Influenza A, POC: NEGATIVE
Influenza B, POC: NEGATIVE

## 2021-10-06 MED ORDER — AMOXICILLIN 500 MG PO CAPS: 500.0000 mg | ORAL_CAPSULE | Freq: Three times a day (TID) | ORAL | 0 refills | Status: AC

## 2021-10-06 NOTE — ED Provider Notes (Signed)
[name]    CSN: [id number] Arrival date & time: 10/06/21  1650      History   Chief Complaint Chief Complaint  Patient presents with   Sore Throat    HPI Nicole [name] is a 55 y.o. female.   Patient here today for evaluation of sore throat, bilateral ear pain and fever that started 2 days ago.  She reports that she is also had some headache as well.  She reports her pain is a 12 on a 0-10 pain scale.  She has tried Tylenol without significant relief.  The history is provided by the patient.  Sore Throat Pertinent negatives include no abdominal pain and no shortness of breath.   Past Medical History:  Diagnosis Date   Anemia    Anxiety attack    Asthma    Bipolar disorder ([place])    Chronic lower back pain    scheduled to see pain management (07/27/2018)   Depression    History of blood transfusion 12/2017; 07/27/2018   Hyperlipemia    Hypertension    Migraines    "5-6/year now" (07/27/2018)   Obesity    OSA on CPAP    inconsistent use (07/27/2018)   Panic attacks    Sciatica    Stroke ([place]) 12/2017   PICA distribution; "fully recovered" (07/27/2018)   Type II diabetes mellitus ([place])     Patient Active Problem List   Diagnosis Date Noted   Symptomatic anemia 07/27/2018   Hematochezia 07/27/2018   Acute respiratory failure with hypoxia ([place]) 01/23/2018   Migraines 01/23/2018   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 Dec 19, 2017 01/23/2018   Obesity 01/23/2018   Hyperlipemia 01/23/2018   Diabetes mellitus type 2 in obese ([place]) 01/23/2018   OSA on CPAP 01/23/2018   Acute hypokalemia 01/23/2018   Left ventricular diastolic dysfunction [id number]   Microcytic anemia 01/23/2018   Hypoxia 01/23/2018   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 01/06/2018   Gait disturbance, post-stroke 01/06/2018   Leukocytosis    Hypokalemia    Vascular headache    Hyponatremia    Acute blood loss anemia    Essential  hypertension    Cerebellar cerebrovascular accident (CVA) without late effect 12/23/2017   Cytotoxic brain edema ([place]) 12/20/2017   Hydrocephalus ([place]) 12/20/2017   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   Encounter for central line placement    Nausea vomiting and diarrhea     Past Surgical History:  Procedure Laterality Date   [place]  2017   occasional vaginal bleeding   EYE SURGERY Left 1987   "cyst on eyelid removed"   FLEXIBLE SIGMOIDOSCOPY N/A 07/28/2018   Procedure: FLEXIBLE SIGMOIDOSCOPY;  Surgeon: Otis Brace, MD;  Location: [facility];  Service: Gastroenterology;  Laterality: N/A;   HEMORRHOID SURGERY N/A 07/29/2018   Procedure: HEMORRHOIDECTOMY;  Surgeon: Georganna Skeans, MD;  Location: [facility];  Service: General;  Laterality: N/A;   TUBAL LIGATION  1988    OB History   No obstetric history on file.      Home Medications    Prior to Admission medications   Medication Sig Start Date End Date Taking? Authorizing Provider  amoxicillin (AMOXIL) 500 MG capsule Take 1 capsule (500 mg total) by mouth 3 (three) times daily. 10/06/21  Yes Francene Finders, PA-C  acetaminophen (TYLENOL 8 HOUR) 650 MG CR tablet Take 1 tablet (650 mg total) by mouth every 8 (eight) hours as needed  for pain. 01/07/21   Mannie Stabile, PA-C  acetaminophen (TYLENOL) 325 MG tablet Take 650 mg by mouth every 6 (six) hours as needed for mild pain.    [provider]  albuterol (VENTOLIN HFA) 108 (90 Base) MCG/ACT inhaler Inhale 2 puffs into the lungs every 4 (four) hours as needed for wheezing. 03/21/17   [provider]  aspirin EC 81 MG EC tablet Take 1 tablet (81 mg total) by mouth daily. Patient taking differently: Take 81 mg by mouth at bedtime. 12/28/17   Angiulli, Mcarthur Rossetti, PA-C  atorvastatin (LIPITOR) 80 MG tablet Take 1 tablet (80 mg total) by mouth daily. 12/28/17   Angiulli, Mcarthur Rossetti, PA-C  butalbital-acetaminophen-caffeine  (FIORICET, ESGIC) [phone] MG tablet Take 1 tablet by mouth every 6 (six) hours as needed for headache or migraine. 12/28/17   Angiulli, Mcarthur Rossetti, PA-C  cyclobenzaprine (FLEXERIL) 10 MG tablet Take 1 tablet (10 mg total) by mouth 2 (two) times daily as needed for muscle spasms. 04/30/21   Carroll Sage, PA-C  diclofenac (VOLTAREN) 75 MG EC tablet Take 1 tablet (75 mg total) by mouth 2 (two) times daily. 12/07/19   Peyton Najjar, MD  diltiazem (CARDIZEM CD) 240 MG 24 hr capsule Take 240 mg by mouth daily. 07/15/18   [provider]  docusate sodium (COLACE) 100 MG capsule Take 1 capsule (100 mg total) by mouth 2 (two) times daily. 07/31/18   Kathlen Mody, MD  FLUoxetine (PROZAC) 20 MG tablet Take 2 tablets (40 mg total) by mouth 2 (two) times daily. 12/28/17   Angiulli, Mcarthur Rossetti, PA-C  folic acid (FOLVITE) 1 MG tablet Take 1 tablet (1 mg total) by mouth daily. 07/31/18   Kathlen Mody, MD  furosemide (LASIX) 20 MG tablet Take 20 mg by mouth 2 (two) times daily. 06/24/18   [provider]  gabapentin (NEURONTIN) 300 MG capsule Take 1 capsule (300 mg total) by mouth 3 (three) times daily. 07/26/20   Placido Sou, PA-C  hydrocortisone (ANUSOL-HC) 25 MG suppository Place 1 suppository (25 mg total) rectally 2 (two) times daily. 07/31/18   Kathlen Mody, MD  lidocaine (LIDODERM) 5 % Place 1 patch onto the skin daily. Remove & Discard patch within 12 hours or as directed by MD 10/25/17   Anselm Pancoast, PA-C  meclizine (ANTIVERT) 12.5 MG tablet Take 1 tablet (12.5 mg total) by mouth 3 (three) times daily as needed for dizziness. 12/28/17   Angiulli, Mcarthur Rossetti, PA-C  metFORMIN (GLUCOPHAGE) 500 MG tablet Take 1 tablet (500 mg total) by mouth 2 (two) times daily with a meal. 12/28/17 12/28/18  Angiulli, Mcarthur Rossetti, PA-C  methocarbamol (ROBAXIN) 500 MG tablet Take 1 tablet (500 mg total) by mouth 2 (two) times daily. 07/26/20   Placido Sou, PA-C  methocarbamol (ROBAXIN) 500 MG tablet Take 1  tablet (500 mg total) by mouth 2 (two) times daily. 01/07/21   Mannie Stabile, PA-C  metoprolol succinate (TOPROL-XL) 25 MG 24 hr tablet Take 25 mg by mouth daily. 06/24/18   [provider]  omeprazole (PRILOSEC) 20 MG capsule Take 20 mg by mouth at bedtime.    [provider]  ondansetron (ZOFRAN) 4 MG tablet Take 4 mg by mouth every 8 (eight) hours as needed for nausea or vomiting.    [provider]  OVER THE COUNTER MEDICATION CPAP    [provider]  polyethylene glycol (MIRALAX / GLYCOLAX) packet Take 17 g by mouth daily. 07/31/18   Blake Divine,  Vijaya, MD  potassium chloride (K-DUR) 10 MEQ tablet Take 10 mEq by mouth daily. 06/01/18   [provider]  potassium chloride SA (K-DUR) 20 MEQ tablet Take 1 tablet (20 mEq total) by mouth 2 (two) times daily. 03/25/19   Pattricia Boss, MD  predniSONE (STERAPRED UNI-PAK 21 TAB) 10 MG (21) TBPK tablet Take by mouth daily. Take 6 tabs by mouth daily  for 2 days, then 5 tabs for 2 days, then 4 tabs for 2 days, then 3 tabs for 2 days, 2 tabs for 2 days, then 1 tab by mouth daily for 2 days 07/26/20   Rayna Sexton, PA-C  SUMAtriptan (IMITREX) 100 MG tablet Take 100 mg by mouth as needed. 06/01/18   [provider]  traMADol (ULTRAM) 50 MG tablet Take 2 tablets (100 mg total) by mouth every 6 (six) hours as needed for moderate pain. 07/31/18   Hosie Poisson, MD  witch hazel-glycerin (TUCKS) pad Apply 1 application topically 3 (three) times daily. 07/31/18   Hosie Poisson, MD    Family History Family History  Problem Relation Age of Onset   Hypertension Mother    Hypertension Father     Social History Social History   Tobacco Use   Smoking status: Former   Smokeless tobacco: Never   Tobacco comments:    07/27/2018 "stopped in 2011"  Vaping Use   Vaping Use: Never used  Substance Use Topics   Alcohol use: Not Currently   Drug use: Never     Allergies   Asa [aspirin],  Hydrocodone-acetaminophen, Ibuprofen, and Morphine and related   Review of Systems Review of Systems  Constitutional:  Positive for fever. Negative for chills.  HENT:  Positive for congestion, ear pain and sore throat.   Eyes:  Negative for discharge and redness.  Respiratory:  Positive for cough. Negative for shortness of breath and wheezing.   Gastrointestinal:  Negative for abdominal pain, diarrhea, nausea and vomiting.    Physical Exam Triage Vital Signs ED Triage Vitals  Enc Vitals Group     BP 10/06/21 1708 116/79     Pulse Rate 10/06/21 1708 (!) 102     Resp 10/06/21 1708 18     Temp 10/06/21 1708 98 F (36.7 C)     Temp Source 10/06/21 1708 Oral     SpO2 10/06/21 1708 94 %     Weight --      Height --      Head Circumference --      Peak Flow --      Pain Score 10/06/21 1716 10     Pain Loc --      Pain Edu? --      Excl. in [place]? --    No data found.  Updated Vital Signs BP 116/79 (BP Location: Left Arm)   Pulse (!) 102   Temp 98 F (36.7 C) (Oral)   Resp 18   SpO2 94%      Physical Exam Vitals and nursing note reviewed.  Constitutional:      General: She is not in acute distress.    Appearance: Normal appearance. She is not ill-appearing.  HENT:     Head: Normocephalic and atraumatic.     Right Ear: Tympanic membrane normal.     Left Ear: Tympanic membrane normal.     Nose: Congestion present.     Mouth/Throat:     Mouth: Mucous membranes are moist.     Pharynx: Posterior oropharyngeal erythema present. No oropharyngeal  exudate.  Eyes:     Conjunctiva/sclera: Conjunctivae normal.  Cardiovascular:     Rate and Rhythm: Normal rate and regular rhythm.     Heart sounds: Normal heart sounds. No murmur heard. Pulmonary:     Effort: Pulmonary effort is normal. No respiratory distress.     Breath sounds: Normal breath sounds. No wheezing, rhonchi or rales.  Skin:    General: Skin is warm and dry.  Neurological:     Mental Status: She is alert.   Psychiatric:        Mood and Affect: Mood normal.        Thought Content: Thought content normal.     UC Treatments / Results  Labs (all labs ordered are listed, but only abnormal results are displayed) Labs Reviewed  POCT RAPID STREP A (OFFICE) - Abnormal; Notable for the following components:      Result Value   Rapid Strep A Screen Positive (*)    All other components within normal limits  POCT INFLUENZA A/B    EKG   Radiology No results found.  Procedures Procedures (including critical care time)  Medications Ordered in UC Medications - No data to display  Initial Impression / Assessment and Plan / UC Course  I have reviewed the triage vital signs and the nursing notes.  Pertinent labs & imaging results that were available during my care of the patient were reviewed by me and considered in my medical decision making (see chart for details).  Strep test positive.  Will treat with amoxicillin and recommended continue Tylenol if needed for pain.  Encouraged follow-up if symptoms fail to improve or worsen.  Final Clinical Impressions(s) / UC Diagnoses   Final diagnoses:  Strep pharyngitis   Discharge Instructions   None    ED Prescriptions     Medication Sig Dispense Auth. Provider   amoxicillin (AMOXIL) 500 MG capsule Take 1 capsule (500 mg total) by mouth 3 (three) times daily. 21 capsule Francene Finders, PA-C      PDMP not reviewed this encounter.   Francene Finders, PA-C 10/06/21 1821

## 2021-10-06 NOTE — ED Triage Notes (Signed)
Pt c/o cough, congestion, sore throat, fever, bilateral ear pain, and headache since Sunday.

## 2021-11-04 ENCOUNTER — Other Ambulatory Visit: Payer: Self-pay

## 2021-11-04 ENCOUNTER — Emergency Department (HOSPITAL_BASED_OUTPATIENT_CLINIC_OR_DEPARTMENT_OTHER)
Admission: EM | Admit: 2021-11-04 | Discharge: 2021-11-04 | Disposition: A | Discharge status: Home / Self Care | Attending: Emergency Medicine | Admitting: Emergency Medicine

## 2021-11-04 ENCOUNTER — Ambulatory Visit: Admission: EM | Admit: 2021-11-04 | Discharge: 2021-11-04 | Discharge status: Absent without leave

## 2021-11-04 ENCOUNTER — Encounter (HOSPITAL_BASED_OUTPATIENT_CLINIC_OR_DEPARTMENT_OTHER): Payer: Self-pay

## 2021-11-04 DIAGNOSIS — Z87891 Personal history of nicotine dependence: Secondary | ICD-10-CM | POA: Diagnosis not present

## 2021-11-04 DIAGNOSIS — J45909 Unspecified asthma, uncomplicated: Secondary | ICD-10-CM | POA: Diagnosis not present

## 2021-11-04 DIAGNOSIS — Z7984 Long term (current) use of oral hypoglycemic drugs: Secondary | ICD-10-CM | POA: Insufficient documentation

## 2021-11-04 DIAGNOSIS — E119 Type 2 diabetes mellitus without complications: Secondary | ICD-10-CM | POA: Insufficient documentation

## 2021-11-04 DIAGNOSIS — Z7982 Long term (current) use of aspirin: Secondary | ICD-10-CM | POA: Diagnosis not present

## 2021-11-04 DIAGNOSIS — R519 Headache, unspecified: Secondary | ICD-10-CM

## 2021-11-04 DIAGNOSIS — I10 Essential (primary) hypertension: Secondary | ICD-10-CM | POA: Diagnosis not present

## 2021-11-04 DIAGNOSIS — Z79899 Other long term (current) drug therapy: Secondary | ICD-10-CM | POA: Diagnosis not present

## 2021-11-04 MED ORDER — KETOROLAC TROMETHAMINE 30 MG/ML IJ SOLN
30.0000 mg | Freq: Once | INTRAMUSCULAR | Status: AC
  Administered 2021-11-04: 17:00:00 30 mg via INTRAVENOUS
  Filled 2021-11-04: qty 1

## 2021-11-04 MED ORDER — SODIUM CHLORIDE 0.9 % IV BOLUS
1000.0000 mL | Freq: Once | INTRAVENOUS | Status: AC
  Administered 2021-11-04: 17:00:00 1000 mL via INTRAVENOUS

## 2021-11-04 MED ORDER — PROCHLORPERAZINE EDISYLATE 10 MG/2ML IJ SOLN
10.0000 mg | Freq: Once | INTRAMUSCULAR | Status: AC
  Administered 2021-11-04: 17:00:00 10 mg via INTRAVENOUS
  Filled 2021-11-04: qty 2

## 2021-11-04 MED ORDER — PROCHLORPERAZINE MALEATE 10 MG PO TABS: 10.0000 mg | ORAL_TABLET | Freq: Two times a day (BID) | ORAL | 0 refills | Status: AC | PRN

## 2021-11-04 MED ORDER — DIPHENHYDRAMINE HCL 50 MG/ML IJ SOLN
50.0000 mg | Freq: Once | INTRAMUSCULAR | Status: AC
  Administered 2021-11-04: 17:00:00 50 mg via INTRAVENOUS
  Filled 2021-11-04: qty 1

## 2021-11-04 NOTE — ED Notes (Signed)
Patient ambulatory to bathroom.

## 2021-11-04 NOTE — ED Notes (Signed)
ED Provider at bedside. 

## 2021-11-04 NOTE — ED Triage Notes (Signed)
Pt c/o migraine x 1 week-no relied with rx and OTC meds-also c/o chronic back pain-NAD-to triage in w/c

## 2021-11-04 NOTE — Discharge Instructions (Signed)
Your history, exam, and evaluation today was consistent with an exacerbation of migraine headache.  Given your reassuring exam we agreed together to hold on labs and imaging but instead treat with medications.  After the medicines, it seems your headache has resolved and given your reassuring exam we feel you are safe for discharge home.  Please follow-up with outpatient neurology and rest and stay hydrated.  If any symptoms change or worsen, please return to the nearest emergency department.

## 2021-11-04 NOTE — ED Provider Notes (Signed)
[facility] EMERGENCY DEPARTMENT Provider Note   CSN: [id number] Arrival date & time: 11/04/21  1615     History Chief Complaint  Patient presents with   Migraine    Nicole [name] is a 55 y.o. female.  The history is provided by the patient and medical records. No language interpreter was used.  Migraine This is a chronic problem. The current episode started more than 2 days ago. The problem occurs constantly. The problem has not changed since onset.Associated symptoms include headaches. Pertinent negatives include no chest pain, no abdominal pain and no shortness of breath. Exacerbated by: bright light. Nothing relieves the symptoms. She has tried nothing for the symptoms. The treatment provided no relief.      Past Medical History:  Diagnosis Date   Anemia    Anxiety attack    Asthma    Bipolar disorder (HCC)    Chronic lower back pain    scheduled to see pain management (07/27/2018)   Depression    History of blood transfusion 12/2017; 07/27/2018   Hyperlipemia    Hypertension    Migraines    "5-6/year now" (07/27/2018)   Obesity    OSA on CPAP    inconsistent use (07/27/2018)   Panic attacks    Sciatica    Stroke (HCC) 12/2017   PICA distribution; "fully recovered" (07/27/2018)   Type II diabetes mellitus (HCC)     Patient Active Problem List   Diagnosis Date Noted   Symptomatic anemia 07/27/2018   Hematochezia 07/27/2018   Acute respiratory failure with hypoxia (HCC) 01/23/2018   Migraines 01/23/2018   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 Dec 19, 2017 01/23/2018   Obesity 01/23/2018   Hyperlipemia 01/23/2018   Diabetes mellitus type 2 in obese (HCC) 01/23/2018   OSA on CPAP 01/23/2018   Acute hypokalemia 01/23/2018   Left ventricular diastolic dysfunction 01/23/2018   Microcytic anemia 01/23/2018   Hypoxia 01/23/2018   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 01/06/2018   Gait disturbance, post-stroke  01/06/2018   Leukocytosis    Hypokalemia    Vascular headache    Hyponatremia    Acute blood loss anemia    Essential hypertension    Cerebellar cerebrovascular accident (CVA) without late effect 12/23/2017   Cytotoxic brain edema (HCC) 12/20/2017   Hydrocephalus (HCC) 12/20/2017   New cerebellar infarct (HCC) 12/19/2017   Encounter for central line placement    Nausea vomiting and diarrhea     Past Surgical History:  Procedure Laterality Date   CESAREAN SECTION  1988   ENDOMETRIAL ABLATION  2017   occasional vaginal bleeding   EYE SURGERY Left 1987   "cyst on eyelid removed"   FLEXIBLE SIGMOIDOSCOPY N/A 07/28/2018   Procedure: FLEXIBLE SIGMOIDOSCOPY;  Surgeon: Kathi Der, MD;  Location: [facility];  Service: Gastroenterology;  Laterality: N/A;   HEMORRHOID SURGERY N/A 07/29/2018   Procedure: HEMORRHOIDECTOMY;  Surgeon: Violeta Gelinas, MD;  Location: [facility] OR;  Service: General;  Laterality: N/A;   TUBAL LIGATION  1988     OB History   No obstetric history on file.     Family History  Problem Relation Age of Onset   Hypertension Mother    Hypertension Father     Social History   Tobacco Use   Smoking status: Former    Types: Cigarettes   Smokeless tobacco: Never   Tobacco comments:    07/27/2018 "stopped in 2011"  Vaping Use   Vaping Use: Never used  Substance  Use Topics   Alcohol use: Not Currently   Drug use: Never    Home Medications Prior to Admission medications   Medication Sig Start Date End Date Taking? Authorizing Provider  acetaminophen (TYLENOL 8 HOUR) 650 MG CR tablet Take 1 tablet (650 mg total) by mouth every 8 (eight) hours as needed for pain. 01/07/21   Mannie Stabile, PA-C  acetaminophen (TYLENOL) 325 MG tablet Take 650 mg by mouth every 6 (six) hours as needed for mild pain.    [provider]  albuterol (VENTOLIN HFA) 108 (90 Base) MCG/ACT inhaler Inhale 2 puffs into the lungs every 4 (four) hours as needed for wheezing.  03/21/17   [provider]  amoxicillin (AMOXIL) 500 MG capsule Take 1 capsule (500 mg total) by mouth 3 (three) times daily. 10/06/21   Tomi Bamberger, PA-C  aspirin EC 81 MG EC tablet Take 1 tablet (81 mg total) by mouth daily. Patient taking differently: Take 81 mg by mouth at bedtime. 12/28/17   Angiulli, Mcarthur Rossetti, PA-C  atorvastatin (LIPITOR) 80 MG tablet Take 1 tablet (80 mg total) by mouth daily. 12/28/17   Angiulli, Mcarthur Rossetti, PA-C  butalbital-acetaminophen-caffeine (FIORICET, ESGIC) [phone] MG tablet Take 1 tablet by mouth every 6 (six) hours as needed for headache or migraine. 12/28/17   Angiulli, Mcarthur Rossetti, PA-C  cyclobenzaprine (FLEXERIL) 10 MG tablet Take 1 tablet (10 mg total) by mouth 2 (two) times daily as needed for muscle spasms. 04/30/21   Carroll Sage, PA-C  diclofenac (VOLTAREN) 75 MG EC tablet Take 1 tablet (75 mg total) by mouth 2 (two) times daily. 12/07/19   Peyton Najjar, MD  diltiazem (CARDIZEM CD) 240 MG 24 hr capsule Take 240 mg by mouth daily. 07/15/18   [provider]  docusate sodium (COLACE) 100 MG capsule Take 1 capsule (100 mg total) by mouth 2 (two) times daily. 07/31/18   Kathlen Mody, MD  FLUoxetine (PROZAC) 20 MG tablet Take 2 tablets (40 mg total) by mouth 2 (two) times daily. 12/28/17   Angiulli, Mcarthur Rossetti, PA-C  folic acid (FOLVITE) 1 MG tablet Take 1 tablet (1 mg total) by mouth daily. 07/31/18   Kathlen Mody, MD  furosemide (LASIX) 20 MG tablet Take 20 mg by mouth 2 (two) times daily. 06/24/18   [provider]  gabapentin (NEURONTIN) 300 MG capsule Take 1 capsule (300 mg total) by mouth 3 (three) times daily. 07/26/20   Placido Sou, PA-C  hydrocortisone (ANUSOL-HC) 25 MG suppository Place 1 suppository (25 mg total) rectally 2 (two) times daily. 07/31/18   Kathlen Mody, MD  lidocaine (LIDODERM) 5 % Place 1 patch onto the skin daily. Remove & Discard patch within 12 hours or as directed by MD 10/25/17   Anselm Pancoast, PA-C   meclizine (ANTIVERT) 12.5 MG tablet Take 1 tablet (12.5 mg total) by mouth 3 (three) times daily as needed for dizziness. 12/28/17   Angiulli, Mcarthur Rossetti, PA-C  metFORMIN (GLUCOPHAGE) 500 MG tablet Take 1 tablet (500 mg total) by mouth 2 (two) times daily with a meal. 12/28/17 12/28/18  Angiulli, Mcarthur Rossetti, PA-C  methocarbamol (ROBAXIN) 500 MG tablet Take 1 tablet (500 mg total) by mouth 2 (two) times daily. 07/26/20   Placido Sou, PA-C  methocarbamol (ROBAXIN) 500 MG tablet Take 1 tablet (500 mg total) by mouth 2 (two) times daily. 01/07/21   Mannie Stabile, PA-C  metoprolol succinate (TOPROL-XL) 25 MG 24 hr tablet Take 25 mg by mouth daily.  06/24/18   [provider]  omeprazole (PRILOSEC) 20 MG capsule Take 20 mg by mouth at bedtime.    [provider]  ondansetron (ZOFRAN) 4 MG tablet Take 4 mg by mouth every 8 (eight) hours as needed for nausea or vomiting.    [provider]  OVER THE COUNTER MEDICATION CPAP    [provider]  polyethylene glycol (MIRALAX / GLYCOLAX) packet Take 17 g by mouth daily. 07/31/18   Hosie Poisson, MD  potassium chloride (K-DUR) 10 MEQ tablet Take 10 mEq by mouth daily. 06/01/18   [provider]  potassium chloride SA (K-DUR) 20 MEQ tablet Take 1 tablet (20 mEq total) by mouth 2 (two) times daily. 03/25/19   Pattricia Boss, MD  predniSONE (STERAPRED UNI-PAK 21 TAB) 10 MG (21) TBPK tablet Take by mouth daily. Take 6 tabs by mouth daily  for 2 days, then 5 tabs for 2 days, then 4 tabs for 2 days, then 3 tabs for 2 days, 2 tabs for 2 days, then 1 tab by mouth daily for 2 days 07/26/20   Rayna Sexton, PA-C  SUMAtriptan (IMITREX) 100 MG tablet Take 100 mg by mouth as needed. 06/01/18   [provider]  traMADol (ULTRAM) 50 MG tablet Take 2 tablets (100 mg total) by mouth every 6 (six) hours as needed for moderate pain. 07/31/18   Hosie Poisson, MD  witch hazel-glycerin (TUCKS) pad Apply 1 application topically 3 (three)  times daily. 07/31/18   Hosie Poisson, MD    Allergies    Morphine and related  Review of Systems   Review of Systems  Constitutional:  Negative for chills, diaphoresis, fatigue and fever.  HENT:  Negative for congestion.   Eyes:  Positive for photophobia. Negative for visual disturbance.  Respiratory:  Negative for cough, chest tightness and shortness of breath.   Cardiovascular:  Negative for chest pain.  Gastrointestinal:  Negative for abdominal pain, constipation, diarrhea, nausea and vomiting.  Genitourinary:  Negative for dysuria and flank pain.  Musculoskeletal:  Negative for back pain, neck pain and neck stiffness.  Skin:  Negative for rash and wound.  Neurological:  Positive for headaches. Negative for dizziness, speech difficulty, weakness, light-headedness and numbness.  Psychiatric/Behavioral:  Negative for agitation and confusion.   All other systems reviewed and are negative.  Physical Exam Updated Vital Signs BP (!) 142/88 (BP Location: Right Arm)    Pulse (!) 102    Temp 97.9 F (36.6 C) (Oral)    Resp 20    SpO2 98%   Physical Exam Vitals and nursing note reviewed.  Constitutional:      General: She is not in acute distress.    Appearance: She is well-developed. She is not ill-appearing, toxic-appearing or diaphoretic.  HENT:     Head: Normocephalic and atraumatic.     Nose: No congestion or rhinorrhea.     Mouth/Throat:     Mouth: Mucous membranes are moist.     Pharynx: No oropharyngeal exudate or posterior oropharyngeal erythema.  Eyes:     Extraocular Movements: Extraocular movements intact.     Conjunctiva/sclera: Conjunctivae normal.     Pupils: Pupils are equal, round, and reactive to light.  Cardiovascular:     Rate and Rhythm: Normal rate and regular rhythm.     Heart sounds: No murmur heard. Pulmonary:     Effort: Pulmonary effort is normal. No respiratory distress.     Breath sounds: Normal breath sounds. No wheezing, rhonchi or rales.  Chest:      Chest wall: No tenderness.  Abdominal:     Palpations: Abdomen is soft.     Tenderness: There is no abdominal tenderness. There is no right CVA tenderness, left CVA tenderness, guarding or rebound.  Musculoskeletal:        General: No swelling or tenderness.     Cervical back: Neck supple. No rigidity or tenderness.  Skin:    General: Skin is warm and dry.     Capillary Refill: Capillary refill takes less than 2 seconds.     Findings: No erythema.  Neurological:     General: No focal deficit present.     Mental Status: She is alert.     Sensory: No sensory deficit.     Motor: No weakness.  Psychiatric:        Mood and Affect: Mood normal.    ED Results / Procedures / Treatments   Labs (all labs ordered are listed, but only abnormal results are displayed) Labs Reviewed - No data to display  EKG None  Radiology No results found.  Procedures Procedures   Medications Ordered in ED Medications  prochlorperazine (COMPAZINE) injection 10 mg (has no administration in time range)  diphenhydrAMINE (BENADRYL) injection 50 mg (has no administration in time range)  ketorolac (TORADOL) 30 MG/ML injection 30 mg (has no administration in time range)  sodium chloride 0.9 % bolus 1,000 mL (1,000 mLs Intravenous New Bag/Given 11/04/21 1655)    ED Course  I have reviewed the triage vital signs and the nursing notes.  Pertinent labs & imaging results that were available during my care of the patient were reviewed by me and considered in my medical decision making (see chart for details).    MDM Rules/Calculators/A&P                          Nicole [name] is a 55 y.o. female with a past medical history sniffing for hypertension, hyperlipidemia, diabetes, asthma, depression, previous stroke, chronic back pain with extremity pains, and migraines who presents with headache.  According to patient, for the last week she has been having migrainous type headaches that are consistent  with prior.  She denies any new other factors including no nausea, vomiting, constipation, diarrhea, urinary changes.  She denies any neck pain or neck stiffness and reports she always has pain in her low back going towards her legs.  She reports she is scheduled for an outpatient MRI soon to further evaluate.  She denies any any fevers, chills, cough, congestion, or other infectious symptoms.  She reports that she is very photosensitive and photosensitive similar to prior migraines.  She says that she has taken Imitrex which normally works for her but has not yet helped this time.  She reports the pain is "12 out of 10".  She reports she has needed a migraine cocktail in the past and thinks that is what she needs.  She denies any other changes compared to her prior migraines.  On my exam, no focal neurologic deficits.  Pupils are symmetric and reactive normal extraocular movements.  She does have photosensitivity.  Lungs clear and chest nontender.  Abdomen nontender.  Patient otherwise well-appearing.  Clinically I do suspect she has a migrainous type headache that is causing other worsen chronic pain.  Had a shared decision made conversation with patient and we agreed to give her headache cocktail.  We also discussed getting blood work however given her lack  of nausea, vomiting, diarrhea, and previously reassuring blood work earlier this year, patient agrees to hold on blood work.  We will give her Toradol as her previous creatinine was all normal and she is amenable to this.  We will give Compazine, Benadryl, and fluids and let her relax in the dark to try to alleviate the headache.  Very low suspicion for intracranial hemorrhage, stroke, or intracranial infection based on her reassuring exam and similar symptoms to prior.  If work-up is reassuring, anticipate discharge with instructions to follow-up with outpatient neurology to discuss escalation of her outpatient headache regimen given her escalating  symptoms.  Patient agrees, anticipate reassessment.  7:56 PM On reassessment patient reports her headache is completely gone as is her back pain and leg pains.  She would like to go home.  She will call outpatient neurology groups to discuss outpatient follow-up and further headache management.  She understood return precautions and follow-up instructions and was discharged in good condition with resolution of symptoms.   Final Clinical Impression(s) / ED Diagnoses Final diagnoses:  Bad headache    Rx / DC Orders ED Discharge Orders     None      Clinical Impression: 1. Bad headache     Disposition: Discharge  Condition: Good  I have discussed the results, Dx and Tx plan with the pt(& family if present). He/she/they expressed understanding and agree(s) with the plan. Discharge instructions discussed at great length. Strict return precautions discussed and pt &/or family have verbalized understanding of the instructions. No further questions at time of discharge.    New Prescriptions   No medications on file    Follow Up: No follow-up provider specified.    Leondre Taul, Gwenyth Allegra, MD 11/04/21 2001

## 2021-11-04 NOTE — ED Notes (Signed)
Placed patient on 4L [place] due to dropping oxygen saturating. Patient uses CPAP at night and patient is sleepy due to medication. Patient oxygen saturation increased to 95%. Patient tolerating well.

## 2021-11-04 NOTE — ED Notes (Signed)
IV infiltrated, Alie, RN will attempt to get an IV

## 2022-02-09 ENCOUNTER — Ambulatory Visit: Admitting: Family Medicine

## 2022-02-09 VITALS — BP 172/112 | HR 113 | Temp 97.3°F | Ht 63.0 in | Wt 317.0 lb

## 2022-02-09 DIAGNOSIS — R5383 Other fatigue: Secondary | ICD-10-CM

## 2022-02-09 NOTE — Progress Notes (Unsigned)
Acute Office Visit  Subjective:    Patient ID: Nicole [name], female    DOB: 11/19/1965, 56 y.o.   MRN: [id number]  No chief complaint on file.   HPI Patient is in today for ***  Past Medical History:  Diagnosis Date   Anemia    Anxiety attack    Asthma    Bipolar disorder (HCC)    Chronic lower back pain    scheduled to see pain management (07/27/2018)   Depression    History of blood transfusion 12/2017; 07/27/2018   Hyperlipemia    Hypertension    Migraines    "5-6/year now" (07/27/2018)   Obesity    OSA on CPAP    inconsistent use (07/27/2018)   Panic attacks    Sciatica    Stroke (HCC) 12/2017   PICA distribution; "fully recovered" (07/27/2018)   Type II diabetes mellitus (HCC)     Past Surgical History:  Procedure Laterality Date   CESAREAN SECTION  1988   ENDOMETRIAL ABLATION  2017   occasional vaginal bleeding   EYE SURGERY Left 1987   "cyst on eyelid removed"   FLEXIBLE SIGMOIDOSCOPY N/A 07/28/2018   Procedure: FLEXIBLE SIGMOIDOSCOPY;  Surgeon: Kathi Der, MD;  Location: [facility];  Service: Gastroenterology;  Laterality: N/A;   HEMORRHOID SURGERY N/A 07/29/2018   Procedure: HEMORRHOIDECTOMY;  Surgeon: Violeta Gelinas, MD;  Location: [facility] OR;  Service: General;  Laterality: N/A;   TUBAL LIGATION  1988    Family History  Problem Relation Age of Onset   Hypertension Mother    Hypertension Father     Social History   Socioeconomic History   Marital status: Legally Separated    Spouse name: Not on file   Number of children: Not on file   Years of education: Not on file   Highest education level: Not on file  Occupational History   Occupation: stay at home   Tobacco Use   Smoking status: Former    Types: Cigarettes   Smokeless tobacco: Never   Tobacco comments:    07/27/2018 "stopped in 2011"  Vaping Use   Vaping Use: Never used  Substance and Sexual Activity   Alcohol use: Not Currently   Drug use: Never   Sexual activity: Not  Currently    Birth control/protection: Surgical  Other Topics Concern   Not on file  Social History Narrative   Not on file   Social Determinants of Health   Financial Resource Strain: Not on file  Food Insecurity: Not on file  Transportation Needs: Not on file  Physical Activity: Not on file  Stress: Not on file  Social Connections: Not on file  Intimate Partner Violence: Not on file    Outpatient Medications Prior to Visit  Medication Sig Dispense Refill   acetaminophen (TYLENOL 8 HOUR) 650 MG CR tablet Take 1 tablet (650 mg total) by mouth every 8 (eight) hours as needed for pain. 15 tablet 0   acetaminophen (TYLENOL) 325 MG tablet Take 650 mg by mouth every 6 (six) hours as needed for mild pain.     albuterol (VENTOLIN HFA) 108 (90 Base) MCG/ACT inhaler Inhale 2 puffs into the lungs every 4 (four) hours as needed for wheezing.     amoxicillin (AMOXIL) 500 MG capsule Take 1 capsule (500 mg total) by mouth 3 (three) times daily. 21 capsule 0   aspirin EC 81 MG EC tablet Take 1 tablet (81 mg total) by mouth daily. (Patient taking differently: Take 81 mg by  mouth at bedtime.)     atorvastatin (LIPITOR) 80 MG tablet Take 1 tablet (80 mg total) by mouth daily. 30 tablet 1   butalbital-acetaminophen-caffeine (FIORICET, ESGIC) 50-325-40 MG tablet Take 1 tablet by mouth every 6 (six) hours as needed for headache or migraine. 20 tablet 0   cyclobenzaprine (FLEXERIL) 10 MG tablet Take 1 tablet (10 mg total) by mouth 2 (two) times daily as needed for muscle spasms. 20 tablet 0   diclofenac (VOLTAREN) 75 MG EC tablet Take 1 tablet (75 mg total) by mouth 2 (two) times daily. 30 tablet 0   diltiazem (CARDIZEM CD) 240 MG 24 hr capsule Take 240 mg by mouth daily.  0   docusate sodium (COLACE) 100 MG capsule Take 1 capsule (100 mg total) by mouth 2 (two) times daily. 10 capsule 0   FLUoxetine (PROZAC) 20 MG tablet Take 2 tablets (40 mg total) by mouth 2 (two) times daily. 60 tablet 3   folic acid  (FOLVITE) 1 MG tablet Take 1 tablet (1 mg total) by mouth daily. 30 tablet 0   furosemide (LASIX) 20 MG tablet Take 20 mg by mouth 2 (two) times daily.  0   gabapentin (NEURONTIN) 300 MG capsule Take 1 capsule (300 mg total) by mouth 3 (three) times daily. 30 capsule 0   hydrocortisone (ANUSOL-HC) 25 MG suppository Place 1 suppository (25 mg total) rectally 2 (two) times daily. 12 suppository 0   lidocaine (LIDODERM) 5 % Place 1 patch onto the skin daily. Remove & Discard patch within 12 hours or as directed by MD 30 patch 0   meclizine (ANTIVERT) 12.5 MG tablet Take 1 tablet (12.5 mg total) by mouth 3 (three) times daily as needed for dizziness. 60 tablet 0   metFORMIN (GLUCOPHAGE) 500 MG tablet Take 1 tablet (500 mg total) by mouth 2 (two) times daily with a meal. 60 tablet 11   methocarbamol (ROBAXIN) 500 MG tablet Take 1 tablet (500 mg total) by mouth 2 (two) times daily. 12 tablet 0   methocarbamol (ROBAXIN) 500 MG tablet Take 1 tablet (500 mg total) by mouth 2 (two) times daily. 20 tablet 0   metoprolol succinate (TOPROL-XL) 25 MG 24 hr tablet Take 25 mg by mouth daily.  1   omeprazole (PRILOSEC) 20 MG capsule Take 20 mg by mouth at bedtime.     ondansetron (ZOFRAN) 4 MG tablet Take 4 mg by mouth every 8 (eight) hours as needed for nausea or vomiting.     OVER THE COUNTER MEDICATION CPAP     polyethylene glycol (MIRALAX / GLYCOLAX) packet Take 17 g by mouth daily. 14 each 0   potassium chloride (K-DUR) 10 MEQ tablet Take 10 mEq by mouth daily.  0   potassium chloride SA (K-DUR) 20 MEQ tablet Take 1 tablet (20 mEq total) by mouth 2 (two) times daily. 6 tablet 0   predniSONE (STERAPRED UNI-PAK 21 TAB) 10 MG (21) TBPK tablet Take by mouth daily. Take 6 tabs by mouth daily  for 2 days, then 5 tabs for 2 days, then 4 tabs for 2 days, then 3 tabs for 2 days, 2 tabs for 2 days, then 1 tab by mouth daily for 2 days 42 tablet 0   prochlorperazine (COMPAZINE) 10 MG tablet Take 1 tablet (10 mg total) by  mouth 2 (two) times daily as needed for nausea or vomiting. 10 tablet 0   SUMAtriptan (IMITREX) 100 MG tablet Take 100 mg by mouth as needed.  12  traMADol (ULTRAM) 50 MG tablet Take 2 tablets (100 mg total) by mouth every 6 (six) hours as needed for moderate pain. 4 tablet 0   witch hazel-glycerin (TUCKS) pad Apply 1 application topically 3 (three) times daily. 40 each 12   No facility-administered medications prior to visit.    Allergies  Allergen Reactions   Morphine And Related Palpitations    Review of Systems     Objective:    Physical Exam  There were no vitals taken for this visit. Wt Readings from Last 3 Encounters:  04/30/21 (!) 302 lb (137 kg)  01/07/21 289 lb (131.1 kg)  07/26/20 280 lb (127 kg)    Health Maintenance Due  Topic Date Due   FOOT EXAM  Never done   OPHTHALMOLOGY EXAM  Never done   URINE MICROALBUMIN  Never done   Hepatitis C Screening  Never done   PAP SMEAR-Modifier  Never done   COLONOSCOPY (Pts 45-28yrs Insurance coverage will need to be confirmed)  Never done   MAMMOGRAM  Never done   HEMOGLOBIN A1C  01/25/2019   Zoster Vaccines- Shingrix (2 of 2) 10/08/2020   COVID-19 Vaccine (4 - Booster for Pfizer series) 02/04/2021   INFLUENZA VACCINE  06/15/2021    There are no preventive care reminders to display for this patient.   Lab Results  Component Value Date   TSH 1.168 01/24/2018   Lab Results  Component Value Date   WBC 11.1 (H) 01/07/2021   HGB 10.6 (L) 01/07/2021   HCT 33.6 (L) 01/07/2021   MCV 78.5 (L) 01/07/2021   PLT 437 (H) 01/07/2021   Lab Results  Component Value Date   NA 136 01/07/2021   K 3.3 (L) 01/07/2021   CO2 27 01/07/2021   GLUCOSE 111 (H) 01/07/2021   BUN 17 01/07/2021   CREATININE 0.61 01/07/2021   BILITOT 0.8 03/25/2019   ALKPHOS 79 03/25/2019   AST 15 03/25/2019   ALT 16 03/25/2019   PROT 7.8 03/25/2019   ALBUMIN 3.9 03/25/2019   CALCIUM 9.2 01/07/2021   ANIONGAP 11 01/07/2021   Lab Results   Component Value Date   CHOL 140 12/19/2017   Lab Results  Component Value Date   HDL 34 (L) 12/19/2017   Lab Results  Component Value Date   LDLCALC 85 12/19/2017   Lab Results  Component Value Date   TRIG 107 12/19/2017   Lab Results  Component Value Date   CHOLHDL 4.1 12/19/2017   Lab Results  Component Value Date   HGBA1C 5.8 (H) 07/27/2018       Assessment & Plan:   Problem List Items Addressed This Visit       Other   Obesity   Relevant Orders   Lipid Profile   HgB A1c   TSH + free T4   Other Visit Diagnoses     Fatigue, unspecified type    -  Primary   Relevant Orders   CBC with Differential   Comprehensive Metabolic Panel (CMET)   Lipid Profile   HgB A1c   TSH + free T4        No orders of the defined types were placed in this encounter.    Alwyn Pea

## 2022-02-09 NOTE — Progress Notes (Unsigned)
Subjective:  ?  ? Nicole [name] is a 56 y.o. female who presents for evaluation of fatigue. Symptoms began several years ago. The patient feels the fatigue began with: a significant change in weight. Symptoms of her fatigue have been general malaise. Patient describes the following psychological symptoms: depression. Patient denies constipation. Symptoms have gradually worsened. Symptom severity: symptoms bothersome, but easily able to carry out all usual work/school/family activities. Previous visits for this problem: none.  ? ?Obesity ?Patient complains of obesity. Patient cites health, increased physical ability, self-image as reasons for wanting to lose weight. ? ?Obesity History ?Weight in late teens: unk lb. ?Period of greatest weight gain: 50 lb during early adult years ?Lowest adult weight: *** ?Highest adult weight: *** ?Amount of time at present weight: *** lb. ?  ? ?History of Weight Loss Efforts ?Greatest amount of weight lost: *** lb over *** months ?Amount of time that loss was maintained: *** months ?Circumstances associated with regain of weight: *** ?Successful weight loss techniques attempted: {obesity treatment:12513} ?Unsuccessful weight loss techniques attempted: {obesity treatment:12513} ? ?Current Exercise Habits walking ? ?Current Eating Habits ?Number of regular meals per day: 3 ?Number of snacking episodes per day: 5 ?Who shops for food? patient and spouse ?Who prepares food? spouse ?Who eats with patient? patient and spouse ?Binge behavior?: no ?Purge behavior? no ?Anorexic behavior? no ?Eating precipitated by stress? no ?Guilt feelings associated with eating? no ? ?Other Potential Contributing Factors ?Use of alcohol: average 0 drinks/week ?Use of medications that may cause weight gain none ?History of past abuse? none ?Psych History: obesity ?Comorbidities: cerebrovascular disease, dyslipidemias, hypertension, and osteoarthritis ? ? ?The following portions of the patient's history  were reviewed and updated as appropriate: allergies, current medications, past family history, past medical history, past social history, past surgical history, and problem list. ? ?Review of Systems ?Pertinent items noted in HPI and remainder of comprehensive ROS otherwise negative.  ?  ?Objective:  ? ? There were no vitals taken for this visit. ? ?General Appearance:    Alert, cooperative, no distress, appears stated age  ?Head:    Normocephalic, without obvious abnormality, atraumatic  ?Eyes:    PERRL, conjunctiva/corneas clear, EOM's intact, fundi  ?  benign, both eyes  ?Ears:    Normal TM's and external ear canals, both ears  ?Nose:   Nares normal, septum midline, mucosa normal, no drainage    or sinus tenderness  ?Throat:   Lips, mucosa, and tongue normal; teeth and gums normal  ?Neck:   Supple, symmetrical, trachea midline, no adenopathy;  ?  thyroid:  no enlargement/tenderness/nodules; no carotid ?  bruit or JVD  ?Back:     Symmetric, no curvature, ROM normal, no CVA tenderness  ?Lungs:     Clear to auscultation bilaterally, respirations unlabored  ?Chest Wall:    No tenderness or deformity  ? Heart:    Regular rate and rhythm, S1 and S2 normal, no murmur, rub   or gallop  ?Breast Exam:    No tenderness, masses, or nipple abnormality  ?Abdomen:     Soft, non-tender, bowel sounds active all four quadrants,  ?  no masses, no organomegaly  ?Genitalia:    Normal female without lesion, discharge or tenderness  ?Rectal:    Normal tone, normal prostate, no masses or tenderness; ?  guaiac negative stool  ?Extremities:   Extremities normal, atraumatic, no cyanosis or edema  ?Pulses:   2+ and symmetric all extremities  ?Skin:   Skin color, texture, turgor normal,  no rashes or lesions  ?Lymph nodes:   Cervical, supraclavicular, and axillary nodes normal  ?Neurologic:   CNII-XII intact, normal strength, sensation and reflexes  ?  throughout  ?  ?  ?Assessment:  ? ? Possible Chronic Fatigue Syndrome ?Obesity  ?  ?Plan:   ? ? Discussed diagnosis with patient. ?Reassured that serious underlying cause for the fatigue is very unlikely. ?See orders for lab evaluation. ?Discussed how depression can be a cause of fatigue. ?Follow up in 4 week or as needed.  ? ?Diagnoses and all orders for this visit: ? ?Fatigue, unspecified type ?-     CBC with Differential ?-     Comprehensive Metabolic Panel (CMET) ?-     Lipid Profile ?-     HgB A1c ?-     TSH + free T4 ? ?Class 3 severe obesity without serious comorbidity with body mass index (BMI) of 50.0 to 59.9 in adult, unspecified obesity type (HCC) ?-     Lipid Profile ?-     HgB A1c ?-     TSH + free T4 ? ? ? ?

## 2022-02-20 ENCOUNTER — Encounter (HOSPITAL_BASED_OUTPATIENT_CLINIC_OR_DEPARTMENT_OTHER): Payer: Self-pay

## 2022-02-20 ENCOUNTER — Emergency Department (HOSPITAL_BASED_OUTPATIENT_CLINIC_OR_DEPARTMENT_OTHER)
Admission: EM | Admit: 2022-02-20 | Discharge: 2022-02-20 | Disposition: A | Discharge status: Home / Self Care | Attending: Emergency Medicine | Admitting: Emergency Medicine

## 2022-02-20 ENCOUNTER — Other Ambulatory Visit: Payer: Self-pay

## 2022-02-20 ENCOUNTER — Emergency Department (HOSPITAL_BASED_OUTPATIENT_CLINIC_OR_DEPARTMENT_OTHER)

## 2022-02-20 DIAGNOSIS — G43909 Migraine, unspecified, not intractable, without status migrainosus: Secondary | ICD-10-CM | POA: Diagnosis not present

## 2022-02-20 DIAGNOSIS — R519 Headache, unspecified: Secondary | ICD-10-CM | POA: Diagnosis present

## 2022-02-20 DIAGNOSIS — Z7982 Long term (current) use of aspirin: Secondary | ICD-10-CM | POA: Diagnosis not present

## 2022-02-20 DIAGNOSIS — G43009 Migraine without aura, not intractable, without status migrainosus: Secondary | ICD-10-CM

## 2022-02-20 MED ORDER — DIPHENHYDRAMINE HCL 50 MG/ML IJ SOLN
12.5000 mg | Freq: Once | INTRAMUSCULAR | Status: AC
  Administered 2022-02-20: 12.5 mg via INTRAVENOUS
  Filled 2022-02-20: qty 1

## 2022-02-20 MED ORDER — PROCHLORPERAZINE EDISYLATE 10 MG/2ML IJ SOLN
10.0000 mg | Freq: Once | INTRAMUSCULAR | Status: AC
  Administered 2022-02-20: 10 mg via INTRAVENOUS
  Filled 2022-02-20: qty 2

## 2022-02-20 MED ORDER — SODIUM CHLORIDE 0.9 % IV BOLUS
500.0000 mL | Freq: Once | INTRAVENOUS | Status: AC
  Administered 2022-02-20: 500 mL via INTRAVENOUS

## 2022-02-20 MED ORDER — KETOROLAC TROMETHAMINE 30 MG/ML IJ SOLN
30.0000 mg | Freq: Once | INTRAMUSCULAR | Status: AC
  Administered 2022-02-20: 30 mg via INTRAVENOUS
  Filled 2022-02-20: qty 1

## 2022-02-20 MED ORDER — FENTANYL CITRATE PF 50 MCG/ML IJ SOSY
50.0000 ug | PREFILLED_SYRINGE | Freq: Once | INTRAMUSCULAR | Status: AC
  Administered 2022-02-20: 50 ug via INTRAVENOUS
  Filled 2022-02-20: qty 1

## 2022-02-20 NOTE — Progress Notes (Signed)
Placed patient on 3 liter nasal cannula due to patient's SPO2 dropping to 84% at times when she falls to sleep.  Placed patient on 3 liter nasal cannula.  Patient's SPO2 is 94%.  RT will continue to monitor. ?

## 2022-02-20 NOTE — Discharge Instructions (Signed)
You were seen in the emergency department for evaluation of a headache.  You had a CAT scan that did not show any acute findings.  Your symptoms improved with some medication.  Please drink plenty of fluids and follow-up with your primary care doctor.  Return if any worsening or concerning symptoms ?

## 2022-02-20 NOTE — ED Triage Notes (Addendum)
C/o migraine x 4 days. N/V, light sensitivity. Hx of same. Has taken imitrex without relief. Slipped and hit head in shower last night, not on thinners. ?

## 2022-02-20 NOTE — ED Provider Notes (Signed)
?[facility] EMERGENCY DEPARTMENT ?Provider Note ? ? ?CSN: [id number] ?Arrival date & time: 02/20/22  1627 ? ?  ? ?History ? ?Chief Complaint  ?Patient presents with  ? Migraine  ? ? ?Nicole [name] is a 56 y.o. female.  She is here with a complaint of a migraine headache its been going on for 4 days.  History of same.  Associated with nausea and vomiting.  No fevers or chills.  Photophobia no blurry vision or double vision.  She also complaining of some worsening of her chronic knee and back pain.  She did have a recent fall where she slipped and hit her head yesterday. ? ?The history is provided by the patient.  ?Migraine ?This is a recurrent problem. The current episode started more than 2 days ago. The problem occurs constantly. The problem has not changed since onset.Associated symptoms include headaches. Pertinent negatives include no chest pain, no abdominal pain and no shortness of breath. Exacerbated by: Sherlynn Stalls. Nothing relieves the symptoms. She has tried rest for the symptoms. The treatment provided no relief.  ? ?  ? ?Home Medications ?Prior to Admission medications   ?Medication Sig Start Date End Date Taking? Authorizing Provider  ?acetaminophen (TYLENOL 8 HOUR) 650 MG CR tablet Take 1 tablet (650 mg total) by mouth every 8 (eight) hours as needed for pain. 01/07/21   Mannie Stabile, PA-C  ?acetaminophen (TYLENOL) 325 MG tablet Take 650 mg by mouth every 6 (six) hours as needed for mild pain.    [provider]  ?albuterol (VENTOLIN HFA) 108 (90 Base) MCG/ACT inhaler Inhale 2 puffs into the lungs every 4 (four) hours as needed for wheezing. 03/21/17   [provider]  ?amoxicillin (AMOXIL) 500 MG capsule Take 1 capsule (500 mg total) by mouth 3 (three) times daily. 10/06/21   Tomi Bamberger, PA-C  ?aspirin EC 81 MG EC tablet Take 1 tablet (81 mg total) by mouth daily. ?Patient taking differently: Take 81 mg by mouth at bedtime. 12/28/17   Angiulli, Mcarthur Rossetti, PA-C   ?atorvastatin (LIPITOR) 80 MG tablet Take 1 tablet (80 mg total) by mouth daily. 12/28/17   Angiulli, Mcarthur Rossetti, PA-C  ?butalbital-acetaminophen-caffeine (FIORICET, ESGIC) 50-325-40 MG tablet Take 1 tablet by mouth every 6 (six) hours as needed for headache or migraine. 12/28/17   Angiulli, Mcarthur Rossetti, PA-C  ?cyclobenzaprine (FLEXERIL) 10 MG tablet Take 1 tablet (10 mg total) by mouth 2 (two) times daily as needed for muscle spasms. 04/30/21   Carroll Sage, PA-C  ?diclofenac (VOLTAREN) 75 MG EC tablet Take 1 tablet (75 mg total) by mouth 2 (two) times daily. 12/07/19   Peyton Najjar, MD  ?diltiazem (CARDIZEM CD) 240 MG 24 hr capsule Take 240 mg by mouth daily. 07/15/18   [provider]  ?docusate sodium (COLACE) 100 MG capsule Take 1 capsule (100 mg total) by mouth 2 (two) times daily. 07/31/18   Kathlen Mody, MD  ?FLUoxetine (PROZAC) 20 MG tablet Take 2 tablets (40 mg total) by mouth 2 (two) times daily. 12/28/17   Angiulli, Mcarthur Rossetti, PA-C  ?folic acid (FOLVITE) 1 MG tablet Take 1 tablet (1 mg total) by mouth daily. 07/31/18   Kathlen Mody, MD  ?furosemide (LASIX) 20 MG tablet Take 20 mg by mouth 2 (two) times daily. 06/24/18   [provider]  ?gabapentin (NEURONTIN) 300 MG capsule Take 1 capsule (300 mg total) by mouth 3 (three) times daily. 07/26/20   Placido Sou, PA-C  ?hydrocortisone (ANUSOL-HC)  25 MG suppository Place 1 suppository (25 mg total) rectally 2 (two) times daily. 07/31/18   Kathlen Mody, MD  ?lidocaine (LIDODERM) 5 % Place 1 patch onto the skin daily. Remove & Discard patch within 12 hours or as directed by MD 10/25/17   Harolyn Rutherford C, PA-C  ?meclizine (ANTIVERT) 12.5 MG tablet Take 1 tablet (12.5 mg total) by mouth 3 (three) times daily as needed for dizziness. 12/28/17   Angiulli, Mcarthur Rossetti, PA-C  ?metFORMIN (GLUCOPHAGE) 500 MG tablet Take 1 tablet (500 mg total) by mouth 2 (two) times daily with a meal. 12/28/17 12/28/18  Angiulli, Mcarthur Rossetti, PA-C  ?methocarbamol (ROBAXIN) 500  MG tablet Take 1 tablet (500 mg total) by mouth 2 (two) times daily. 07/26/20   Placido Sou, PA-C  ?methocarbamol (ROBAXIN) 500 MG tablet Take 1 tablet (500 mg total) by mouth 2 (two) times daily. 01/07/21   Mannie Stabile, PA-C  ?metoprolol succinate (TOPROL-XL) 25 MG 24 hr tablet Take 25 mg by mouth daily. 06/24/18   [provider]  ?omeprazole (PRILOSEC) 20 MG capsule Take 20 mg by mouth at bedtime.    [provider]  ?ondansetron (ZOFRAN) 4 MG tablet Take 4 mg by mouth every 8 (eight) hours as needed for nausea or vomiting.    [provider]  ?OVER THE COUNTER MEDICATION CPAP    [provider]  ?polyethylene glycol (MIRALAX / GLYCOLAX) packet Take 17 g by mouth daily. 07/31/18   Kathlen Mody, MD  ?potassium chloride (K-DUR) 10 MEQ tablet Take 10 mEq by mouth daily. 06/01/18   [provider]  ?potassium chloride SA (K-DUR) 20 MEQ tablet Take 1 tablet (20 mEq total) by mouth 2 (two) times daily. 03/25/19   Margarita Grizzle, MD  ?predniSONE (STERAPRED UNI-PAK 21 TAB) 10 MG (21) TBPK tablet Take by mouth daily. Take 6 tabs by mouth daily  for 2 days, then 5 tabs for 2 days, then 4 tabs for 2 days, then 3 tabs for 2 days, 2 tabs for 2 days, then 1 tab by mouth daily for 2 days 07/26/20   Placido Sou, PA-C  ?prochlorperazine (COMPAZINE) 10 MG tablet Take 1 tablet (10 mg total) by mouth 2 (two) times daily as needed for nausea or vomiting. 11/04/21   Tegeler, Canary Brim, MD  ?SUMAtriptan (IMITREX) 100 MG tablet Take 100 mg by mouth as needed. 06/01/18   [provider]  ?traMADol (ULTRAM) 50 MG tablet Take 2 tablets (100 mg total) by mouth every 6 (six) hours as needed for moderate pain. 07/31/18   Kathlen Mody, MD  ?witch hazel-glycerin (TUCKS) pad Apply 1 application topically 3 (three) times daily. 07/31/18   Kathlen Mody, MD  ?   ? ?Allergies    ?Morphine and related   ? ?Review of Systems   ?Review of Systems  ?Constitutional:  Negative for fever.   ?Respiratory:  Negative for shortness of breath.   ?Cardiovascular:  Negative for chest pain.  ?Gastrointestinal:  Positive for nausea and vomiting. Negative for abdominal pain.  ?Musculoskeletal:  Positive for back pain. Negative for neck pain.  ?Neurological:  Positive for headaches.  ? ?Physical Exam ?Updated Vital Signs ?BP (!) 149/105 (BP Location: Right Wrist)   Pulse (!) 111   Temp 97.9 ?F (36.6 ?C) (Oral)   Resp 18   Ht 5\' 3"  (1.6 m)   Wt (!) 143.8 kg   SpO2 93%   BMI 56.15 kg/m?  ?Physical Exam ?Vitals and nursing note reviewed.  ?Constitutional:   ?  General: She is not in acute distress. ?   Appearance: Normal appearance. She is well-developed.  ?HENT:  ?   Head: Normocephalic and atraumatic.  ?Eyes:  ?   Conjunctiva/sclera: Conjunctivae normal.  ?Cardiovascular:  ?   Rate and Rhythm: Normal rate and regular rhythm.  ?   Heart sounds: No murmur heard. ?Pulmonary:  ?   Effort: Pulmonary effort is normal. No respiratory distress.  ?   Breath sounds: Normal breath sounds.  ?Abdominal:  ?   Palpations: Abdomen is soft.  ?   Tenderness: There is no abdominal tenderness.  ?Musculoskeletal:     ?   General: No swelling.  ?   Cervical back: Neck supple.  ?Skin: ?   General: Skin is warm and dry.  ?   Capillary Refill: Capillary refill takes less than 2 seconds.  ?Neurological:  ?   General: No focal deficit present.  ?   Mental Status: She is alert and oriented to person, place, and time.  ?   Cranial Nerves: No cranial nerve deficit.  ?   Sensory: No sensory deficit.  ?   Motor: No weakness.  ? ? ?ED Results / Procedures / Treatments   ?Labs ?(all labs ordered are listed, but only abnormal results are displayed) ?Labs Reviewed - No data to display ? ?EKG ?None ? ?Radiology ?CT Head Wo Contrast ? ?Result Date: 02/20/2022 ?CLINICAL DATA:  Head trauma, abnormal mental status (Age 40-64y) EXAM: CT HEAD WITHOUT CONTRAST TECHNIQUE: Contiguous axial images were obtained from the base of the skull through the  vertex without intravenous contrast. RADIATION DOSE REDUCTION: This exam was performed according to the departmental dose-optimization program which includes automated exposure control, adjustment of the mA and/or kV a

## 2022-02-24 ENCOUNTER — Ambulatory Visit: Admitting: Family Medicine

## 2022-02-24 VITALS — BP 139/116 | HR 113 | Temp 97.5°F | Resp 12 | Ht 63.0 in | Wt 317.8 lb

## 2022-02-24 DIAGNOSIS — R5383 Other fatigue: Secondary | ICD-10-CM

## 2022-02-25 LAB — CBC WITH DIFFERENTIAL/PLATELET
Basophils Absolute: 0.1 10*3/uL (ref 0.0–0.2)
Basos: 1 %
EOS (ABSOLUTE): 0 10*3/uL (ref 0.0–0.4)
Eos: 0 %
Hematocrit: 40.9 % (ref 34.0–46.6)
Hemoglobin: 12.9 g/dL (ref 11.1–15.9)
Immature Grans (Abs): 0 10*3/uL (ref 0.0–0.1)
Immature Granulocytes: 0 %
Lymphocytes Absolute: 2 10*3/uL (ref 0.7–3.1)
Lymphs: 21 %
MCH: 26.5 pg — ABNORMAL LOW (ref 26.6–33.0)
MCHC: 31.5 g/dL (ref 31.5–35.7)
MCV: 84 fL (ref 79–97)
Monocytes Absolute: 0.8 10*3/uL (ref 0.1–0.9)
Monocytes: 9 %
Neutrophils Absolute: 6.8 10*3/uL (ref 1.4–7.0)
Neutrophils: 69 %
Platelets: 386 10*3/uL (ref 150–450)
RBC: 4.87 x10E6/uL (ref 3.77–5.28)
RDW: 14.1 % (ref 11.7–15.4)
WBC: 9.7 10*3/uL (ref 3.4–10.8)

## 2022-02-25 LAB — TSH+FREE T4
Free T4: 1.17 ng/dL (ref 0.82–1.77)
TSH: 1.55 u[IU]/mL (ref 0.450–4.500)

## 2022-02-25 LAB — COMPREHENSIVE METABOLIC PANEL
ALT: 37 IU/L — ABNORMAL HIGH (ref 0–32)
AST: 31 IU/L (ref 0–40)
Albumin/Globulin Ratio: 1.3 (ref 1.2–2.2)
Albumin: 4.3 g/dL (ref 3.8–4.9)
Alkaline Phosphatase: 113 IU/L (ref 44–121)
BUN/Creatinine Ratio: 29 — ABNORMAL HIGH (ref 9–23)
BUN: 22 mg/dL (ref 6–24)
Bilirubin Total: 0.4 mg/dL (ref 0.0–1.2)
CO2: 29 mmol/L (ref 20–29)
Calcium: 10.4 mg/dL — ABNORMAL HIGH (ref 8.7–10.2)
Chloride: 99 mmol/L (ref 96–106)
Creatinine, Ser: 0.75 mg/dL (ref 0.57–1.00)
Globulin, Total: 3.4 g/dL (ref 1.5–4.5)
Glucose: 141 mg/dL — ABNORMAL HIGH (ref 70–99)
Potassium: 4.8 mmol/L (ref 3.5–5.2)
Sodium: 144 mmol/L (ref 134–144)
Total Protein: 7.7 g/dL (ref 6.0–8.5)
eGFR: 95 mL/min/{1.73_m2} (ref >59)

## 2022-02-25 LAB — LIPID PANEL W/O CHOL/HDL RATIO
Cholesterol, Total: 178 mg/dL (ref 100–199)
HDL: 49 mg/dL (ref >39)
LDL Chol Calc (NIH): 108 mg/dL — ABNORMAL HIGH (ref 0–99)
Triglycerides: 118 mg/dL (ref 0–149)
VLDL Cholesterol Cal: 21 mg/dL (ref 5–40)

## 2022-03-12 ENCOUNTER — Ambulatory Visit: Admitting: Family Medicine

## 2022-03-26 ENCOUNTER — Ambulatory Visit: Admitting: Family Medicine

## 2022-04-10 ENCOUNTER — Other Ambulatory Visit: Payer: Self-pay

## 2022-04-10 ENCOUNTER — Encounter (HOSPITAL_BASED_OUTPATIENT_CLINIC_OR_DEPARTMENT_OTHER): Payer: Self-pay

## 2022-04-10 ENCOUNTER — Emergency Department (HOSPITAL_BASED_OUTPATIENT_CLINIC_OR_DEPARTMENT_OTHER)
Admission: EM | Admit: 2022-04-10 | Discharge: 2022-04-10 | Disposition: A | Discharge status: Home / Self Care | Attending: Emergency Medicine | Admitting: Emergency Medicine

## 2022-04-10 DIAGNOSIS — Z7982 Long term (current) use of aspirin: Secondary | ICD-10-CM | POA: Insufficient documentation

## 2022-04-10 DIAGNOSIS — H53149 Visual discomfort, unspecified: Secondary | ICD-10-CM | POA: Diagnosis not present

## 2022-04-10 DIAGNOSIS — R519 Headache, unspecified: Secondary | ICD-10-CM | POA: Diagnosis present

## 2022-04-10 DIAGNOSIS — M791 Myalgia, unspecified site: Secondary | ICD-10-CM | POA: Insufficient documentation

## 2022-04-10 DIAGNOSIS — G43101 Migraine with aura, not intractable, with status migrainosus: Secondary | ICD-10-CM | POA: Diagnosis not present

## 2022-04-10 DIAGNOSIS — R7401 Elevation of levels of liver transaminase levels: Secondary | ICD-10-CM | POA: Insufficient documentation

## 2022-04-10 LAB — COMPREHENSIVE METABOLIC PANEL
ALT: 26 U/L (ref 0–44)
AST: 26 U/L (ref 15–41)
Albumin: 3.4 g/dL — ABNORMAL LOW (ref 3.5–5.0)
Alkaline Phosphatase: 83 U/L (ref 38–126)
Anion gap: 8 (ref 5–15)
BUN: 16 mg/dL (ref 6–20)
CO2: 30 mmol/L (ref 22–32)
Calcium: 9.1 mg/dL (ref 8.9–10.3)
Chloride: 98 mmol/L (ref 98–111)
Creatinine, Ser: 0.6 mg/dL (ref 0.44–1.00)
GFR, Estimated: >60 mL/min (ref >60)
Glucose, Bld: 118 mg/dL — ABNORMAL HIGH (ref 70–99)
Potassium: 3.6 mmol/L (ref 3.5–5.1)
Sodium: 136 mmol/L (ref 135–145)
Total Bilirubin: 0.6 mg/dL (ref 0.3–1.2)
Total Protein: 7.8 g/dL (ref 6.5–8.1)

## 2022-04-10 LAB — CBC WITH DIFFERENTIAL/PLATELET
Abs Immature Granulocytes: 0.03 10*3/uL (ref 0.00–0.07)
Basophils Absolute: 0.1 10*3/uL (ref 0.0–0.1)
Basophils Relative: 1 %
Eosinophils Absolute: 0 10*3/uL (ref 0.0–0.5)
Eosinophils Relative: 0 %
HCT: 37.3 % (ref 36.0–46.0)
Hemoglobin: 11.9 g/dL — ABNORMAL LOW (ref 12.0–15.0)
Immature Granulocytes: 0 %
Lymphocytes Relative: 20 %
Lymphs Abs: 1.6 10*3/uL (ref 0.7–4.0)
MCH: 26.5 pg (ref 26.0–34.0)
MCHC: 31.9 g/dL (ref 30.0–36.0)
MCV: 83.1 fL (ref 80.0–100.0)
Monocytes Absolute: 0.6 10*3/uL (ref 0.1–1.0)
Monocytes Relative: 7 %
Neutro Abs: 5.9 10*3/uL (ref 1.7–7.7)
Neutrophils Relative %: 72 %
Platelets: 355 10*3/uL (ref 150–400)
RBC: 4.49 MIL/uL (ref 3.87–5.11)
RDW: 14.2 % (ref 11.5–15.5)
WBC: 8.2 10*3/uL (ref 4.0–10.5)
nRBC: 0 % (ref 0.0–0.2)

## 2022-04-10 LAB — BRAIN NATRIURETIC PEPTIDE: B Natriuretic Peptide: 56 pg/mL (ref 0.0–100.0)

## 2022-04-10 MED ORDER — KETOROLAC TROMETHAMINE 15 MG/ML IJ SOLN
15.0000 mg | Freq: Once | INTRAMUSCULAR | Status: AC
  Administered 2022-04-10: 15 mg via INTRAVENOUS
  Filled 2022-04-10: qty 1

## 2022-04-10 MED ORDER — DIPHENHYDRAMINE HCL 50 MG/ML IJ SOLN
25.0000 mg | Freq: Once | INTRAMUSCULAR | Status: AC
  Administered 2022-04-10: 25 mg via INTRAVENOUS
  Filled 2022-04-10: qty 1

## 2022-04-10 MED ORDER — PROCHLORPERAZINE EDISYLATE 10 MG/2ML IJ SOLN
10.0000 mg | Freq: Once | INTRAMUSCULAR | Status: AC
  Administered 2022-04-10: 10 mg via INTRAVENOUS
  Filled 2022-04-10: qty 2

## 2022-04-10 MED ORDER — DEXAMETHASONE 4 MG PO TABS
10.0000 mg | ORAL_TABLET | Freq: Once | ORAL | Status: AC
  Administered 2022-04-10: 10 mg via ORAL
  Filled 2022-04-10: qty 3

## 2022-04-10 NOTE — ED Provider Notes (Signed)
[facility] EMERGENCY DEPARTMENT Provider Note   CSN: [id number] Arrival date & time: 04/10/22  1124     History  Chief Complaint  Patient presents with   Migraine   Generalized Body Aches    Nicole [name] is a 56 y.o. female.  56 yo F with a chief complaint of a headache.  This been ongoing for least a month.  She was seen in the ER and had some transient improvement with a headache cocktail.  She also feels like she has had swelling to her legs.  Initially tells me that this is just recently over the week and then later tells me that she had pain in her legs for a really long time.  She denies one-sided numbness or weakness denies difficulty speech or swallowing.  She is having some body aches which is typical with her headaches.  Photophobia.   Migraine      Home Medications Prior to Admission medications   Medication Sig Start Date End Date Taking? Authorizing Provider  acetaminophen (TYLENOL 8 HOUR) 650 MG CR tablet Take 1 tablet (650 mg total) by mouth every 8 (eight) hours as needed for pain. Patient not taking: Reported on 02/24/2022 01/07/21   Mannie Stabile, PA-C  acetaminophen (TYLENOL) 325 MG tablet Take 650 mg by mouth every 6 (six) hours as needed for mild pain. Patient not taking: Reported on 02/24/2022    [provider]  albuterol (VENTOLIN HFA) 108 (90 Base) MCG/ACT inhaler Inhale 2 puffs into the lungs every 4 (four) hours as needed for wheezing. 03/21/17   [provider]  amoxicillin (AMOXIL) 500 MG capsule Take 1 capsule (500 mg total) by mouth 3 (three) times daily. Patient not taking: Reported on 02/24/2022 10/06/21   Tomi Bamberger, PA-C  aspirin EC 81 MG EC tablet Take 1 tablet (81 mg total) by mouth daily. Patient not taking: Reported on 02/24/2022 12/28/17   Angiulli, Mcarthur Rossetti, PA-C  atorvastatin (LIPITOR) 80 MG tablet Take 1 tablet (80 mg total) by mouth daily. 12/28/17   Angiulli, Mcarthur Rossetti, PA-C   butalbital-acetaminophen-caffeine (FIORICET, ESGIC) [phone] MG tablet Take 1 tablet by mouth every 6 (six) hours as needed for headache or migraine. 12/28/17   Angiulli, Mcarthur Rossetti, PA-C  cyclobenzaprine (FLEXERIL) 10 MG tablet Take 1 tablet (10 mg total) by mouth 2 (two) times daily as needed for muscle spasms. Patient not taking: Reported on 02/24/2022 04/30/21   Carroll Sage, PA-C  diclofenac (VOLTAREN) 75 MG EC tablet Take 1 tablet (75 mg total) by mouth 2 (two) times daily. 12/07/19   Peyton Najjar, MD  diltiazem (CARDIZEM CD) 240 MG 24 hr capsule Take 240 mg by mouth daily. 07/15/18   [provider]  docusate sodium (COLACE) 100 MG capsule Take 1 capsule (100 mg total) by mouth 2 (two) times daily. 07/31/18   Kathlen Mody, MD  FLUoxetine (PROZAC) 20 MG tablet Take 2 tablets (40 mg total) by mouth 2 (two) times daily. 12/28/17   Angiulli, Mcarthur Rossetti, PA-C  folic acid (FOLVITE) 1 MG tablet Take 1 tablet (1 mg total) by mouth daily. 07/31/18   Kathlen Mody, MD  furosemide (LASIX) 20 MG tablet Take 20 mg by mouth 2 (two) times daily. 06/24/18   [provider]  gabapentin (NEURONTIN) 300 MG capsule Take 1 capsule (300 mg total) by mouth 3 (three) times daily. 07/26/20   Placido Sou, PA-C  hydrocortisone (ANUSOL-HC) 25 MG suppository Place 1 suppository (25 mg total) rectally  2 (two) times daily. 07/31/18   Kathlen Mody, MD  lidocaine (LIDODERM) 5 % Place 1 patch onto the skin daily. Remove & Discard patch within 12 hours or as directed by MD 10/25/17   Anselm Pancoast, PA-C  meclizine (ANTIVERT) 12.5 MG tablet Take 1 tablet (12.5 mg total) by mouth 3 (three) times daily as needed for dizziness. 12/28/17   Angiulli, Mcarthur Rossetti, PA-C  metFORMIN (GLUCOPHAGE) 500 MG tablet Take 1 tablet (500 mg total) by mouth 2 (two) times daily with a meal. 12/28/17 12/28/18  Angiulli, Mcarthur Rossetti, PA-C  methocarbamol (ROBAXIN) 500 MG tablet Take 1 tablet (500 mg total) by mouth 2 (two) times daily.  07/26/20   Placido Sou, PA-C  methocarbamol (ROBAXIN) 500 MG tablet Take 1 tablet (500 mg total) by mouth 2 (two) times daily. 01/07/21   Mannie Stabile, PA-C  metoprolol succinate (TOPROL-XL) 25 MG 24 hr tablet Take 25 mg by mouth daily. 06/24/18   [provider]  omeprazole (PRILOSEC) 20 MG capsule Take 20 mg by mouth at bedtime.    [provider]  ondansetron (ZOFRAN) 4 MG tablet Take 4 mg by mouth every 8 (eight) hours as needed for nausea or vomiting.    [provider]  OVER THE COUNTER MEDICATION CPAP    [provider]  polyethylene glycol (MIRALAX / GLYCOLAX) packet Take 17 g by mouth daily. 07/31/18   Kathlen Mody, MD  potassium chloride (K-DUR) 10 MEQ tablet Take 10 mEq by mouth daily. 06/01/18   [provider]  potassium chloride SA (K-DUR) 20 MEQ tablet Take 1 tablet (20 mEq total) by mouth 2 (two) times daily. 03/25/19   Margarita Grizzle, MD  predniSONE (STERAPRED UNI-PAK 21 TAB) 10 MG (21) TBPK tablet Take by mouth daily. Take 6 tabs by mouth daily  for 2 days, then 5 tabs for 2 days, then 4 tabs for 2 days, then 3 tabs for 2 days, 2 tabs for 2 days, then 1 tab by mouth daily for 2 days 07/26/20   Placido Sou, PA-C  prochlorperazine (COMPAZINE) 10 MG tablet Take 1 tablet (10 mg total) by mouth 2 (two) times daily as needed for nausea or vomiting. 11/04/21   Tegeler, Canary Brim, MD  SUMAtriptan (IMITREX) 100 MG tablet Take 100 mg by mouth as needed. 06/01/18   [provider]  traMADol (ULTRAM) 50 MG tablet Take 2 tablets (100 mg total) by mouth every 6 (six) hours as needed for moderate pain. 07/31/18   Kathlen Mody, MD  witch hazel-glycerin (TUCKS) pad Apply 1 application topically 3 (three) times daily. 07/31/18   Kathlen Mody, MD      Allergies    Morphine and related    Review of Systems   Review of Systems  Physical Exam Updated Vital Signs BP (!) 165/102 (BP Location: Right Arm)   Pulse 97   Temp 98 F  (36.7 C) (Oral)   Resp 20   Ht  (1.6 m)   Wt (!) 143.8 kg   SpO2 97%   BMI 56.15 kg/m  Physical Exam Vitals and nursing note reviewed.  Constitutional:      General: She is not in acute distress.    Appearance: She is well-developed. She is not diaphoretic.     Comments: BMI 56  HENT:     Head: Normocephalic and atraumatic.  Eyes:     Pupils: Pupils are equal, round, and reactive to light.  Cardiovascular:     Rate and  Rhythm: Normal rate and regular rhythm.     Heart sounds: No murmur heard.   No friction rub. No gallop.  Pulmonary:     Effort: Pulmonary effort is normal.     Breath sounds: No wheezing or rales.  Abdominal:     General: There is no distension.     Palpations: Abdomen is soft.     Tenderness: There is no abdominal tenderness.  Musculoskeletal:        General: No tenderness.     Cervical back: Normal range of motion and neck supple.  Skin:    General: Skin is warm and dry.  Neurological:     Mental Status: She is alert and oriented to person, place, and time.     Cranial Nerves: Cranial nerves 2-12 are intact.     Sensory: Sensation is intact.     Motor: Motor function is intact.     Coordination: Coordination is intact.     Comments: Benign neurologic exam  Psychiatric:        Behavior: Behavior normal.    ED Results / Procedures / Treatments   Labs (all labs ordered are listed, but only abnormal results are displayed) Labs Reviewed  CBC WITH DIFFERENTIAL/PLATELET - Abnormal; Notable for the following components:      Result Value   Hemoglobin 11.9 (*)    All other components within normal limits  COMPREHENSIVE METABOLIC PANEL - Abnormal; Notable for the following components:   Glucose, Bld 118 (*)    Albumin 3.4 (*)    All other components within normal limits  BRAIN NATRIURETIC PEPTIDE    EKG None  Radiology No results found.  Procedures Procedures    Medications Ordered in ED Medications  prochlorperazine (COMPAZINE)  injection 10 mg (10 mg Intravenous Given 04/10/22 1232)  diphenhydrAMINE (BENADRYL) injection 25 mg (25 mg Intravenous Given 04/10/22 1232)  dexamethasone (DECADRON) tablet 10 mg (10 mg Oral Given 04/10/22 1231)  ketorolac (TORADOL) 15 MG/ML injection 15 mg (15 mg Intravenous Given 04/10/22 1232)    ED Course/ Medical Decision Making/ A&P                           Medical Decision Making Amount and/or Complexity of Data Reviewed Labs: ordered.  Risk Prescription drug management.   56 yo F with a chief complaint of a headache and body aches.  This has been ongoing for about a month or so.  She has a history of migraines and this feels similar.  Has an appointment to see a neurologist somewhat soon she said.  She also is concerned that her legs have been swelling.  She tells me it has been going on for about a week but later tells me that she has been having pain in her legs for quite some time.  I do not appreciate any obvious edema on my exam.  Will obtain a laboratory evaluation to assess for elevation to the BNP or electrolyte abnormality or LFT elevation.  No LFT elevation, BNP normal.  D/c home.  PCP, neuro follow up.   12:54 PM:  I have discussed the diagnosis/risks/treatment options with the patient.  Evaluation and diagnostic testing in the emergency department does not suggest an emergent condition requiring admission or immediate intervention beyond what has been performed at this time.  They will follow up with  PCP. We also discussed returning to the ED immediately if new or worsening sx occur. We discussed the sx  which are most concerning (e.g., sudden worsening pain, fever, inability to tolerate by mouth) that necessitate immediate return. Medications administered to the patient during their visit and any new prescriptions provided to the patient are listed below.  Medications given during this visit Medications  prochlorperazine (COMPAZINE) injection 10 mg (10 mg Intravenous Given  04/10/22 1232)  diphenhydrAMINE (BENADRYL) injection 25 mg (25 mg Intravenous Given 04/10/22 1232)  dexamethasone (DECADRON) tablet 10 mg (10 mg Oral Given 04/10/22 1231)  ketorolac (TORADOL) 15 MG/ML injection 15 mg (15 mg Intravenous Given 04/10/22 1232)     The patient appears reasonably screen and/or stabilized for discharge and I doubt any other medical condition or other [facility] requiring further screening, evaluation, or treatment in the ED at this time prior to discharge.          Final Clinical Impression(s) / ED Diagnoses Final diagnoses:  Migraine with aura and with status migrainosus, not intractable    Rx / DC Orders ED Discharge Orders     None         Melene Plan, DO 04/10/22 1254

## 2022-04-10 NOTE — ED Triage Notes (Signed)
Pt presents with complaints of ongoing migraine. And body aches since april

## 2022-04-10 NOTE — Discharge Instructions (Signed)
Follow up with your neurologist.
